# Patient Record
Sex: Female | Born: 1935 | Race: White | Hispanic: No | Smoking: Former smoker
Health system: Southern US, Community
[De-identification: ages and names within clinical notes are randomized; demographics above are authoritative.]

## PROBLEM LIST (undated history)

## (undated) DIAGNOSIS — I10 Essential (primary) hypertension: Secondary | ICD-10-CM

## (undated) DIAGNOSIS — H9319 Tinnitus, unspecified ear: Secondary | ICD-10-CM

## (undated) DIAGNOSIS — C50919 Malignant neoplasm of unspecified site of unspecified female breast: Secondary | ICD-10-CM

## (undated) DIAGNOSIS — M1990 Unspecified osteoarthritis, unspecified site: Secondary | ICD-10-CM

## (undated) DIAGNOSIS — M810 Age-related osteoporosis without current pathological fracture: Secondary | ICD-10-CM

## (undated) DIAGNOSIS — C801 Malignant (primary) neoplasm, unspecified: Secondary | ICD-10-CM

## (undated) DIAGNOSIS — I639 Cerebral infarction, unspecified: Secondary | ICD-10-CM

## (undated) DIAGNOSIS — IMO0002 Reserved for concepts with insufficient information to code with codable children: Secondary | ICD-10-CM

## (undated) DIAGNOSIS — I219 Acute myocardial infarction, unspecified: Secondary | ICD-10-CM

## (undated) DIAGNOSIS — F329 Major depressive disorder, single episode, unspecified: Secondary | ICD-10-CM

## (undated) DIAGNOSIS — I890 Lymphedema, not elsewhere classified: Secondary | ICD-10-CM

## (undated) DIAGNOSIS — N189 Chronic kidney disease, unspecified: Secondary | ICD-10-CM

## (undated) DIAGNOSIS — Z9221 Personal history of antineoplastic chemotherapy: Secondary | ICD-10-CM

## (undated) DIAGNOSIS — F419 Anxiety disorder, unspecified: Secondary | ICD-10-CM

## (undated) DIAGNOSIS — Z923 Personal history of irradiation: Secondary | ICD-10-CM

## (undated) DIAGNOSIS — K219 Gastro-esophageal reflux disease without esophagitis: Secondary | ICD-10-CM

## (undated) DIAGNOSIS — F32A Depression, unspecified: Secondary | ICD-10-CM

## (undated) DIAGNOSIS — R011 Cardiac murmur, unspecified: Secondary | ICD-10-CM

## (undated) HISTORY — PX: ABDOMINAL HYSTERECTOMY: SHX81

## (undated) HISTORY — DX: Malignant (primary) neoplasm, unspecified: C80.1

## (undated) HISTORY — PX: PORT-A-CATH REMOVAL: SHX5289

## (undated) HISTORY — PX: BREAST CYST EXCISION: SHX579

## (undated) HISTORY — DX: Unspecified osteoarthritis, unspecified site: M19.90

## (undated) HISTORY — PX: CARPAL TUNNEL RELEASE: SHX101

## (undated) HISTORY — PX: CHOLECYSTECTOMY: SHX55

## (undated) HISTORY — DX: Chronic kidney disease, unspecified: N18.9

## (undated) HISTORY — PX: BREAST BIOPSY: SHX20

## (undated) HISTORY — DX: Cardiac murmur, unspecified: R01.1

## (undated) HISTORY — DX: Reserved for concepts with insufficient information to code with codable children: IMO0002

## (undated) HISTORY — DX: Lymphedema, not elsewhere classified: I89.0

## (undated) HISTORY — DX: Malignant neoplasm of unspecified site of unspecified female breast: C50.919

## (undated) HISTORY — DX: Essential (primary) hypertension: I10

## (undated) HISTORY — PX: PARTIAL HYSTERECTOMY: SHX80

---

## 2005-06-05 ENCOUNTER — Ambulatory Visit: Payer: Self-pay | Admitting: Internal Medicine

## 2005-06-08 ENCOUNTER — Ambulatory Visit: Payer: Self-pay | Admitting: Internal Medicine

## 2005-06-15 ENCOUNTER — Ambulatory Visit: Payer: Self-pay | Admitting: Gastroenterology

## 2005-07-19 ENCOUNTER — Ambulatory Visit: Payer: Self-pay | Admitting: General Surgery

## 2005-12-21 ENCOUNTER — Ambulatory Visit: Payer: Self-pay | Admitting: Internal Medicine

## 2006-06-13 ENCOUNTER — Ambulatory Visit: Payer: Self-pay | Admitting: Internal Medicine

## 2006-09-10 ENCOUNTER — Ambulatory Visit: Payer: Self-pay

## 2006-12-04 ENCOUNTER — Ambulatory Visit: Payer: Self-pay | Admitting: Internal Medicine

## 2006-12-24 HISTORY — PX: JOINT REPLACEMENT: SHX530

## 2007-03-17 ENCOUNTER — Ambulatory Visit: Payer: Self-pay | Admitting: General Practice

## 2007-03-17 ENCOUNTER — Other Ambulatory Visit: Payer: Self-pay

## 2007-03-26 ENCOUNTER — Other Ambulatory Visit: Payer: Self-pay

## 2007-03-26 ENCOUNTER — Inpatient Hospital Stay: Payer: Self-pay | Admitting: General Practice

## 2007-11-13 ENCOUNTER — Ambulatory Visit: Payer: Self-pay | Admitting: General Practice

## 2008-01-29 ENCOUNTER — Ambulatory Visit: Payer: Self-pay | Admitting: Internal Medicine

## 2008-11-01 ENCOUNTER — Ambulatory Visit: Payer: Self-pay | Admitting: Gastroenterology

## 2008-12-24 DIAGNOSIS — C50919 Malignant neoplasm of unspecified site of unspecified female breast: Secondary | ICD-10-CM

## 2008-12-24 DIAGNOSIS — IMO0001 Reserved for inherently not codable concepts without codable children: Secondary | ICD-10-CM

## 2008-12-24 DIAGNOSIS — C801 Malignant (primary) neoplasm, unspecified: Secondary | ICD-10-CM

## 2008-12-24 HISTORY — DX: Reserved for inherently not codable concepts without codable children: IMO0001

## 2008-12-24 HISTORY — DX: Malignant (primary) neoplasm, unspecified: C80.1

## 2008-12-24 HISTORY — PX: BREAST LUMPECTOMY: SHX2

## 2008-12-24 HISTORY — PX: BREAST BIOPSY: SHX20

## 2008-12-24 HISTORY — DX: Malignant neoplasm of unspecified site of unspecified female breast: C50.919

## 2009-02-01 ENCOUNTER — Ambulatory Visit: Payer: Self-pay | Admitting: Internal Medicine

## 2009-02-09 ENCOUNTER — Ambulatory Visit: Payer: Self-pay | Admitting: Internal Medicine

## 2009-02-21 ENCOUNTER — Ambulatory Visit: Payer: Self-pay | Admitting: Internal Medicine

## 2009-02-28 ENCOUNTER — Ambulatory Visit: Payer: Self-pay | Admitting: General Surgery

## 2009-03-02 ENCOUNTER — Ambulatory Visit: Payer: Self-pay | Admitting: General Surgery

## 2009-03-09 ENCOUNTER — Ambulatory Visit: Payer: Self-pay | Admitting: General Surgery

## 2009-03-18 ENCOUNTER — Ambulatory Visit: Payer: Self-pay | Admitting: Internal Medicine

## 2009-03-24 ENCOUNTER — Ambulatory Visit: Payer: Self-pay | Admitting: Internal Medicine

## 2009-04-01 ENCOUNTER — Ambulatory Visit: Payer: Self-pay | Admitting: General Surgery

## 2009-04-23 ENCOUNTER — Ambulatory Visit: Payer: Self-pay | Admitting: Internal Medicine

## 2009-05-13 ENCOUNTER — Inpatient Hospital Stay: Payer: Self-pay | Admitting: Internal Medicine

## 2009-05-24 ENCOUNTER — Ambulatory Visit: Payer: Self-pay | Admitting: Internal Medicine

## 2009-06-23 ENCOUNTER — Ambulatory Visit: Payer: Self-pay | Admitting: Internal Medicine

## 2009-07-24 ENCOUNTER — Ambulatory Visit: Payer: Self-pay | Admitting: Internal Medicine

## 2009-08-16 ENCOUNTER — Inpatient Hospital Stay: Payer: Self-pay | Admitting: Internal Medicine

## 2009-08-24 ENCOUNTER — Ambulatory Visit: Payer: Self-pay | Admitting: Internal Medicine

## 2009-09-07 ENCOUNTER — Ambulatory Visit: Payer: Self-pay | Admitting: General Surgery

## 2009-09-23 ENCOUNTER — Ambulatory Visit: Payer: Self-pay | Admitting: Internal Medicine

## 2009-10-21 IMAGING — US US EXTREM LOW VENOUS BILAT
1 series · 17 of 24 positions shown · non-contrast
Comparison: none

REASON FOR EXAM: bilateral lower extremity swelling  CALL report [id number]
 [id number]
COMMENTS:

[Series 1: us extrem low venous bilat · 17 of 56 slices shown]
[im 1/56]
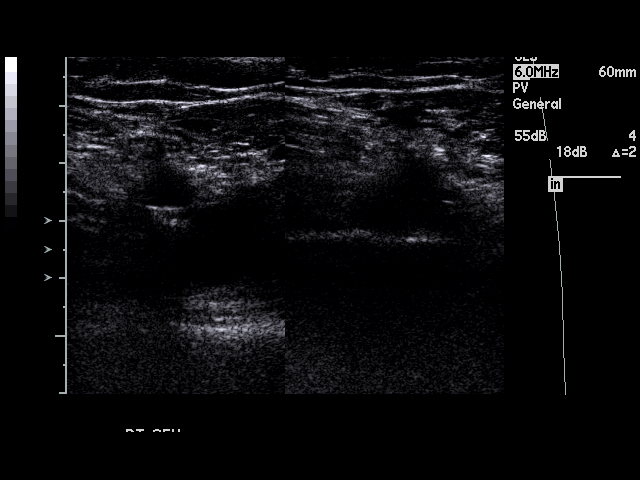
[im 5/56]
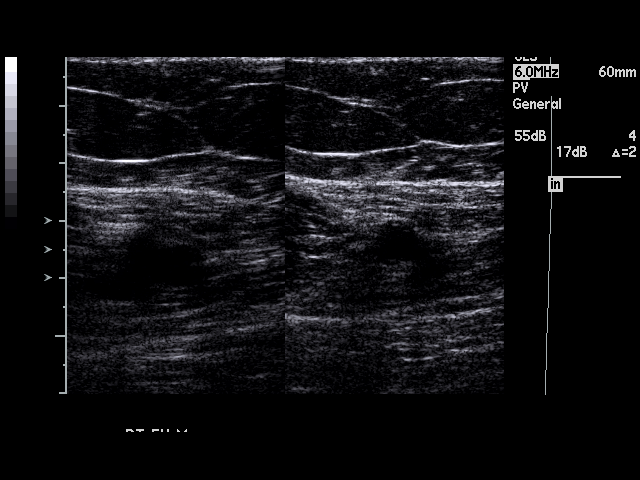
[im 8/56]
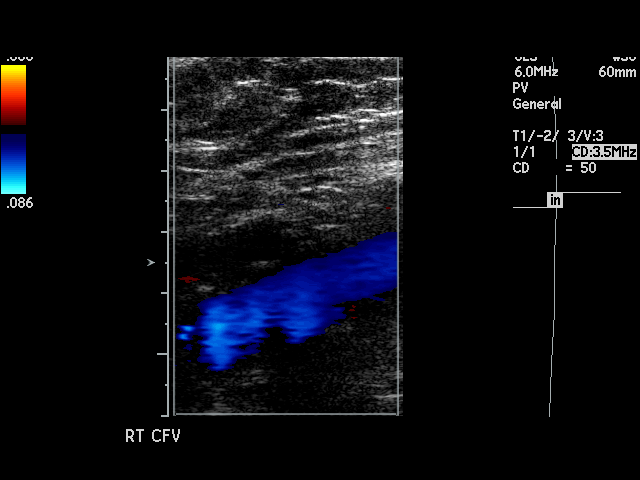
[im 10/56]
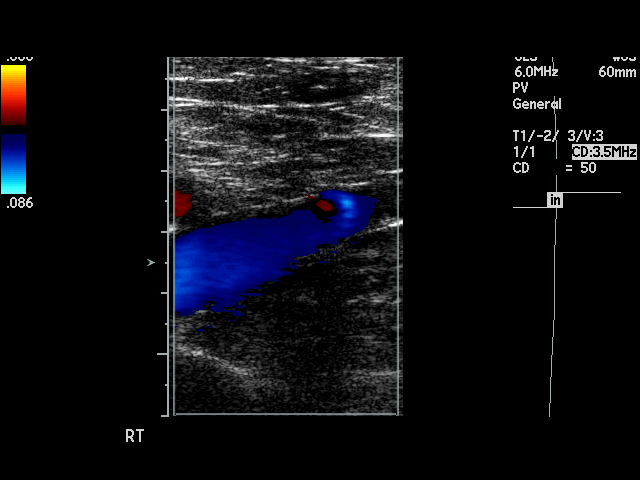
[im 15/56]
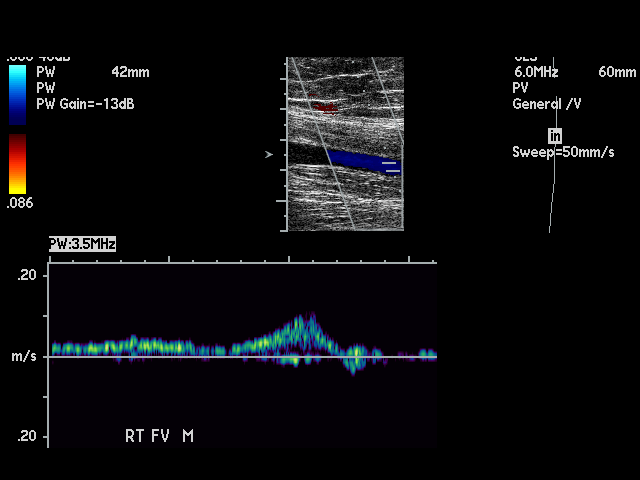
[im 17/56]
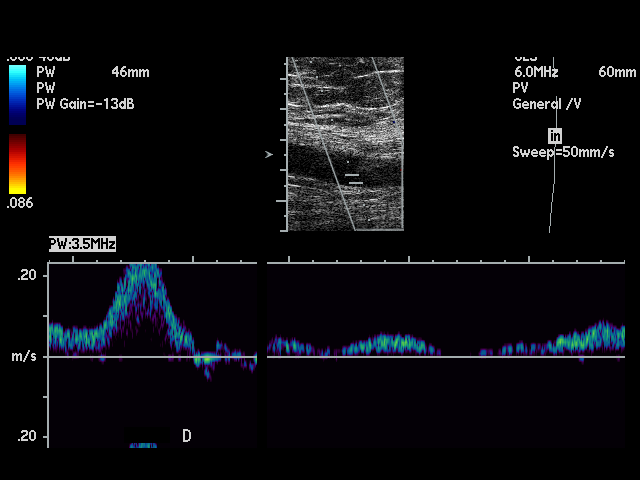
[im 22/56]
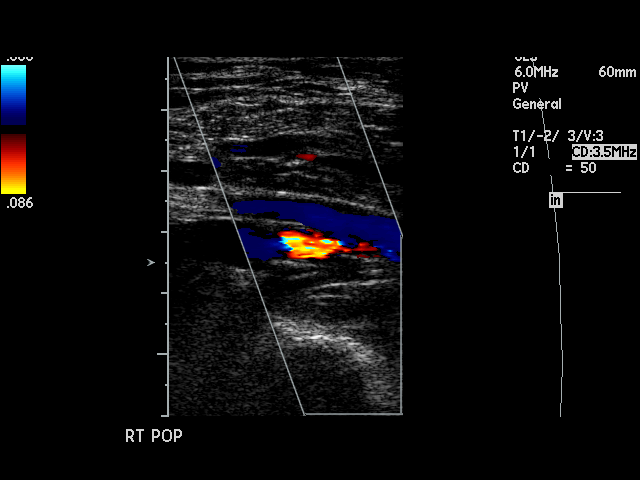
[im 24/56]
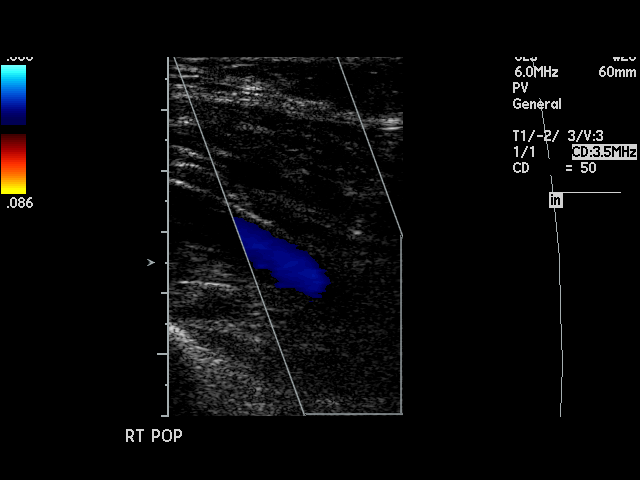
[im 29/56]
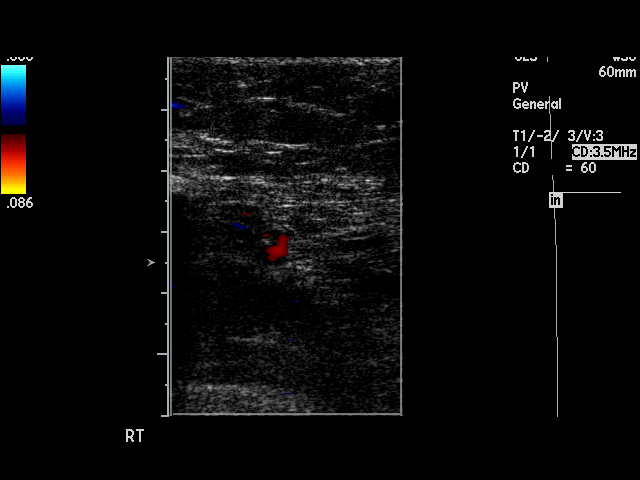
[im 32/56]
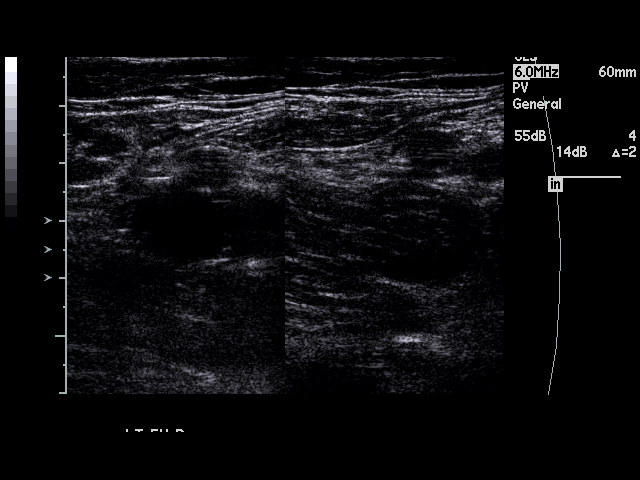
[im 34/56]
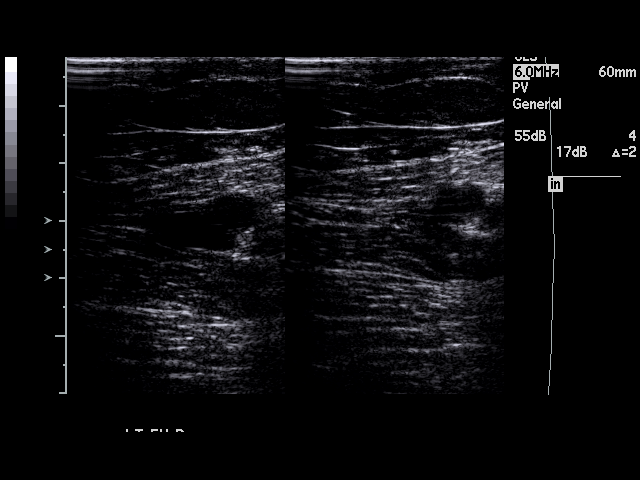
[im 39/56]
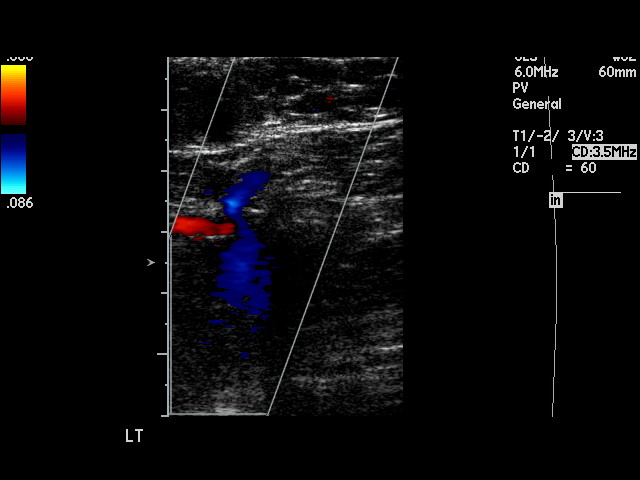
[im 41/56]
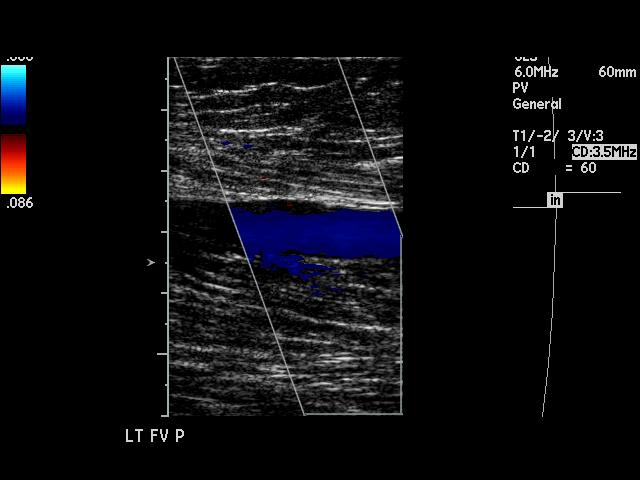
[im 46/56]
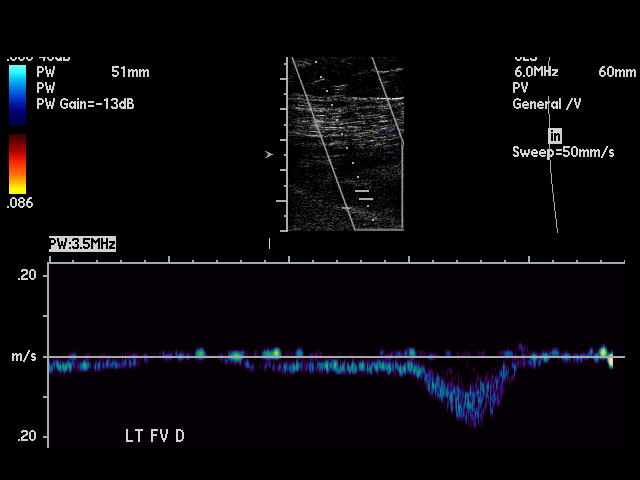
[im 48/56]
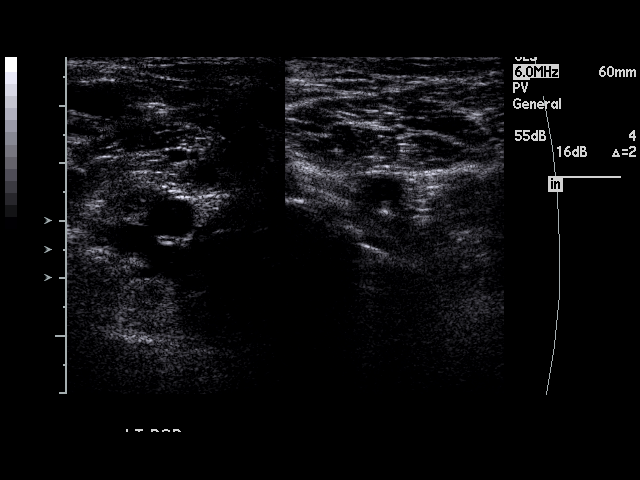
[im 51/56]
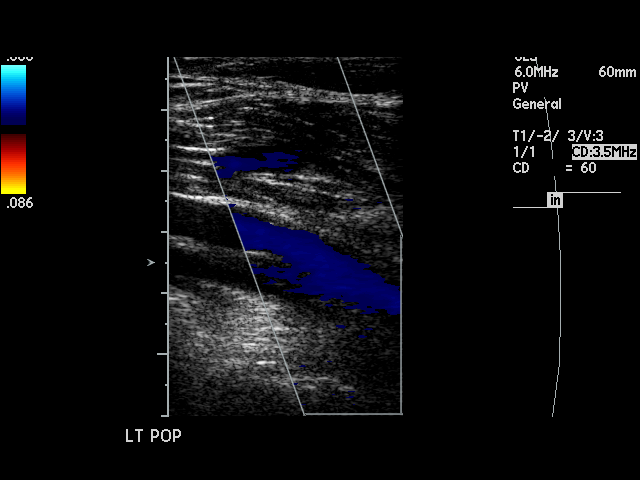
[im 56/56]
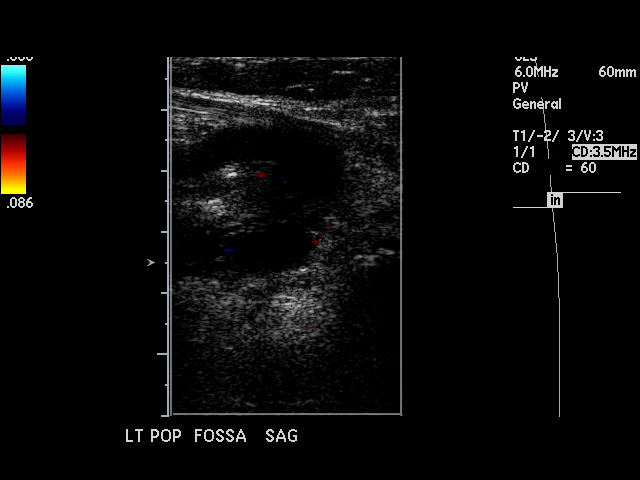

[17 of 24 positions shown; findings below may reference images not displayed]

PROCEDURE:     US  - US DOPPLER LOW EXTR BILATERAL  - April 22, 2009  [DATE]

RESULT:     The augmentation flow waveforms bilaterally are normal in
appearance. The femoral and popliteal veins bilaterally show complete
compressibility throughout their course. Doppler examination shows no
occlusion or evidence for deep vein thrombosis.

There is a 2.62 cm hypoechoic area at the popliteal fossa on the left
consistent with a Baker's cyst.
IMPRESSION: 1. No deep vein thrombosis is identified.
2. A cystic structure is noted at the left popliteal fossa consistent with a
Baker's cyst.

## 2009-10-24 ENCOUNTER — Ambulatory Visit: Payer: Self-pay | Admitting: Internal Medicine

## 2009-11-23 ENCOUNTER — Ambulatory Visit: Payer: Self-pay | Admitting: Internal Medicine

## 2009-12-24 ENCOUNTER — Ambulatory Visit: Payer: Self-pay | Admitting: Internal Medicine

## 2010-01-24 ENCOUNTER — Ambulatory Visit: Payer: Self-pay | Admitting: Internal Medicine

## 2010-02-21 ENCOUNTER — Ambulatory Visit: Payer: Self-pay | Admitting: Internal Medicine

## 2010-02-24 ENCOUNTER — Ambulatory Visit: Payer: Self-pay | Admitting: General Surgery

## 2010-02-25 ENCOUNTER — Ambulatory Visit: Payer: Self-pay | Admitting: Internal Medicine

## 2010-03-24 ENCOUNTER — Ambulatory Visit: Payer: Self-pay | Admitting: Internal Medicine

## 2010-04-09 ENCOUNTER — Ambulatory Visit: Payer: Self-pay | Admitting: Internal Medicine

## 2010-04-10 ENCOUNTER — Ambulatory Visit: Payer: Self-pay | Admitting: Internal Medicine

## 2010-04-14 ENCOUNTER — Emergency Department: Payer: Self-pay

## 2010-04-23 ENCOUNTER — Ambulatory Visit: Payer: Self-pay | Admitting: Internal Medicine

## 2010-05-24 ENCOUNTER — Ambulatory Visit: Payer: Self-pay | Admitting: Internal Medicine

## 2010-06-23 ENCOUNTER — Ambulatory Visit: Payer: Self-pay | Admitting: Internal Medicine

## 2010-07-24 ENCOUNTER — Ambulatory Visit: Payer: Self-pay | Admitting: Internal Medicine

## 2010-08-09 ENCOUNTER — Encounter: Admission: RE | Admit: 2010-08-09 | Discharge: 2010-10-23 | Payer: Self-pay

## 2010-08-14 ENCOUNTER — Ambulatory Visit: Payer: Self-pay | Admitting: Internal Medicine

## 2010-08-16 ENCOUNTER — Ambulatory Visit: Payer: Self-pay | Admitting: Internal Medicine

## 2010-08-22 ENCOUNTER — Ambulatory Visit: Payer: Self-pay | Admitting: General Surgery

## 2010-09-05 ENCOUNTER — Ambulatory Visit: Payer: Self-pay | Admitting: General Surgery

## 2010-09-18 ENCOUNTER — Ambulatory Visit: Payer: Self-pay | Admitting: Internal Medicine

## 2010-09-23 ENCOUNTER — Ambulatory Visit: Payer: Self-pay | Admitting: Internal Medicine

## 2010-11-10 ENCOUNTER — Ambulatory Visit: Payer: Self-pay | Admitting: Internal Medicine

## 2010-11-23 ENCOUNTER — Ambulatory Visit: Payer: Self-pay | Admitting: Internal Medicine

## 2010-12-24 ENCOUNTER — Ambulatory Visit: Payer: Self-pay | Admitting: Internal Medicine

## 2011-01-16 ENCOUNTER — Ambulatory Visit: Payer: Self-pay

## 2011-03-01 ENCOUNTER — Ambulatory Visit: Payer: Self-pay | Admitting: General Surgery

## 2011-03-19 ENCOUNTER — Encounter: Payer: Self-pay | Admitting: General Surgery

## 2011-03-25 ENCOUNTER — Encounter: Payer: Self-pay | Admitting: General Surgery

## 2011-04-25 ENCOUNTER — Encounter: Payer: Self-pay | Admitting: General Surgery

## 2011-05-11 ENCOUNTER — Ambulatory Visit: Payer: Self-pay | Admitting: Internal Medicine

## 2011-05-22 ENCOUNTER — Ambulatory Visit: Payer: Self-pay | Admitting: Internal Medicine

## 2011-05-25 ENCOUNTER — Ambulatory Visit: Payer: Self-pay | Admitting: Internal Medicine

## 2011-05-25 ENCOUNTER — Encounter: Payer: Self-pay | Admitting: General Surgery

## 2011-09-06 ENCOUNTER — Ambulatory Visit: Payer: Self-pay | Admitting: General Surgery

## 2011-09-17 ENCOUNTER — Ambulatory Visit: Payer: Self-pay | Admitting: General Surgery

## 2011-10-13 ENCOUNTER — Ambulatory Visit: Payer: Self-pay

## 2011-11-12 ENCOUNTER — Ambulatory Visit: Payer: Self-pay | Admitting: Internal Medicine

## 2011-11-24 ENCOUNTER — Ambulatory Visit: Payer: Self-pay | Admitting: Internal Medicine

## 2011-12-11 ENCOUNTER — Encounter: Payer: Self-pay | Admitting: Internal Medicine

## 2011-12-25 ENCOUNTER — Encounter: Payer: Self-pay | Admitting: Internal Medicine

## 2012-01-25 ENCOUNTER — Encounter: Payer: Self-pay | Admitting: Internal Medicine

## 2012-03-31 ENCOUNTER — Ambulatory Visit: Payer: Self-pay | Admitting: General Surgery

## 2012-05-23 ENCOUNTER — Ambulatory Visit: Payer: Self-pay | Admitting: Internal Medicine

## 2012-05-24 ENCOUNTER — Ambulatory Visit: Payer: Self-pay | Admitting: Internal Medicine

## 2012-05-26 LAB — HEPATIC FUNCTION PANEL A (ARMC)
Albumin: 4.1 g/dL (ref 3.4–5.0)
Alkaline Phosphatase: 81 U/L (ref 50–136)
Total Protein: 7 g/dL (ref 6.4–8.2)

## 2012-05-26 LAB — CBC CANCER CENTER
Basophil %: 0.7 %
HCT: 36.7 % (ref 35.0–47.0)
HGB: 12.7 g/dL (ref 12.0–16.0)
Lymphocyte #: 1.3 x10 3/mm (ref 1.0–3.6)
Lymphocyte %: 30.5 %
MCH: 34.2 pg — ABNORMAL HIGH (ref 26.0–34.0)
MCHC: 34.6 g/dL (ref 32.0–36.0)
MCV: 99 fL (ref 80–100)
Monocyte %: 6.9 %
RBC: 3.71 10*6/uL — ABNORMAL LOW (ref 3.80–5.20)
RDW: 12.5 % (ref 11.5–14.5)

## 2012-05-26 LAB — CREATININE, SERUM
Creatinine: 1.65 mg/dL — ABNORMAL HIGH (ref 0.60–1.30)
EGFR (African American): 35 — ABNORMAL LOW

## 2012-05-26 LAB — RETICULOCYTES: Reticulocyte: 1.5 % (ref 0.5–1.5)

## 2012-05-27 LAB — CANCER ANTIGEN 27.29: CA 27.29: 25.2 U/mL (ref 0.0–38.6)

## 2012-06-23 ENCOUNTER — Ambulatory Visit: Payer: Self-pay | Admitting: Internal Medicine

## 2012-11-25 ENCOUNTER — Ambulatory Visit: Payer: Self-pay | Admitting: Internal Medicine

## 2012-11-25 LAB — CREATININE, SERUM
EGFR (African American): 35 — ABNORMAL LOW
EGFR (Non-African Amer.): 30 — ABNORMAL LOW

## 2012-11-25 LAB — HEPATIC FUNCTION PANEL A (ARMC)
Bilirubin, Direct: 0.1 mg/dL (ref 0.00–0.20)
SGOT(AST): 26 U/L (ref 15–37)
SGPT (ALT): 26 U/L (ref 12–78)
Total Protein: 6.6 g/dL (ref 6.4–8.2)

## 2012-11-25 LAB — CBC CANCER CENTER
Basophil %: 0.7 %
Eosinophil #: 0.2 x10 3/mm (ref 0.0–0.7)
Eosinophil %: 5 %
HGB: 12.6 g/dL (ref 12.0–16.0)
Lymphocyte #: 1.5 x10 3/mm (ref 1.0–3.6)
MCH: 34.7 pg — ABNORMAL HIGH (ref 26.0–34.0)
MCHC: 35.7 g/dL (ref 32.0–36.0)
MCV: 97 fL (ref 80–100)
Monocyte #: 0.3 x10 3/mm (ref 0.2–0.9)
Neutrophil #: 2.3 x10 3/mm (ref 1.4–6.5)
Neutrophil %: 52.2 %
RBC: 3.64 10*6/uL — ABNORMAL LOW (ref 3.80–5.20)

## 2012-11-26 LAB — CANCER ANTIGEN 27.29: CA 27.29: 25.1 U/mL (ref 0.0–38.6)

## 2012-12-24 ENCOUNTER — Ambulatory Visit: Payer: Self-pay | Admitting: Internal Medicine

## 2012-12-24 DIAGNOSIS — N189 Chronic kidney disease, unspecified: Secondary | ICD-10-CM

## 2012-12-24 HISTORY — DX: Chronic kidney disease, unspecified: N18.9

## 2013-02-03 ENCOUNTER — Ambulatory Visit: Payer: Self-pay | Admitting: Internal Medicine

## 2013-04-01 ENCOUNTER — Ambulatory Visit: Payer: Self-pay | Admitting: Internal Medicine

## 2013-04-07 ENCOUNTER — Encounter: Payer: Self-pay | Admitting: General Surgery

## 2013-04-07 ENCOUNTER — Ambulatory Visit (INDEPENDENT_AMBULATORY_CARE_PROVIDER_SITE_OTHER): Payer: Medicare Other | Admitting: General Surgery

## 2013-04-07 ENCOUNTER — Other Ambulatory Visit: Payer: Self-pay

## 2013-04-07 VITALS — BP 110/70 | HR 72 | Resp 16 | Ht 64.0 in | Wt 186.0 lb

## 2013-04-07 DIAGNOSIS — Z853 Personal history of malignant neoplasm of breast: Secondary | ICD-10-CM

## 2013-04-07 DIAGNOSIS — N6019 Diffuse cystic mastopathy of unspecified breast: Secondary | ICD-10-CM

## 2013-04-07 DIAGNOSIS — C50919 Malignant neoplasm of unspecified site of unspecified female breast: Secondary | ICD-10-CM

## 2013-04-07 DIAGNOSIS — N641 Fat necrosis of breast: Secondary | ICD-10-CM

## 2013-04-07 DIAGNOSIS — C50912 Malignant neoplasm of unspecified site of left female breast: Secondary | ICD-10-CM

## 2013-04-07 NOTE — Patient Instructions (Addendum)
Continue to monitor and perform self breast checks
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
## 2013-04-08 ENCOUNTER — Encounter: Payer: Self-pay | Admitting: General Surgery

## 2013-04-08 DIAGNOSIS — C50919 Malignant neoplasm of unspecified site of unspecified female breast: Secondary | ICD-10-CM | POA: Insufficient documentation

## 2013-04-13 ENCOUNTER — Ambulatory Visit: Payer: Self-pay | Admitting: Internal Medicine

## 2013-04-15 ENCOUNTER — Ambulatory Visit: Payer: Self-pay | Admitting: General Surgery

## 2013-04-23 ENCOUNTER — Ambulatory Visit: Payer: Self-pay | Admitting: Internal Medicine

## 2013-05-19 ENCOUNTER — Telehealth: Payer: Self-pay | Admitting: *Deleted

## 2013-05-19 NOTE — Telephone Encounter (Signed)
Notified patient as instructed, patient pleased. Discussed follow-up appointments, patient agrees appt for June 18th at 4:15

## 2013-05-19 NOTE — Telephone Encounter (Signed)
Message copied by Currie Paris on Tue May 19, 2013 11:53 AM ------      Message from: [name]      Created: Tue May 19, 2013 10:53 AM       Reassure patient re: breast. Likely ongoing inflammation from injury (clippers) and biopsy. Local measures for comfort.  Arrange for a f/u appt in one month, earlier if redness worsens. (week 6/2 if needed).  ------

## 2013-05-19 NOTE — Telephone Encounter (Signed)
Phone call from pt, states she had a left breast biopsy done 04-07-13 (fat necrosis).  She states that the area since Thursday has been red (quater size) and warm and that the knot that was there may be a tiny bit larger.  She denies pain, fever and chills. Advised to use heat or ice and that I would let Dr Lemar Livings know and call her if any addition treatment is necessary.

## 2013-05-24 ENCOUNTER — Ambulatory Visit: Payer: Self-pay | Admitting: Internal Medicine

## 2013-06-03 LAB — CBC CANCER CENTER
HGB: 13.1 g/dL (ref 12.0–16.0)
Lymphocyte %: 27 %
MCHC: 35.9 g/dL (ref 32.0–36.0)
MCV: 97 fL (ref 80–100)
Monocyte #: 0.3 x10 3/mm (ref 0.2–0.9)
Monocyte %: 5.6 %
Neutrophil %: 65.7 %
Platelet: 151 x10 3/mm (ref 150–440)
RBC: 3.77 10*6/uL — ABNORMAL LOW (ref 3.80–5.20)
RDW: 11.7 % (ref 11.5–14.5)
WBC: 5.4 x10 3/mm (ref 3.6–11.0)

## 2013-06-03 LAB — HEPATIC FUNCTION PANEL A (ARMC)
Albumin: 3.9 g/dL (ref 3.4–5.0)
Bilirubin, Direct: 0.1 mg/dL (ref 0.00–0.20)
SGPT (ALT): 32 U/L (ref 12–78)
Total Protein: 6.7 g/dL (ref 6.4–8.2)

## 2013-06-03 LAB — CREATININE, SERUM: Creatinine: 2.09 mg/dL — ABNORMAL HIGH (ref 0.60–1.30)

## 2013-06-04 LAB — CANCER ANTIGEN 27.29: CA 27.29: 22 U/mL (ref 0.0–38.6)

## 2013-06-10 ENCOUNTER — Ambulatory Visit: Payer: Medicare Other | Admitting: General Surgery

## 2013-06-23 ENCOUNTER — Ambulatory Visit: Payer: Self-pay | Admitting: Internal Medicine

## 2013-06-24 ENCOUNTER — Ambulatory Visit: Payer: Medicare Other | Admitting: General Surgery

## 2013-07-06 ENCOUNTER — Encounter: Payer: Self-pay | Admitting: General Surgery

## 2013-07-06 ENCOUNTER — Ambulatory Visit (INDEPENDENT_AMBULATORY_CARE_PROVIDER_SITE_OTHER): Payer: Medicare Other | Admitting: General Surgery

## 2013-07-06 VITALS — BP 130/75 | HR 74 | Resp 14 | Ht 65.0 in | Wt 188.0 lb

## 2013-07-06 DIAGNOSIS — Z853 Personal history of malignant neoplasm of breast: Secondary | ICD-10-CM

## 2013-07-06 NOTE — Progress Notes (Signed)
Patient ID: Hailey Ellison, female   DOB: [birth year], 78 y.o.   MRN: [id number]  Chief Complaint  Patient presents with  . Follow-up    breast biopsy    HPI Hailey Ellison is a 78 y.o. female here today for followup of a left breast biopsy done on 04/07/13.Patient states no new breast problems. HPI  Past Medical History  Diagnosis Date  . Hypertension   . Murmur   . Radiation 2010    with chemo  . Arthritis   . Cancer 2010    left breast  . Lymphedema     Past Surgical History  Procedure Laterality Date  . Partial hysterectomy  1980's  . Carpal tunnel release Bilateral   . Port-a-cath removal    . Cholecystectomy    . Joint replacement Left 2008  . Breast biopsy Left 2010  . Breast cyst excision    . Breast biopsy Right   . Breast lumpectomy Left 2010    Family History  Problem Relation Age of Onset  . Cancer Sister     breast  . Cancer Sister     kidney    Social History History  Substance Use Topics  . Smoking status: Former Games developer  . Smokeless tobacco: Not on file  . Alcohol Use: No    Allergies  Allergen Reactions  . Benadryl (Diphenhydramine Hcl) Hives  . Tape   . Tegaderm Ag Mesh (Silver)   . Neosporin (Neomycin-Bacitracin Zn-Polymyx) Rash    Current Outpatient Prescriptions  Medication Sig Dispense Refill  . anastrozole (ARIMIDEX) 1 MG tablet Take 1 mg by mouth daily.      . calcium citrate-vitamin D (CITRACAL+D) 315-200 MG-UNIT per tablet Take 1 tablet by mouth 2 (two) times daily.      Marland Kitchen dipyridamole-aspirin (AGGRENOX) 200-25 MG per 12 hr capsule Take 1 capsule by mouth 2 (two) times daily.      Marland Kitchen glucosamine-chondroitin 500-400 MG tablet Take 2 tablets by mouth daily.      . hydrochlorothiazide (HYDRODIURIL) 25 MG tablet Take 25 mg by mouth daily.      . lansoprazole (PREVACID SOLUTAB) 30 MG disintegrating tablet Take 30 mg by mouth daily.      . Multiple Vitamin (MULTIVITAMIN WITH MINERALS) TABS Take 1 tablet by mouth daily.      . valsartan  (DIOVAN) 320 MG tablet Take 320 mg by mouth daily.      . verapamil (CALAN) 120 MG tablet Take 60 mg by mouth daily.       No current facility-administered medications for this visit.    Review of Systems Review of Systems  Constitutional: Negative.   Respiratory: Negative.   Cardiovascular: Negative.     Blood pressure 130/75, pulse 74, resp. rate 14, height 5\' 5"  (1.651 m), weight 188 lb (85.276 kg).  Physical Exam Physical Exam  Constitutional: She is oriented to person, place, and time. She appears well-developed and well-nourished.  Neck: Neck supple.  Cardiovascular: Normal rate and regular rhythm.   Murmur heard.  Systolic murmur is present with a grade of 4/6  Pulmonary/Chest: Breath sounds normal. Right breast exhibits no inverted nipple, no mass, no nipple discharge, no skin change and no tenderness. Left breast exhibits no inverted nipple, no mass, no nipple discharge, no skin change and no tenderness.  Left breast incision site well healed.  Lymphadenopathy:    She has no cervical adenopathy.  Neurological: She is alert and oriented to person, place, and time.  Skin: Skin is warm  and dry.  Telangectasias were appreciated in the axilla. Moderate firmness superior to wide excision site is unchanged from past exams. Redness previously identified has resolved.  Data Reviewed No new data.  Assessment    Benign breast exam.     Plan    Plans will be for a followup examination left breast mammogram in 3 months.        Hailey Ellison 07/07/2013, 9:28 PM

## 2013-07-06 NOTE — Patient Instructions (Addendum)
Patient to return in three months.  

## 2013-07-07 ENCOUNTER — Encounter: Payer: Self-pay | Admitting: General Surgery

## 2013-07-24 ENCOUNTER — Ambulatory Visit: Payer: Self-pay | Admitting: Internal Medicine

## 2013-08-24 ENCOUNTER — Ambulatory Visit: Payer: Self-pay | Admitting: Internal Medicine

## 2013-09-24 ENCOUNTER — Ambulatory Visit: Payer: Self-pay | Admitting: Internal Medicine

## 2013-10-06 ENCOUNTER — Ambulatory Visit: Payer: Self-pay | Admitting: General Surgery

## 2013-10-06 ENCOUNTER — Encounter: Payer: Self-pay | Admitting: General Surgery

## 2013-10-20 ENCOUNTER — Ambulatory Visit: Payer: Medicare Other | Admitting: General Surgery

## 2013-10-24 ENCOUNTER — Ambulatory Visit: Payer: Self-pay | Admitting: Internal Medicine

## 2013-11-05 ENCOUNTER — Encounter: Payer: Self-pay | Admitting: General Surgery

## 2013-11-05 ENCOUNTER — Ambulatory Visit (INDEPENDENT_AMBULATORY_CARE_PROVIDER_SITE_OTHER): Payer: Medicare Other | Admitting: General Surgery

## 2013-11-05 VITALS — BP 140/68 | HR 74 | Resp 16 | Ht 65.0 in | Wt 179.0 lb

## 2013-11-05 DIAGNOSIS — Z853 Personal history of malignant neoplasm of breast: Secondary | ICD-10-CM

## 2013-11-05 NOTE — Progress Notes (Signed)
Patient ID: Hailey Ellison, female   DOB: 02/20/1935, 78 y.o.   MRN: [id number]  Chief Complaint  Patient presents with  . Follow-up    mammogram    HPI Hailey Ellison is a 78 y.o. female  who presents for a breast evaluation. The most recent mammogram was done on 10/06/13 Cat 3. This is a follow up from a biopsy of the left breast. Patient does perform regular self breast checks and gets regular mammograms done. She reports no problems with her breasts. Her husband died within the last 2 months. He had significant cardiomyopathy, making use of a continuous dobutamine infusion and a left ventricular assist device.   HPI  Past Medical History  Diagnosis Date  . Hypertension   . Murmur   . Radiation 2010    with chemo  . Arthritis   . Cancer 2010    left breast  . Lymphedema     Past Surgical History  Procedure Laterality Date  . Partial hysterectomy  1980's  . Carpal tunnel release Bilateral   . Port-a-cath removal    . Cholecystectomy    . Joint replacement Left 2008  . Breast biopsy Left 2010  . Breast cyst excision    . Breast biopsy Right   . Breast lumpectomy Left 2010    Family History  Problem Relation Age of Onset  . Cancer Sister     breast  . Cancer Sister     kidney    Social History History  Substance Use Topics  . Smoking status: Former Games developer  . Smokeless tobacco: Not on file  . Alcohol Use: No    Allergies  Allergen Reactions  . Benadryl [Diphenhydramine Hcl] Hives  . Tape   . Tegaderm Ag Mesh [Silver]   . Neosporin [Neomycin-Bacitracin Zn-Polymyx] Rash    Current Outpatient Prescriptions  Medication Sig Dispense Refill  . anastrozole (ARIMIDEX) 1 MG tablet Take 1 mg by mouth daily.      . calcium citrate-vitamin D (CITRACAL+D) 315-200 MG-UNIT per tablet Take 1 tablet by mouth 2 (two) times daily.      Marland Kitchen dipyridamole-aspirin (AGGRENOX) 200-25 MG per 12 hr capsule Take 1 capsule by mouth 2 (two) times daily.      Marland Kitchen glucosamine-chondroitin  500-400 MG tablet Take 2 tablets by mouth daily.      . hydrochlorothiazide (HYDRODIURIL) 25 MG tablet Take 25 mg by mouth daily.      . lansoprazole (PREVACID SOLUTAB) 30 MG disintegrating tablet Take 30 mg by mouth daily.      . Multiple Vitamin (MULTIVITAMIN WITH MINERALS) TABS Take 1 tablet by mouth daily.      . valsartan (DIOVAN) 320 MG tablet Take 320 mg by mouth daily.      . verapamil (CALAN) 120 MG tablet Take 60 mg by mouth daily.       No current facility-administered medications for this visit.    Review of Systems Review of Systems  Constitutional: Negative.   Respiratory: Negative.   Cardiovascular: Negative.     Blood pressure 140/68, pulse 74, resp. rate 16, height 5\' 5"  (1.651 m), weight 179 lb (81.194 kg).  Physical Exam Physical Exam  Constitutional: She is oriented to person, place, and time. She appears well-developed and well-nourished.  Neck: Neck supple.  Cardiovascular:  Murmur (grade 3) heard. Pulmonary/Chest: Effort normal and breath sounds normal. Right breast exhibits no inverted nipple, no mass, no nipple discharge, no skin change and no tenderness. Left breast exhibits skin change (thickening  in the superior breast at the excision site). Left breast exhibits no inverted nipple, no mass and no tenderness.  10 cm area of TL in the left axila.  Lymphadenopathy:    She has no cervical adenopathy.  Neurological: She is alert and oriented to person, place, and time.    Data Reviewed Left breast mammogram dated October 06, 2013 showed stable scarring status post wide excision. No interval change. Biopsy clip evident. BI-RAD-3.  Assessment    Benign breast exam.     Plan    We'll plan for a 5 year post treatment exam with bilateral mammograms in 6 months.        Hailey Ellison 11/05/2013, 9:05 PM

## 2013-11-16 ENCOUNTER — Encounter: Payer: Self-pay | Admitting: General Surgery

## 2013-11-27 ENCOUNTER — Ambulatory Visit: Payer: Self-pay | Admitting: Internal Medicine

## 2013-12-04 LAB — BASIC METABOLIC PANEL
Anion Gap: 9 (ref 7–16)
BUN: 28 mg/dL — ABNORMAL HIGH (ref 7–18)
Calcium, Total: 9.1 mg/dL (ref 8.5–10.1)
Creatinine: 1.72 mg/dL — ABNORMAL HIGH (ref 0.60–1.30)
EGFR (African American): 33 — ABNORMAL LOW
Osmolality: 292 (ref 275–301)
Potassium: 4.3 mmol/L (ref 3.5–5.1)
Sodium: 143 mmol/L (ref 136–145)

## 2013-12-04 LAB — HEPATIC FUNCTION PANEL A (ARMC)
Alkaline Phosphatase: 69 U/L
Bilirubin, Direct: 0.1 mg/dL (ref 0.00–0.20)
Bilirubin,Total: 0.5 mg/dL (ref 0.2–1.0)
SGPT (ALT): 27 U/L (ref 12–78)

## 2013-12-04 LAB — CBC CANCER CENTER
Basophil #: 0 x10 3/mm (ref 0.0–0.1)
Eosinophil #: 0.1 x10 3/mm (ref 0.0–0.7)
Eosinophil %: 2.5 %
HGB: 12.6 g/dL (ref 12.0–16.0)
Lymphocyte #: 1.4 x10 3/mm (ref 1.0–3.6)
Lymphocyte %: 28.1 %
MCH: 33.5 pg (ref 26.0–34.0)
MCV: 99 fL (ref 80–100)
Monocyte #: 0.3 x10 3/mm (ref 0.2–0.9)
Monocyte %: 6.1 %
Neutrophil #: 3.1 x10 3/mm (ref 1.4–6.5)
Neutrophil %: 62.7 %
Platelet: 157 x10 3/mm (ref 150–440)
WBC: 4.9 x10 3/mm (ref 3.6–11.0)

## 2013-12-24 ENCOUNTER — Ambulatory Visit: Payer: Self-pay | Admitting: Internal Medicine

## 2014-01-25 ENCOUNTER — Ambulatory Visit: Payer: Medicare Other | Admitting: Podiatry

## 2014-02-04 ENCOUNTER — Encounter: Payer: Self-pay | Admitting: Podiatry

## 2014-02-04 ENCOUNTER — Ambulatory Visit (INDEPENDENT_AMBULATORY_CARE_PROVIDER_SITE_OTHER): Payer: Medicare Other | Admitting: Podiatry

## 2014-02-04 VITALS — BP 129/65 | HR 77 | Resp 16 | Ht 65.0 in | Wt 178.0 lb

## 2014-02-04 DIAGNOSIS — B351 Tinea unguium: Secondary | ICD-10-CM

## 2014-02-04 DIAGNOSIS — M79609 Pain in unspecified limb: Secondary | ICD-10-CM

## 2014-02-04 NOTE — Progress Notes (Signed)
   Subjective:    Patient ID: Hailey Ellison, female    DOB: [birth year], 79 y.o.   MRN: [id number]  HPI Comments: These toenails , they are discolored thick and painful.  The right great toenail hurts the most. Even the sheets at night hurts it .     Review of Systems  All other systems reviewed and are negative.       Objective:   Physical Exam: She presents today for painful elongated toenails. Vital signs are stable she is alert and oriented x3. Pulses are strongly palpable bilateral. Neurologic sensorium is intact per since once the monofilament. Deep tendon reflexes are intact bilateral muscle strength is 5 over 5 dorsiflexors plantar flexors inverters everters all intrinsic musculature is intact. Orthopedic evaluation demonstrates all joints distal to the ankle have a full range of motion without crepitation. Cutaneous evaluation demonstrates Her pulses are strong and palpable bilateral nails are thick yellow dystrophic clinically mycotic. Her hallux nail right is more tender on palpation and debridement and her other nails.        Assessment & Plan:  Assessment: Pain in limb secondary to onychomycosis 1 through 5 bilateral.  Plan: Debridement nails in thickness and length as a covered service secondary to pain reevaluate the right hallux next visit to determine if we need a topical antifungal.

## 2014-03-01 ENCOUNTER — Ambulatory Visit: Payer: Self-pay | Admitting: Internal Medicine

## 2014-03-18 ENCOUNTER — Ambulatory Visit: Payer: Self-pay | Admitting: Nephrology

## 2014-03-24 ENCOUNTER — Ambulatory Visit: Payer: Self-pay | Admitting: Internal Medicine

## 2014-05-05 ENCOUNTER — Ambulatory Visit: Payer: Self-pay | Admitting: Podiatry

## 2014-05-10 ENCOUNTER — Ambulatory Visit (INDEPENDENT_AMBULATORY_CARE_PROVIDER_SITE_OTHER): Payer: Medicare Other | Admitting: Podiatry

## 2014-05-10 VITALS — BP 113/52 | HR 86 | Resp 16

## 2014-05-10 DIAGNOSIS — M79609 Pain in unspecified limb: Secondary | ICD-10-CM

## 2014-05-10 DIAGNOSIS — B351 Tinea unguium: Secondary | ICD-10-CM

## 2014-05-10 NOTE — Progress Notes (Signed)
She presents today with a chief complaint of painful elongated toenails one through 5 bilateral. States they bother her with shoe gear.  Objective: Nails are thick yellow dystrophic clinically mycotic and pulses are palpable bilateral.  Assessment: Pain in limb secondary to onychomycosis 1 through 5 bilateral.  Plan: Debridement of nails thickness and length as a covered service secondary to pain.

## 2014-05-13 ENCOUNTER — Encounter: Payer: Self-pay | Admitting: General Surgery

## 2014-05-25 ENCOUNTER — Ambulatory Visit (INDEPENDENT_AMBULATORY_CARE_PROVIDER_SITE_OTHER): Payer: Medicare Other | Admitting: General Surgery

## 2014-05-25 ENCOUNTER — Encounter: Payer: Self-pay | Admitting: General Surgery

## 2014-05-25 VITALS — BP 162/68 | HR 74 | Resp 14 | Ht 64.5 in | Wt 179.0 lb

## 2014-05-25 DIAGNOSIS — Z853 Personal history of malignant neoplasm of breast: Secondary | ICD-10-CM

## 2014-05-25 NOTE — Patient Instructions (Signed)
Continue self breast exams. Call office for any new breast issues or concerns. 

## 2014-05-25 NOTE — Progress Notes (Signed)
Patient ID: Hailey Ellison, female   DOB: [birth year], 79 y.o.   MRN: [id number]  Chief Complaint  Patient presents with  . Follow-up    mammogram    HPI Hailey Ellison is a 79 y.o. female.  who presents for her follow up mammogram and breast evaluation. The most recent mammogram was done on 05-13-14.  Patient does perform regular self breast checks and gets regular mammograms done.  No new breast issues.  Followed by [facility] for kidney disease. Cardiologist is Dr. Nehemiah Massed.   HPI  Past Medical History  Diagnosis Date  . Hypertension   . Murmur   . Radiation 2010    with chemo  . Arthritis   . Lymphedema   . Chronic kidney disease 2014  . Cancer 2010    T1c,N2a, M0; ER 90%, PR 60%,  HER-2/neu 2+ IHC, equivocal on fish.    Past Surgical History  Procedure Laterality Date  . Partial hysterectomy  1980's  . Carpal tunnel release Bilateral   . Port-a-cath removal    . Cholecystectomy    . Joint replacement Left 2008  . Breast biopsy Left 2010  . Breast cyst excision    . Breast biopsy Right   . Breast lumpectomy Left 2010    Family History  Problem Relation Age of Onset  . Cancer Sister     breast  . Cancer Sister     kidney    Social History History  Substance Use Topics  . Smoking status: Former Research scientist (life sciences)  . Smokeless tobacco: Not on file  . Alcohol Use: No    Allergies  Allergen Reactions  . Benadryl [Diphenhydramine Hcl] Hives  . Tape   . Tegaderm Ag Mesh [Silver]   . Neosporin [Neomycin-Bacitracin Zn-Polymyx] Rash    Current Outpatient Prescriptions  Medication Sig Dispense Refill  . anastrozole (ARIMIDEX) 1 MG tablet Take 1 mg by mouth daily.      . calcium citrate-vitamin D (CITRACAL+D) 315-200 MG-UNIT per tablet Take 1 tablet by mouth 2 (two) times daily.      Marland Kitchen dipyridamole-aspirin (AGGRENOX) 200-25 MG per 12 hr capsule Take 1 capsule by mouth 2 (two) times daily.      . fluticasone (FLONASE) 50 MCG/ACT nasal spray       . glucosamine-chondroitin  500-400 MG tablet Take 2 tablets by mouth daily.      . hydrochlorothiazide (HYDRODIURIL) 25 MG tablet Take 25 mg by mouth daily.      Marland Kitchen HYDROcodone-homatropine (HYCODAN) 5-1.5 MG/5ML syrup       . lansoprazole (PREVACID SOLUTAB) 30 MG disintegrating tablet Take 30 mg by mouth daily.      . Multiple Vitamin (MULTIVITAMIN WITH MINERALS) TABS Take 1 tablet by mouth daily.      . predniSONE (STERAPRED UNI-PAK) 10 MG tablet Take by mouth daily.      . valsartan (DIOVAN) 320 MG tablet Take 320 mg by mouth daily.      . verapamil (CALAN) 120 MG tablet Take 60 mg by mouth daily.       No current facility-administered medications for this visit.    Review of Systems Review of Systems  Constitutional: Negative.   Respiratory: Negative.   Cardiovascular: Negative.     Blood pressure 162/68, pulse 74, resp. rate 14, height 5' 4.5" (1.638 m), weight 179 lb (81.194 kg).  Physical Exam Physical Exam  Constitutional: She is oriented to person, place, and time. She appears well-developed and well-nourished.  Neck: Neck supple.  Cardiovascular: Normal rate and regular rhythm.   Murmur heard.  Systolic murmur is present with a grade of 3/6  Pulmonary/Chest: Effort normal and breath sounds normal. Right breast exhibits no inverted nipple, no mass, no nipple discharge, no skin change and no tenderness. Left breast exhibits no inverted nipple, no mass, no nipple discharge, no skin change and no tenderness.    Area of thickening left breast at area of incision with telangectasia noted  Lymphadenopathy:    She has no cervical adenopathy.  Neurological: She is alert and oriented to person, place, and time.  Skin: Skin is warm and dry.    Data Reviewed Bilateral mammogram as it May 13, 2014 were reviewed  Previous studies. Multiple oil cyst. Stable postsurgical changes. BI-RAD-2.  Assessment    Benign breast exam.     Plan    We'll plan for a followup examination with bilateral mammograms in  one year.     PCP: [name] Dr. Erasmo Leventhal Taffany Heiser 05/25/2014, 9:21 PM

## 2014-07-07 ENCOUNTER — Encounter: Payer: Self-pay | Admitting: General Surgery

## 2014-07-07 ENCOUNTER — Other Ambulatory Visit: Payer: Medicare Other

## 2014-07-07 ENCOUNTER — Ambulatory Visit (INDEPENDENT_AMBULATORY_CARE_PROVIDER_SITE_OTHER): Payer: Medicare Other | Admitting: General Surgery

## 2014-07-07 VITALS — BP 130/74 | HR 76 | Resp 12 | Ht 64.5 in | Wt 181.0 lb

## 2014-07-07 DIAGNOSIS — Z853 Personal history of malignant neoplasm of breast: Secondary | ICD-10-CM

## 2014-07-07 DIAGNOSIS — N63 Unspecified lump in unspecified breast: Secondary | ICD-10-CM

## 2014-07-07 NOTE — Progress Notes (Addendum)
Patient ID: Hailey Ellison, female   DOB: Jul 08, 1935, 79 y.o.   MRN: [id number]  Chief Complaint  Patient presents with  . Breast Problem    left breast mass    HPI Hailey Ellison is a 79 y.o. female.  who presents for a left breast evaluation. Patient states she felt a lump in her left breast about two weeks ago and she thinks its gotten bigger. The week prior to this discovery she began dancing on a weekly basis. This was the first time in several years that she had been dancing, and it did involve a fair amount of upper body activity.  Her recent evaluation here in June 2015 with clinical exam and mammogram have been unremarkable.  Patient does perform regular self breast checks and gets regular mammograms done.    HPI  Past Medical History  Diagnosis Date  . Hypertension   . Murmur   . Radiation 2010    with chemo  . Arthritis   . Lymphedema   . Chronic kidney disease 2014  . Cancer 2010    T1c,N2a, M0; ER 90%, PR 60%,  HER-2/neu 2+ IHC, equivocal on fish.    Past Surgical History  Procedure Laterality Date  . Partial hysterectomy  1980's  . Carpal tunnel release Bilateral   . Port-a-cath removal    . Cholecystectomy    . Joint replacement Left 2008  . Breast biopsy Left 2010  . Breast cyst excision    . Breast biopsy Right   . Breast lumpectomy Left 2010    Family History  Problem Relation Age of Onset  . Cancer Sister     breast  . Cancer Sister     kidney    Social History History  Substance Use Topics  . Smoking status: Former Research scientist (life sciences)  . Smokeless tobacco: Not on file  . Alcohol Use: No    Allergies  Allergen Reactions  . Benadryl [Diphenhydramine Hcl] Hives  . Tape   . Tegaderm Ag Mesh [Silver]   . Neosporin [Neomycin-Bacitracin Zn-Polymyx] Rash    Current Outpatient Prescriptions  Medication Sig Dispense Refill  . anastrozole (ARIMIDEX) 1 MG tablet Take 1 mg by mouth daily.      . calcium citrate-vitamin D (CITRACAL+D) 315-200 MG-UNIT per  tablet Take 1 tablet by mouth 2 (two) times daily.      Marland Kitchen dipyridamole-aspirin (AGGRENOX) 200-25 MG per 12 hr capsule Take 1 capsule by mouth 2 (two) times daily.      . fluticasone (FLONASE) 50 MCG/ACT nasal spray       . glucosamine-chondroitin 500-400 MG tablet Take 2 tablets by mouth daily.      . hydrochlorothiazide (HYDRODIURIL) 25 MG tablet Take 25 mg by mouth daily.      Marland Kitchen HYDROcodone-homatropine (HYCODAN) 5-1.5 MG/5ML syrup       . lansoprazole (PREVACID SOLUTAB) 30 MG disintegrating tablet Take 30 mg by mouth daily.      . Multiple Vitamin (MULTIVITAMIN WITH MINERALS) TABS Take 1 tablet by mouth daily.      . predniSONE (STERAPRED UNI-PAK) 10 MG tablet Take by mouth daily.      . valsartan (DIOVAN) 320 MG tablet Take 320 mg by mouth daily.      . verapamil (CALAN) 120 MG tablet Take 60 mg by mouth daily.       No current facility-administered medications for this visit.    Review of Systems Review of Systems  Constitutional: Negative.   Respiratory: Negative.  Cardiovascular: Negative.     Blood pressure 130/74, pulse 76, resp. rate 12, height 5' 4.5" (1.638 m), weight 181 lb (82.101 kg).  Physical Exam Physical Exam  Constitutional: She is oriented to person, place, and time. She appears well-developed and well-nourished.  Eyes: Conjunctivae are normal. No scleral icterus.  Neck: Neck supple.  Cardiovascular: Normal rate and regular rhythm.   Murmur heard.  Systolic murmur is present with a grade of 3/6  Pulmonary/Chest: Effort normal and breath sounds normal. Right breast exhibits no inverted nipple, no mass, no nipple discharge, no skin change and no tenderness. Left breast exhibits mass ( 2 cm fullness above the incision). Left breast exhibits no inverted nipple, no nipple discharge, no skin change and no tenderness.    Neurological: She is alert and oriented to person, place, and time.  Skin: Skin is warm and dry.    Data Reviewed Review of her May 2015  mammogram was again unremarkable except for areas of necrosis and likely oil cyst.  Ultrasound examination of the left breast 12 cm from the nipple at the 12:00 position at the area of visible and palpable fullness showed an area of dense shadowing likely consistent with a oil cyst. This measured 0.5 x 0.8 x 1.2 cm. The patient was amenable to aspiration this was tolerated well with 1% plain Xylocaine. A small volume of thick yellow orange fluid consistent with liquefied at was noted. This was discarded.  Superior to this and the soft tissue, examining the seated position was a focal area of prominent adipose tissue measuring 0.9 x 1.9 x 2.2 cm. This thus correlates with the area of prominent visual and palpable tissue. This may be irritated fat. It is slightly hyper echoic compared to the adjacent adipose tissue. BI-RAD-3.  Due to an error is the ultrasound system, these images are scanned into the EMR, not located in the [facility] radiology program.   Assessment    Soft tissue prominence likely related to recent increase in upper extremity activity. Previously identified oil cyst.    Plan    At this time we'll plan from sedation with a repeat exam in 6 weeks, earlier if there is a dramatic change. If necessary core biopsy will be undertaken.     PCP: [name] 07/08/2014, 8:38 AM

## 2014-07-07 NOTE — Patient Instructions (Signed)
Patient to return in six weeks.  

## 2014-07-08 ENCOUNTER — Encounter: Payer: Self-pay | Admitting: General Surgery

## 2014-07-08 DIAGNOSIS — N63 Unspecified lump in unspecified breast: Secondary | ICD-10-CM | POA: Insufficient documentation

## 2014-07-13 ENCOUNTER — Ambulatory Visit: Payer: Self-pay | Admitting: Gastroenterology

## 2014-08-03 ENCOUNTER — Telehealth: Payer: Self-pay | Admitting: *Deleted

## 2014-08-03 NOTE — Telephone Encounter (Signed)
She had left a message with the answering service (she thought she had left the message for Dr. Ma Hillock). She is complaining of swelling from her elbow down to her hand. She states her hand was drastically worse overnight. She had been wearing her lymphedema sleeve. She was very upset and emotional when talking about her swelling. Appt made for 08-04-14.

## 2014-08-04 ENCOUNTER — Ambulatory Visit (INDEPENDENT_AMBULATORY_CARE_PROVIDER_SITE_OTHER): Payer: Medicare Other | Admitting: General Surgery

## 2014-08-04 ENCOUNTER — Encounter: Payer: Self-pay | Admitting: General Surgery

## 2014-08-04 VITALS — BP 130/66 | HR 82 | Resp 12 | Ht 64.0 in | Wt 182.0 lb

## 2014-08-04 DIAGNOSIS — I890 Lymphedema, not elsewhere classified: Secondary | ICD-10-CM

## 2014-08-04 DIAGNOSIS — Z853 Personal history of malignant neoplasm of breast: Secondary | ICD-10-CM

## 2014-08-04 NOTE — Patient Instructions (Signed)
Patient to return in 2 weeks  

## 2014-08-04 NOTE — Progress Notes (Signed)
Patient ID: Hailey Ellison, female   DOB: 07-31-1935, 79 y.o.   MRN: [id number]  Chief Complaint  Patient presents with  . Follow-up    edema     HPI Hailey Ellison is a 79 y.o. female here today complaining of swelling from her left  elbow down to her hand. She states her hand was drastically worse overnight. She had discontinue the use of her compressive sleeve in November as arm edema had not been an issue. She had not experienced any change or suggestion of recurrent edema the last 36 hours. She as been wearing her lymphedema sleeve since the onset of swelling. She noticed a suspected insect bite on the dorsum of her left hand yesterday.  HPI  Past Medical History  Diagnosis Date  . Hypertension   . Murmur   . Radiation 2010    with chemo  . Arthritis   . Lymphedema   . Chronic kidney disease 2014  . Cancer 2010    T1c,N2a, M0; ER 90%, PR 60%,  HER-2/neu 2+ IHC, equivocal on fish.    Past Surgical History  Procedure Laterality Date  . Partial hysterectomy  1980's  . Carpal tunnel release Bilateral   . Port-a-cath removal    . Cholecystectomy    . Joint replacement Left 2008  . Breast biopsy Left 2010  . Breast cyst excision    . Breast biopsy Right   . Breast lumpectomy Left 2010    Family History  Problem Relation Age of Onset  . Cancer Sister     breast  . Cancer Sister     kidney    Social History History  Substance Use Topics  . Smoking status: Former Research scientist (life sciences)  . Smokeless tobacco: Not on file  . Alcohol Use: No    Allergies  Allergen Reactions  . Benadryl [Diphenhydramine Hcl] Hives  . Tape   . Tegaderm Ag Mesh [Silver]   . Neosporin [Neomycin-Bacitracin Zn-Polymyx] Rash    Current Outpatient Prescriptions  Medication Sig Dispense Refill  . anastrozole (ARIMIDEX) 1 MG tablet Take 1 mg by mouth daily.      . calcium citrate-vitamin D (CITRACAL+D) 315-200 MG-UNIT per tablet Take 1 tablet by mouth 2 (two) times daily.      Marland Kitchen dipyridamole-aspirin  (AGGRENOX) 200-25 MG per 12 hr capsule Take 1 capsule by mouth 2 (two) times daily.      . fluticasone (FLONASE) 50 MCG/ACT nasal spray       . glucosamine-chondroitin 500-400 MG tablet Take 2 tablets by mouth daily.      . hydrochlorothiazide (HYDRODIURIL) 25 MG tablet Take 25 mg by mouth daily.      Marland Kitchen HYDROcodone-homatropine (HYCODAN) 5-1.5 MG/5ML syrup       . lansoprazole (PREVACID SOLUTAB) 30 MG disintegrating tablet Take 30 mg by mouth daily.      . Multiple Vitamin (MULTIVITAMIN WITH MINERALS) TABS Take 1 tablet by mouth daily.      . predniSONE (STERAPRED UNI-PAK) 10 MG tablet Take by mouth daily.      . valsartan (DIOVAN) 320 MG tablet Take 320 mg by mouth daily.      . verapamil (CALAN) 120 MG tablet Take 60 mg by mouth daily.      . cefadroxil (DURICEF) 500 MG capsule Take 1 capsule (500 mg total) by mouth 2 (two) times daily.  30 capsule  0   No current facility-administered medications for this visit.    Review of Systems Review of Systems  Constitutional: Negative.   Cardiovascular: Negative.     Blood pressure 130/66, pulse 82, resp. rate 12, height $RemoveBe'5\' 4"'[id number]$  (1.626 m), weight 182 lb (82.555 kg).  Physical Exam Physical Exam  Constitutional: She is oriented to person, place, and time. She appears well-developed and well-nourished.  Eyes: Conjunctivae are normal. No scleral icterus.  Neck: Neck supple.  Cardiovascular: Normal rate.   Murmur heard.  Systolic murmur is present with a grade of 3/6  Pulmonary/Chest: Effort normal and breath sounds normal. Right breast exhibits no inverted nipple, no mass, no nipple discharge, no skin change and no tenderness. Left breast exhibits no inverted nipple, no mass, no nipple discharge, no skin change and no tenderness.    Musculoskeletal:       Arms: Lymphadenopathy:    She has no cervical adenopathy.    She has no axillary adenopathy.  Neurological: She is alert and oriented to person, place, and time.  Skin: Skin is warm and  dry.  Tenderness in the left forearm and hand.     Data Reviewed Measurements of the upper extremity were completed at a 0.10 cm superior to olecranon process as well as 10 and 20 cm below olecranon process.  On the right side these measurements were 29.5, 26 and 19 cm rectally.  On the left side these measurements were 29, 28.5 and 21.5 cm respectively. There is a 1 inch difference in the upper extremities in the forearm area and to a lesser extent in the hand. Far more prominent than at time of her November 2015 exam.    Assessment    Sudden decompensation and left upper extremity lymphedema.     Plan    The possibility of occult infection is present, and for that reason she'll be placed on Duricef 500 mg p.o. B.i.d. For 2 week course. She will be reassessed at that time. The patient will continue to use her sleeve in the interval period    PCP: [name] 08/05/2014, 8:35 PM

## 2014-08-05 ENCOUNTER — Telehealth: Payer: Self-pay | Admitting: *Deleted

## 2014-08-05 DIAGNOSIS — I890 Lymphedema, not elsewhere classified: Secondary | ICD-10-CM | POA: Insufficient documentation

## 2014-08-05 LAB — CBC WITH DIFFERENTIAL
Basophils Absolute: 0 10*3/uL (ref 0.0–0.2)
Basos: 0 %
Eos: 3 %
Eosinophils Absolute: 0.2 10*3/uL (ref 0.0–0.4)
HEMATOCRIT: 38.7 % (ref 34.0–46.6)
Hemoglobin: 13 g/dL (ref 11.1–15.9)
Immature Grans (Abs): 0 10*3/uL (ref 0.0–0.1)
Immature Granulocytes: 0 %
Lymphocytes Absolute: 1.5 10*3/uL (ref 0.7–3.1)
Lymphs: 32 %
MCH: 33.3 pg — ABNORMAL HIGH (ref 26.6–33.0)
MCHC: 33.6 g/dL (ref 31.5–35.7)
MCV: 99 fL — AB (ref 79–97)
MONOCYTES: 8 %
Monocytes Absolute: 0.4 10*3/uL (ref 0.1–0.9)
NEUTROS ABS: 2.7 10*3/uL (ref 1.4–7.0)
Neutrophils Relative %: 57 %
Platelets: 155 10*3/uL (ref 150–379)
RBC: 3.9 x10E6/uL (ref 3.77–5.28)
RDW: 13.2 % (ref 12.3–15.4)
WBC: 4.8 10*3/uL (ref 3.4–10.8)

## 2014-08-05 MED ORDER — CEFADROXIL 500 MG PO CAPS: 500.0000 mg | ORAL_CAPSULE | Freq: Two times a day (BID) | ORAL | Status: DC

## 2014-08-05 NOTE — Telephone Encounter (Signed)
She called asking about her RX. I apologized and let her know it was sent this morning.

## 2014-08-06 ENCOUNTER — Telehealth: Payer: Self-pay | Admitting: General Surgery

## 2014-08-06 ENCOUNTER — Telehealth: Payer: Self-pay

## 2014-08-06 NOTE — Telephone Encounter (Signed)
Patient called asking about her blood work results from 08/04/14. She says her arm is still very swollen and that she has removed her lymphedema sleeve due to concerns of it cutting off her circulation due to the amount of swelling that she is having. She is very upset and emotional about what is happening. She is taking her antibiotics as prescribed and wonders if they will help with her swelling.  I spoke with Dr Bary Castilla and he advised that she may leave the sleeve off if it is causing her pain. She needs to keep the arm elevated. It will take a couple of days for the antibiotics to start working to relieve her symptoms. I advised the patient of this and she expressed understanding. She is aware to call back if her symptoms worsen.

## 2014-08-06 NOTE — Telephone Encounter (Signed)
Notified CBC OK. To continue antibiotics. F/U as scheduled.

## 2014-08-09 ENCOUNTER — Ambulatory Visit (INDEPENDENT_AMBULATORY_CARE_PROVIDER_SITE_OTHER): Payer: Medicare Other | Admitting: Podiatry

## 2014-08-09 DIAGNOSIS — M79609 Pain in unspecified limb: Secondary | ICD-10-CM

## 2014-08-09 DIAGNOSIS — M79676 Pain in unspecified toe(s): Secondary | ICD-10-CM

## 2014-08-09 DIAGNOSIS — B351 Tinea unguium: Secondary | ICD-10-CM

## 2014-08-09 NOTE — Progress Notes (Signed)
She presents today chief complaint of painful elongated toenails.  Objective: Pulses are palpable bilateral. Nails are thick yellow dystrophic with mycotic and painful palpation.  Assessment: Pain in limb secondary to onychomycosis 1 through 5 bilateral.  Plan: Treatment of nails 1-5 bilateral.

## 2014-08-12 ENCOUNTER — Telehealth: Payer: Self-pay | Admitting: *Deleted

## 2014-08-12 ENCOUNTER — Telehealth: Payer: Self-pay | Admitting: General Surgery

## 2014-08-12 NOTE — Telephone Encounter (Signed)
appt 1130 with Dr Bary Castilla on 08-13-14, pt agrees.

## 2014-08-12 NOTE — Telephone Encounter (Signed)
Patient called & wanted to leave a message for Rosann Auerbach since she was @ lunch. Her arm is swollen more than it was earlier & now at elbow has a knot on lt side. Please call pt @ [phone]. Thank you/mth

## 2014-08-12 NOTE — Telephone Encounter (Signed)
Patient called & wanted to leave a message for Hailey Ellison since she was @ lunch. Her arm is swollen more than it was earlier & now at elbow has a knot on lt side. Please call pt @ [phone]. Thank you/mth

## 2014-08-12 NOTE — Telephone Encounter (Signed)
Pt wants you to call her when you get a chance, its regarding her arm issues.

## 2014-08-13 ENCOUNTER — Ambulatory Visit (INDEPENDENT_AMBULATORY_CARE_PROVIDER_SITE_OTHER): Payer: Medicare Other | Admitting: General Surgery

## 2014-08-13 ENCOUNTER — Other Ambulatory Visit: Payer: Medicare Other

## 2014-08-13 ENCOUNTER — Encounter: Payer: Self-pay | Admitting: General Surgery

## 2014-08-13 VITALS — BP 118/68 | HR 84 | Resp 16 | Ht 65.0 in | Wt 183.0 lb

## 2014-08-13 DIAGNOSIS — I890 Lymphedema, not elsewhere classified: Secondary | ICD-10-CM

## 2014-08-13 DIAGNOSIS — Z853 Personal history of malignant neoplasm of breast: Secondary | ICD-10-CM

## 2014-08-13 NOTE — Progress Notes (Signed)
Patient ID: Hailey Ellison, female   DOB: 05-27-1935, 79 y.o.   MRN: [id number]  Chief Complaint  Patient presents with  . Follow-up    left arm edema    HPI Hailey Ellison is a 79 y.o. female who presents for an evaluation of left arm edema. The patient states she noticed her arm swelling more on Wednesday night of this week. She denies any fever or chills. She does have some tingling pain in her arm that is tolerable but feels this is not normal.  The patient did complete a trial of Duricef as requested without improvement. The arm swelling has precluded the use of her previously fitted compression sleeve.  HPI  Past Medical History  Diagnosis Date  . Hypertension   . Murmur   . Radiation 2010    with chemo  . Arthritis   . Lymphedema   . Chronic kidney disease 2014  . Cancer 2010    T1c,N2a, M0; ER 90%, PR 60%,  HER-2/neu 2+ IHC, equivocal on fish.    Past Surgical History  Procedure Laterality Date  . Partial hysterectomy  1980's  . Carpal tunnel release Bilateral   . Port-a-cath removal    . Cholecystectomy    . Joint replacement Left 2008  . Breast biopsy Left 2010  . Breast cyst excision    . Breast biopsy Right   . Breast lumpectomy Left 2010    Family History  Problem Relation Age of Onset  . Cancer Sister     breast  . Cancer Sister     kidney    Social History History  Substance Use Topics  . Smoking status: Former Research scientist (life sciences)  . Smokeless tobacco: Not on file  . Alcohol Use: No    Allergies  Allergen Reactions  . Benadryl [Diphenhydramine Hcl] Hives  . Tape   . Tegaderm Ag Mesh [Silver]   . Neosporin [Neomycin-Bacitracin Zn-Polymyx] Rash    Current Outpatient Prescriptions  Medication Sig Dispense Refill  . anastrozole (ARIMIDEX) 1 MG tablet Take 1 mg by mouth daily.      . calcium citrate-vitamin D (CITRACAL+D) 315-200 MG-UNIT per tablet Take 1 tablet by mouth 2 (two) times daily.      . cefadroxil (DURICEF) 500 MG capsule Take 1 capsule (500  mg total) by mouth 2 (two) times daily.  30 capsule  0  . dipyridamole-aspirin (AGGRENOX) 200-25 MG per 12 hr capsule Take 1 capsule by mouth 2 (two) times daily.      . fluticasone (FLONASE) 50 MCG/ACT nasal spray       . glucosamine-chondroitin 500-400 MG tablet Take 2 tablets by mouth daily.      . hydrochlorothiazide (HYDRODIURIL) 25 MG tablet Take 25 mg by mouth daily.      Marland Kitchen HYDROcodone-homatropine (HYCODAN) 5-1.5 MG/5ML syrup       . lansoprazole (PREVACID SOLUTAB) 30 MG disintegrating tablet Take 30 mg by mouth daily.      . Multiple Vitamin (MULTIVITAMIN WITH MINERALS) TABS Take 1 tablet by mouth daily.      . predniSONE (STERAPRED UNI-PAK) 10 MG tablet Take by mouth daily.      . valsartan (DIOVAN) 320 MG tablet Take 320 mg by mouth daily.      . verapamil (CALAN) 120 MG tablet Take 60 mg by mouth daily.       No current facility-administered medications for this visit.    Review of Systems Review of Systems  Constitutional: Negative.   Respiratory: Negative.  Cardiovascular: Negative.     Blood pressure 118/68, pulse 84, resp. rate 16, height _0  (1.651 m), weight 183 lb (83.008 kg).  Physical Exam Physical Exam  Cardiovascular:  No lower extremity edema noted.  No evidence of SVC syndrome noted.  Pulmonary/Chest: Right breast exhibits tenderness (located underneath the right breast).    Musculoskeletal:       Arms: Arm circumference measured at a location 15 cm superior as well as 10 and 20 cm inferior to the olecranon process.  Right: 29, 25.5, 19.5. (Previously 29.5, 26 and 19 respectively on August 04, 2014).  Left: 30, 30 and 24. (Previously 29, 28.5 and 21.5 respectively on August 04, 2014.)    Data Reviewed Ultrasound examination of the venous tree of the left upper extremity was completed. Scanning was completed from the proximal portion of the axillary vein to the elbow. Normal venous compressibility and respiratory variation was noted throughout. No  visible thrombus. Normal exam.  Assessment    Progressive lymphedema.     Plan    The reason for the acute decompensation is unclear. No evidence of venous obstruction. No evidence of infection. No evidence of axillary mass or clearcut chest wall recurrence.  The case was reviewed by phone with Leia Alf, M.D. The patient's medical oncologist.  Arrangements are in place for a noncontrast chest CT (because of the patient's poor renal function) to assess for secondary sources of edema as well as obtaining a CA 27-29 to assess for recurrent disease.     PCP: [name] 08/13/2014, 9:30 PM

## 2014-08-16 ENCOUNTER — Ambulatory Visit: Payer: Self-pay | Admitting: General Surgery

## 2014-08-17 ENCOUNTER — Telehealth: Payer: Self-pay | Admitting: *Deleted

## 2014-08-17 ENCOUNTER — Encounter: Payer: Self-pay | Admitting: General Surgery

## 2014-08-17 LAB — CANCER ANTIGEN 27.29: CA 27.29: 27.7 U/mL (ref 0.0–38.6)

## 2014-08-17 NOTE — Telephone Encounter (Signed)
Patient called checking on status of PT referral. She states she has called [facility] and they have not received the referral yet. She provided fax # for [facility] which is [phone] Attn: Gwenette Greet. Please contact patient with update on referral.

## 2014-08-18 ENCOUNTER — Encounter: Payer: Self-pay | Admitting: General Surgery

## 2014-08-18 ENCOUNTER — Ambulatory Visit: Payer: 59 | Admitting: General Surgery

## 2014-08-19 ENCOUNTER — Telehealth: Payer: Self-pay | Admitting: *Deleted

## 2014-08-19 NOTE — Telephone Encounter (Signed)
Message copied by Carson Myrtle on Thu Aug 19, 2014  8:33 AM ------      Message from: [place]argito, Carson W      Created: Wed Aug 18, 2014  9:58 PM       Please notify the patient is both a CT of the blood work were normal. Arrange for early assessment by Dr. Ma Hillock. I have spoken with him and he is amenable to reassessing the patient. See if she has been contacted by OT regarding lymphedema treatment. Thank you      ----- Message -----         From: Darrin Nipper, CMA         Sent: 08/17/2014   9:32 AM           To: Robert Bellow, MD                   ------

## 2014-08-19 NOTE — Telephone Encounter (Signed)
I talked with the patient, notified of results. Appt with Dr Ma Hillock for Wednesday 08-25-14 at 10:45, pt agrees. Notes faxed. Appt with Maureen O.T. Is Monday 08-23-14.

## 2014-08-23 ENCOUNTER — Encounter: Payer: Self-pay | Admitting: General Surgery

## 2014-08-25 ENCOUNTER — Ambulatory Visit: Payer: Self-pay | Admitting: Internal Medicine

## 2014-08-25 LAB — CBC CANCER CENTER
Basophil #: 0 x10 3/mm (ref 0.0–0.1)
Basophil %: 0.6 %
EOS ABS: 0.1 x10 3/mm (ref 0.0–0.7)
Eosinophil %: 2.5 %
HCT: 36.5 % (ref 35.0–47.0)
HGB: 12.4 g/dL (ref 12.0–16.0)
Lymphocyte #: 1.3 x10 3/mm (ref 1.0–3.6)
Lymphocyte %: 32.3 %
MCH: 33.7 pg (ref 26.0–34.0)
MCHC: 34 g/dL (ref 32.0–36.0)
MCV: 99 fL (ref 80–100)
Monocyte #: 0.2 x10 3/mm (ref 0.2–0.9)
Monocyte %: 5.5 %
NEUTROS ABS: 2.5 x10 3/mm (ref 1.4–6.5)
NEUTROS PCT: 59.1 %
PLATELETS: 142 x10 3/mm — AB (ref 150–440)
RBC: 3.69 10*6/uL — ABNORMAL LOW (ref 3.80–5.20)
RDW: 12.3 % (ref 11.5–14.5)
WBC: 4.2 x10 3/mm (ref 3.6–11.0)

## 2014-08-25 LAB — HEPATIC FUNCTION PANEL A (ARMC)
ALBUMIN: 3.7 g/dL (ref 3.4–5.0)
Alkaline Phosphatase: 68 U/L
BILIRUBIN DIRECT: 0.1 mg/dL (ref 0.00–0.20)
Bilirubin,Total: 0.6 mg/dL (ref 0.2–1.0)
SGOT(AST): 24 U/L (ref 15–37)
SGPT (ALT): 22 U/L
Total Protein: 6.3 g/dL — ABNORMAL LOW (ref 6.4–8.2)

## 2014-08-25 LAB — BASIC METABOLIC PANEL
Anion Gap: 9 (ref 7–16)
BUN: 30 mg/dL — AB (ref 7–18)
CALCIUM: 8.8 mg/dL (ref 8.5–10.1)
CHLORIDE: 107 mmol/L (ref 98–107)
CO2: 27 mmol/L (ref 21–32)
Creatinine: 1.78 mg/dL — ABNORMAL HIGH (ref 0.60–1.30)
EGFR (African American): 32 — ABNORMAL LOW
EGFR (Non-African Amer.): 27 — ABNORMAL LOW
Glucose: 116 mg/dL — ABNORMAL HIGH (ref 65–99)
Osmolality: 292 (ref 275–301)
Potassium: 4.3 mmol/L (ref 3.5–5.1)
Sodium: 143 mmol/L (ref 136–145)

## 2014-08-26 LAB — CANCER ANTIGEN 27.29: CA 27.29: 19.3 U/mL (ref 0.0–38.6)

## 2014-09-01 ENCOUNTER — Encounter: Payer: Self-pay | Admitting: General Surgery

## 2014-09-23 ENCOUNTER — Ambulatory Visit: Payer: Self-pay | Admitting: Internal Medicine

## 2014-09-23 ENCOUNTER — Encounter: Payer: Self-pay | Admitting: General Surgery

## 2014-10-24 ENCOUNTER — Encounter: Payer: Self-pay | Admitting: General Surgery

## 2014-10-25 ENCOUNTER — Encounter: Payer: Self-pay | Admitting: General Surgery

## 2014-11-08 ENCOUNTER — Ambulatory Visit: Payer: Medicare Other | Admitting: Podiatry

## 2014-11-23 ENCOUNTER — Encounter: Payer: Self-pay | Admitting: General Surgery

## 2014-12-06 ENCOUNTER — Ambulatory Visit: Payer: Medicare Other | Admitting: Podiatry

## 2014-12-06 ENCOUNTER — Ambulatory Visit (INDEPENDENT_AMBULATORY_CARE_PROVIDER_SITE_OTHER): Payer: Medicare Other | Admitting: Podiatry

## 2014-12-06 DIAGNOSIS — B351 Tinea unguium: Secondary | ICD-10-CM

## 2014-12-06 DIAGNOSIS — M79676 Pain in unspecified toe(s): Secondary | ICD-10-CM

## 2014-12-06 NOTE — Progress Notes (Signed)
She presents today chief complaint of painful elongated toenails.  Objective: Pulses are palpable bilateral. Nails are thick yellow dystrophic with mycotic and painful palpation.  Assessment: Pain in limb secondary to onychomycosis 1 through 5 bilateral.  Plan: Treatment of nails 1-5 bilateral.   . R lateral hallux border curetted slightly.

## 2015-03-08 ENCOUNTER — Ambulatory Visit (INDEPENDENT_AMBULATORY_CARE_PROVIDER_SITE_OTHER): Payer: Medicare Other | Admitting: Podiatry

## 2015-03-08 VITALS — BP 117/60 | HR 87 | Resp 16

## 2015-03-08 DIAGNOSIS — B351 Tinea unguium: Secondary | ICD-10-CM | POA: Diagnosis not present

## 2015-03-08 DIAGNOSIS — M7661 Achilles tendinitis, right leg: Secondary | ICD-10-CM

## 2015-03-08 DIAGNOSIS — M79676 Pain in unspecified toe(s): Secondary | ICD-10-CM

## 2015-03-09 ENCOUNTER — Ambulatory Visit: Payer: Medicare Other | Admitting: Podiatry

## 2015-03-09 NOTE — Patient Instructions (Signed)
Achilles Tendinitis   with Rehab  Achilles tendinitis is a disorder of the Achilles tendon. The Achilles tendon connects the large calf muscles (Gastrocnemius and Soleus) to the heel bone (calcaneus). This tendon is sometimes called the heel cord. It is important for pushing-off and standing on your toes and is important for walking, running, or jumping. Tendinitis is often caused by overuse and repetitive microtrauma.  SYMPTOMS  · Pain, tenderness, swelling, warmth, and redness may occur over the Achilles tendon even at rest.  · Pain with pushing off, or flexing or extending the ankle.  · Pain that is worsened after or during activity.  CAUSES   · Overuse sometimes seen with rapid increase in exercise programs or in sports requiring running and jumping.  · Poor physical conditioning (strength and flexibility or endurance).  · Running sports, especially training running down hills.  · Inadequate warm-up before practice or play or failure to stretch before participation.  · Injury to the tendon.  PREVENTION   · Warm up and stretch before practice or competition.  · Allow time for adequate rest and recovery between practices and competition.  · Keep up conditioning.  ¨ Keep up ankle and leg flexibility.  ¨ Improve or keep muscle strength and endurance.  ¨ Improve cardiovascular fitness.  · Use proper technique.  · Use proper equipment (shoes, skates).  · To help prevent recurrence, taping, protective strapping, or an adhesive bandage may be recommended for several weeks after healing is complete.  PROGNOSIS   · Recovery may take weeks to several months to heal.  · Longer recovery is expected if symptoms have been prolonged.  · Recovery is usually quicker if the inflammation is due to a direct blow as compared with overuse or sudden strain.  RELATED COMPLICATIONS   · Healing time will be prolonged if the condition is not correctly treated. The injury must be given plenty of time to heal.  · Symptoms can reoccur if  activity is resumed too soon.  · Untreated, tendinitis may increase the risk of tendon rupture requiring additional time for recovery and possibly surgery.  TREATMENT   · The first treatment consists of rest anti-inflammatory medication, and ice to relieve the pain.  · Stretching and strengthening exercises after resolution of pain will likely help reduce the risk of recurrence. Referral to a physical therapist or athletic trainer for further evaluation and treatment may be helpful.  · A walking boot or cast may be recommended to rest the Achilles tendon. This can help break the cycle of inflammation and microtrauma.  · Arch supports (orthotics) may be prescribed or recommended by your caregiver as an adjunct to therapy and rest.  · Surgery to remove the inflamed tendon lining or degenerated tendon tissue is rarely necessary and has shown less than predictable results.  MEDICATION   · Nonsteroidal anti-inflammatory medications, such as aspirin and ibuprofen, may be used for pain and inflammation relief. Do not take within 7 days before surgery. Take these as directed by your caregiver. Contact your caregiver immediately if any bleeding, stomach upset, or signs of allergic reaction occur. Other minor pain relievers, such as acetaminophen, may also be used.  · Pain relievers may be prescribed as necessary by your caregiver. Do not take prescription pain medication for longer than 4 to 7 days. Use only as directed and only as much as you need.  · Cortisone injections are rarely indicated. Cortisone injections may weaken tendons and predispose to rupture. It is better   to give the condition more time to heal than to use them.  HEAT AND COLD  · Cold is used to relieve pain and reduce inflammation for acute and chronic Achilles tendinitis. Cold should be applied for 10 to 15 minutes every 2 to 3 hours for inflammation and pain and immediately after any activity that aggravates your symptoms. Use ice packs or an ice  massage.  · Heat may be used before performing stretching and strengthening activities prescribed by your caregiver. Use a heat pack or a warm soak.  SEEK MEDICAL CARE IF:  · Symptoms get worse or do not improve in 2 weeks despite treatment.  · New, unexplained symptoms develop. Drugs used in treatment may produce side effects.  EXERCISES  RANGE OF MOTION (ROM) AND STRETCHING EXERCISES - Achilles Tendinitis   These exercises may help you when beginning to rehabilitate your injury. Your symptoms may resolve with or without further involvement from your physician, physical therapist or athletic trainer. While completing these exercises, remember:   · Restoring tissue flexibility helps normal motion to return to the joints. This allows healthier, less painful movement and activity.  · An effective stretch should be held for at least 30 seconds.  · A stretch should never be painful. You should only feel a gentle lengthening or release in the stretched tissue.  STRETCH - Gastroc, Standing   · Place hands on wall.  · Extend right / left leg, keeping the front knee somewhat bent.  · Slightly point your toes inward on your back foot.  · Keeping your right / left heel on the floor and your knee straight, shift your weight toward the wall, not allowing your back to arch.  · You should feel a gentle stretch in the right / left calf. Hold this position for __________ seconds.  Repeat __________ times. Complete this stretch __________ times per day.  STRETCH - Soleus, Standing   · Place hands on wall.  · Extend right / left leg, keeping the other knee somewhat bent.  · Slightly point your toes inward on your back foot.  · Keep your right / left heel on the floor, bend your back knee, and slightly shift your weight over the back leg so that you feel a gentle stretch deep in your back calf.  · Hold this position for __________ seconds.  Repeat __________ times. Complete this stretch __________ times per day.  STRETCH -  Gastrocsoleus, Standing   Note: This exercise can place a lot of stress on your foot and ankle. Please complete this exercise only if specifically instructed by your caregiver.   · Place the ball of your right / left foot on a step, keeping your other foot firmly on the same step.  · Hold on to the wall or a rail for balance.  · Slowly lift your other foot, allowing your body weight to press your heel down over the edge of the step.  · You should feel a stretch in your right / left calf.  · Hold this position for __________ seconds.  · Repeat this exercise with a slight bend in your knee.  Repeat __________ times. Complete this stretch __________ times per day.   STRENGTHENING EXERCISES - Achilles Tendinitis  These exercises may help you when beginning to rehabilitate your injury. They may resolve your symptoms with or without further involvement from your physician, physical therapist or athletic trainer. While completing these exercises, remember:   · Muscles can gain both the endurance   and the strength needed for everyday activities through controlled exercises.  · Complete these exercises as instructed by your physician, physical therapist or athletic trainer. Progress the resistance and repetitions only as guided.  · You may experience muscle soreness or fatigue, but the pain or discomfort you are trying to eliminate should never worsen during these exercises. If this pain does worsen, stop and make certain you are following the directions exactly. If the pain is still present after adjustments, discontinue the exercise until you can discuss the trouble with your clinician.  STRENGTH - Plantar-flexors   · Sit with your right / left leg extended. Holding onto both ends of a rubber exercise band/tubing, loop it around the ball of your foot. Keep a slight tension in the band.  · Slowly push your toes away from you, pointing them downward.  · Hold this position for __________ seconds. Return slowly, controlling the  tension in the band/tubing.  Repeat __________ times. Complete this exercise __________ times per day.   STRENGTH - Plantar-flexors   · Stand with your feet shoulder width apart. Steady yourself with a wall or table using as little support as needed.  · Keeping your weight evenly spread over the width of your feet, rise up on your toes.*  · Hold this position for __________ seconds.  Repeat __________ times. Complete this exercise __________ times per day.   *If this is too easy, shift your weight toward your right / left leg until you feel challenged. Ultimately, you may be asked to do this exercise with your right / left foot only.  STRENGTH - Plantar-flexors, Eccentric   Note: This exercise can place a lot of stress on your foot and ankle. Please complete this exercise only if specifically instructed by your caregiver.   · Place the balls of your feet on a step. With your hands, use only enough support from a wall or rail to keep your balance.  · Keep your knees straight and rise up on your toes.  · Slowly shift your weight entirely to your right / left toes and pick up your opposite foot. Gently and with controlled movement, lower your weight through your right / left foot so that your heel drops below the level of the step. You will feel a slight stretch in the back of your calf at the end position.  · Use the healthy leg to help rise up onto the balls of both feet, then lower weight only on the right / left leg again. Build up to 15 repetitions. Then progress to 3 consecutive sets of 15 repetitions.*  · After completing the above exercise, complete the same exercise with a slight knee bend (about 30 degrees). Again, build up to 15 repetitions. Then progress to 3 consecutive sets of 15 repetitions.*  Perform this exercise __________ times per day.   *When you easily complete 3 sets of 15, your physician, physical therapist or athletic trainer may advise you to add resistance by wearing a backpack filled with  additional weight.  STRENGTH - Plantar Flexors, Seated   · Sit on a chair that allows your feet to rest flat on the ground. If necessary, sit at the edge of the chair.  · Keeping your toes firmly on the ground, lift your right / left heel as far as you can without increasing any discomfort in your ankle.  Repeat __________ times. Complete this exercise __________ times a day.  *If instructed by your physician, physical therapist or athletic   trainer, you may add ____________________ of resistance by placing a weighted object on your right / left knee.  Document Released: 07/11/2005 Document Revised: 03/03/2012 Document Reviewed: 03/24/2009  [employer]® Patient Information ©2015 [employer]. This information is not intended to replace advice given to you by your health care provider. Make sure you discuss any questions you have with your health care provider.

## 2015-03-09 NOTE — Progress Notes (Signed)
Patient ID: Hailey Ellison, female   DOB: Jun 08, 1935, 80 y.o.   MRN: [id number]  Subjective: 80 y.o.-year-old female returns the office today for painful, elongated, thickened toenails which she is unable to trim herself. Denies any redness or drainage around the nails. She also states that she has been having pain in the back of her right heel for several months. She brought and copies of an x-ray that she had performed  On 11/2014 of her right foot. She states that she gets pain to the back of her heel with certain shoes and prolonged ambulation. She denies any swelling or redness overlying the area. She denies any history of injury or trauma. Denies any numbness or tingling to the area. Denies any acute changes since last appointment and no new complaints today.   Objective: AAO 3, NAD DP/PT pulses palpable, CRT less than 3 seconds Protective sensation intact with Simms Weinstein monofilament, Achilles tendon reflex intact.  Nails hypertrophic, dystrophic, elongated, brittle, discolored 10. There is tenderness overlying the nails 1-5 bilaterally. There is no surrounding erythema or drainage along the nail sites. No open lesions or pre-ulcerative lesions are identified. There is mild tenderness to palpation along the posterior aspect of the calcaneus on the insertion of the Achilles tendon. There is no pain on the course or mid substance of the Achilles tendon there is no defect noted. Thompson test was performed and was negative. There is no pain with lateral compression of the calcaneus or pain with vibratory sensation. No pain on the plantar aspect of the calcaneus. No other areas of tenderness to bilateral lower extremities. No overlying edema, erythema, increase in warmth. MMT 5/5, ROM WNL No pain with calf compression, swelling, warmth, erythema.  Assessment: Patient presents with symptomatic onychomycosis; right insertional Achilles tendinitis  Plan: -Treatment options including  alternatives, risks, complications were discussed -Nails sharply debrided 10 without complication/bleeding. -Discussed daily foot inspection. If there are any changes, to call the office immediately.  -X-ray results from December 2015 was reviewed which revealed large plantar calcaneal spur, enthesopathic changes at the insertion of the achilles tendon, degenerative changes of the ankle joint, no evidence of calcaneal fracture, no gross abnormalities to the subtalar joint. I discussed likely etiology of her symptoms. I also discussed various stretching exercises for the Achilles tendon as well as ice to the area. Dispensed heel lifts to wear needed. She has a night splint at home which I recommended her to continue. -Follow-up in 4 weeks to recheck right heel or sooner if any problems are to arise. In the meantime, encouraged to call the office with any questions, concerns, changes symptoms.

## 2015-04-05 ENCOUNTER — Ambulatory Visit: Payer: Medicare Other | Admitting: Podiatry

## 2015-04-07 ENCOUNTER — Other Ambulatory Visit: Payer: Self-pay

## 2015-04-07 DIAGNOSIS — Z853 Personal history of malignant neoplasm of breast: Secondary | ICD-10-CM

## 2015-04-19 ENCOUNTER — Ambulatory Visit (INDEPENDENT_AMBULATORY_CARE_PROVIDER_SITE_OTHER): Payer: Medicare Other | Admitting: Podiatry

## 2015-04-19 ENCOUNTER — Encounter: Payer: Self-pay | Admitting: Podiatry

## 2015-04-19 VITALS — Ht 65.0 in | Wt 179.0 lb

## 2015-04-19 DIAGNOSIS — M7661 Achilles tendinitis, right leg: Secondary | ICD-10-CM | POA: Diagnosis not present

## 2015-04-21 NOTE — Progress Notes (Signed)
Patient ID: Hailey Ellison, female   DOB: [birth year], 80 y.o.   MRN: [id number]  Subjective: 80 year old female presents the office for follow-up evaluation of right Achilles tendinitis. She states that since last appointment she is continuing stretching activities as well as icing which helps alleviate her symptoms. She states that she intermittently has some discomfort however overall she is much improved. She is able to perform daily activities without any pain. Denies any systemic complaints such as fevers, chills, nausea, vomiting. No acute changes since last appointment, and no other complaints at this time.   Objective: AAO x3, NAD DP/PT pulses palpable bilaterally, CRT less than 3 seconds Protective sensation intact with Simms Weinstein monofilament, vibratory sensation intact, Achilles tendon reflex intact There is no tenderness to palpation along the course with insertion of the Achilles tendon. There is no defect noted Thompson test was performed and is negative. There is mild equinus and the left more than the right. There is no edema, erythema, increase in warmth. No areas of pinpoint bony tenderness or pain with vibratory sensation. MMT 5/5, ROM WNL. No edema, erythema, increase in warmth to bilateral lower extremities.  No open lesions or pre-ulcerative lesions.  No pain with calf compression, swelling, warmth, erythema  Assessment: 80 year old female with resolving right insertional Achilles tendinitis  Plan: -All treatment options discussed with the patient including all alternatives, risks, complications.  -Continue with icing, stretching activities. Discussed shoe gear modifications and possible heel lift if needed however at this time since her pain is resolving we'll hold off. -Follow-up in 2 months for routine care or sooner if any problems are to arise. -Patient encouraged to call the office with any questions, concerns, change in symptoms.

## 2015-05-12 ENCOUNTER — Ambulatory Visit (INDEPENDENT_AMBULATORY_CARE_PROVIDER_SITE_OTHER): Payer: Medicare Other | Admitting: General Surgery

## 2015-05-12 ENCOUNTER — Encounter: Payer: Self-pay | Admitting: General Surgery

## 2015-05-12 VITALS — BP 128/70 | HR 82 | Resp 14 | Ht 65.0 in | Wt 184.0 lb

## 2015-05-12 DIAGNOSIS — Z853 Personal history of malignant neoplasm of breast: Secondary | ICD-10-CM | POA: Diagnosis not present

## 2015-05-12 DIAGNOSIS — R229 Localized swelling, mass and lump, unspecified: Secondary | ICD-10-CM | POA: Diagnosis not present

## 2015-05-12 NOTE — Progress Notes (Signed)
Patient ID: Hailey Ellison, female   DOB: 04/14/1935, 80 y.o.   MRN: [id number]  No chief complaint on file.   HPI Hailey Ellison is a 80 y.o. female with a history of left breast cancer who presents for a breast evaluation. The most recent mammogram was done on 05/03/15. Patient does perform regular self breast checks and gets regular mammograms done. She denies any new breast problems.    HPI  Past Medical History  Diagnosis Date  . Hypertension   . Murmur   . Radiation 2010    with chemo  . Arthritis   . Lymphedema   . Chronic kidney disease 2014  . Cancer 2010    T1c,N2a, M0; ER 90%, PR 60%,  HER-2/neu 2+ IHC, equivocal on fish. 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  Margins clear.  . Breast cancer     Adjuvant chemotherapy with Taxotere/Carboplatin/Herceptin. Arimidex initiated November 2011    Past Surgical History  Procedure Laterality Date  . Partial hysterectomy  1980's  . Carpal tunnel release Bilateral   . Port-a-cath removal    . Cholecystectomy    . Joint replacement Left 2008  . Breast biopsy Left 2010  . Breast cyst excision    . Breast biopsy Right   . Breast lumpectomy Left 2010    Family History  Problem Relation Age of Onset  . Cancer Sister     breast  . Cancer Sister     kidney    Social History History  Substance Use Topics  . Smoking status: Former Research scientist (life sciences)  . Smokeless tobacco: Never Used  . Alcohol Use: No    Allergies  Allergen Reactions  . Benadryl [Diphenhydramine Hcl] Hives  . Tape   . Tegaderm Ag Mesh [Silver]   . Neosporin [Neomycin-Bacitracin Zn-Polymyx] Rash    Current Outpatient Prescriptions  Medication Sig Dispense Refill  . anastrozole (ARIMIDEX) 1 MG tablet Take 1 mg by mouth daily.    . calcium citrate-vitamin D (CITRACAL+D) 315-200 MG-UNIT per tablet Take 1 tablet by mouth 2 (two) times daily.     Marland Kitchen dipyridamole-aspirin (AGGRENOX) 200-25 MG per 12 hr capsule Take 1 capsule by mouth 2 (two) times daily.    Marland Kitchen glucosamine-chondroitin 500-400 MG tablet Take 2 tablets by mouth daily.    . hydrochlorothiazide (HYDRODIURIL) 25 MG tablet Take 25 mg by mouth daily.    Marland Kitchen HYDROcodone-homatropine (HYCODAN) 5-1.5 MG/5ML syrup     . lansoprazole (PREVACID SOLUTAB) 30 MG disintegrating tablet Take 30 mg by mouth daily.    . Multiple Vitamin (MULTIVITAMIN WITH MINERALS) TABS Take 1 tablet by mouth daily.    . valsartan (DIOVAN) 320 MG tablet Take 320 mg by mouth daily.    . verapamil (CALAN) 120 MG tablet Take 60 mg by mouth daily.     No current facility-administered medications for this visit.    Review of Systems Review of Systems  Constitutional: Negative.   Respiratory: Negative.   Cardiovascular: Negative.     Blood pressure 128/70, pulse 82, resp. rate 14, height _0  (1.651 m), weight 184 lb (83.462 kg).  Physical Exam Physical Exam  Constitutional: She is oriented to person, place, and time. She appears well-developed and well-nourished.  Neck: Neck supple.  Tenderness in the posterior neck. Tenderness in the supraspinatus.   Cardiovascular: Normal rate and regular rhythm.   Murmur heard.  Systolic murmur  is present with a grade of 3/6  Pulmonary/Chest: Effort normal and breath sounds normal. Right breast exhibits no inverted nipple, no mass, no nipple discharge, no skin change and no tenderness. Left breast exhibits no inverted nipple, no mass, no nipple discharge and no tenderness.    Left breast thickening along surgical scar-stable. Their is a 6x9 mm raised area below the scar.    Lymphedema sleeve in place. Marked improvement since reinstitution of compressive garment.   Lymphadenopathy:    She has no cervical adenopathy.  Neurological: She is alert and oriented to person, place, and time.  Skin: Skin is warm and dry.    Data Reviewed Bilateral mammograms dated  05/03/2015 completed at [facility] were reviewed. Postsurgical changes noted on the left. Skin thickening consistent with previous radiation is again noted. By red-2.  Assessment    New skin lesion left chest, likely secondary to underlying benign breast process.      Plan    The patient was amenable to biopsy of the area. 3 mL of 0.5% Xylocaine with 0.25% Marcaine with 1-200,000 units of epinephrine was utilized well tolerated. A 2.5 mm punch biopsy was obtained. The skin defect was closed with a single 4-0 nylon suture. Telfa and Tegaderm dressing applied.  The patient will be contacted when biopsy results are available.  The patient will be completing 5 years of anastrozole therapy in November of this year.  She'll return in one week for suture removal. Physician exam with bilateral mammograms in one year.    PCP: [name] 05/14/2015, 7:33 AM

## 2015-05-14 ENCOUNTER — Encounter: Payer: Self-pay | Admitting: General Surgery

## 2015-05-14 DIAGNOSIS — R229 Localized swelling, mass and lump, unspecified: Secondary | ICD-10-CM | POA: Insufficient documentation

## 2015-05-17 ENCOUNTER — Telehealth: Payer: Self-pay | Admitting: *Deleted

## 2015-05-17 NOTE — Telephone Encounter (Signed)
-----   Message from Robert Bellow, MD sent at 05/16/2015  9:28 PM EDT ----- Please notify the patient that the area biopsied was an early skin cancer.  We need to take the rest of the lesion off.  (Office- 15 minutes).  Not related to her past breast cancer. Thanks. ----- Message -----    From: [facility]    Sent: 05/16/2015  11:15 AM      To: Robert Bellow, MD

## 2015-05-17 NOTE — Telephone Encounter (Signed)
Notified patient as instructed, patient pleased. Discussed follow-up appointments, patient agrees  

## 2015-05-19 ENCOUNTER — Ambulatory Visit: Payer: Medicare Other

## 2015-05-20 ENCOUNTER — Ambulatory Visit (INDEPENDENT_AMBULATORY_CARE_PROVIDER_SITE_OTHER): Payer: Medicare Other | Admitting: *Deleted

## 2015-05-20 DIAGNOSIS — R229 Localized swelling, mass and lump, unspecified: Secondary | ICD-10-CM

## 2015-05-20 NOTE — Progress Notes (Signed)
The sutures were removed and steri strips applied. 

## 2015-05-24 ENCOUNTER — Encounter: Payer: Self-pay | Admitting: General Surgery

## 2015-05-24 ENCOUNTER — Ambulatory Visit (INDEPENDENT_AMBULATORY_CARE_PROVIDER_SITE_OTHER): Payer: Medicare Other | Admitting: General Surgery

## 2015-05-24 VITALS — BP 128/60 | HR 80 | Resp 12 | Ht 65.0 in | Wt 184.0 lb

## 2015-05-24 DIAGNOSIS — D045 Carcinoma in situ of skin of trunk: Secondary | ICD-10-CM

## 2015-05-24 DIAGNOSIS — D099 Carcinoma in situ, unspecified: Secondary | ICD-10-CM

## 2015-05-24 NOTE — Patient Instructions (Addendum)
Keep area clean Suture removal in 9 days

## 2015-05-24 NOTE — Progress Notes (Signed)
Patient ID: Hailey Ellison, female   DOB: 04-08-1935, 80 y.o.   MRN: [id number]  Chief Complaint  Patient presents with  . Procedure    excision left breast lesion    HPI Hailey Ellison is a 80 y.o. female.  Here today for excision left breast lesion, recent biopsy showed squamous cell carcinoma in situ.   HPI  Past Medical History  Diagnosis Date  . Hypertension   . Murmur   . Radiation 2010    with chemo  . Arthritis   . Lymphedema   . Chronic kidney disease 2014  . Cancer 2010    T1c,N2a, M0; ER 90%, PR 60%,  HER-2/neu 2+ IHC, equivocal on fish. 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  Margins clear.  . Breast cancer     Adjuvant chemotherapy with Taxotere/Carboplatin/Herceptin. Arimidex initiated November 2011    Past Surgical History  Procedure Laterality Date  . Partial hysterectomy  1980's  . Carpal tunnel release Bilateral   . Port-a-cath removal    . Cholecystectomy    . Joint replacement Left 2008  . Breast biopsy Left 2010  . Breast cyst excision    . Breast biopsy Right   . Breast lumpectomy Left 2010  . Breast biopsy Left 05-12-15    SQUAMOUS CELL CARCINOMA IN SITU.    Family History  Problem Relation Age of Onset  . Cancer Sister     breast  . Cancer Sister     kidney    Social History History  Substance Use Topics  . Smoking status: Former Research scientist (life sciences)  . Smokeless tobacco: Never Used  . Alcohol Use: No    Allergies  Allergen Reactions  . Benadryl [Diphenhydramine Hcl] Hives  . Tape   . Tegaderm Ag Mesh [Silver]   . Neosporin [Neomycin-Bacitracin Zn-Polymyx] Rash    Current Outpatient Prescriptions  Medication Sig Dispense Refill  . anastrozole (ARIMIDEX) 1 MG tablet Take 1 mg by mouth daily.    . calcium citrate-vitamin D (CITRACAL+D) 315-200 MG-UNIT per tablet Take 1 tablet by mouth 2 (two) times daily.    Marland Kitchen  dipyridamole-aspirin (AGGRENOX) 200-25 MG per 12 hr capsule Take 1 capsule by mouth 2 (two) times daily.    Marland Kitchen glucosamine-chondroitin 500-400 MG tablet Take 2 tablets by mouth daily.    . hydrochlorothiazide (HYDRODIURIL) 25 MG tablet Take 25 mg by mouth daily.    Marland Kitchen HYDROcodone-homatropine (HYCODAN) 5-1.5 MG/5ML syrup     . lansoprazole (PREVACID SOLUTAB) 30 MG disintegrating tablet Take 30 mg by mouth daily.    . Multiple Vitamin (MULTIVITAMIN WITH MINERALS) TABS Take 1 tablet by mouth daily.    . valsartan (DIOVAN) 320 MG tablet Take 320 mg by mouth daily.    . verapamil (CALAN) 120 MG tablet Take 60 mg by mouth daily.     No current facility-administered medications for this visit.    Review of Systems Review of Systems  Constitutional: Negative.   Respiratory: Negative.   Cardiovascular: Negative.     Blood pressure 128/60, pulse 80, resp. rate 12, height $RemoveBe'5\' 5"'JKIPAWovo$  (1.651 m), weight 184 lb (83.462 kg).  Physical Exam Physical Exam  Constitutional: She is oriented to person, place, and time. She appears well-developed and well-nourished.  Pulmonary/Chest:    Neurological: She is alert and oriented to person, place, and time.  Skin: Skin is warm and dry.  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    Plan    The area was prepped with alcohol and 10 mL of 0.5% Xylocaine with 0.25% Marcaine with 1-200,000 of epinephrine was instilled and well tolerated. Chlor prep was applied to the skin. The area was excised through elliptical incision. A suture was placed on the medial border of the transversely oriented incision for pathology. The skin defect was closed with interrupted 4-0 nylon sutures. Telfa and Tegaderm applied. The procedure was well tolerated.     Suture removal in 9 days   PCP:  [name] 05/25/2015, 1:42 PM

## 2015-05-25 DIAGNOSIS — IMO0002 Reserved for concepts with insufficient information to code with codable children: Secondary | ICD-10-CM

## 2015-05-25 DIAGNOSIS — D099 Carcinoma in situ, unspecified: Secondary | ICD-10-CM | POA: Insufficient documentation

## 2015-05-25 HISTORY — DX: Reserved for concepts with insufficient information to code with codable children: IMO0002

## 2015-05-27 ENCOUNTER — Telehealth: Payer: Self-pay | Admitting: General Surgery

## 2015-05-27 ENCOUNTER — Telehealth: Payer: Self-pay | Admitting: *Deleted

## 2015-05-27 NOTE — Telephone Encounter (Signed)
Message left of positive margin requiring additional excision. Patient has been asked to return the call to schedule reexcision.

## 2015-05-27 NOTE — Telephone Encounter (Signed)
Patient called the office back and she is now scheduled for re-excision on Monday 05/30/15. We had a 2 hour gap so I put her on at 2:30 after your stereo.

## 2015-05-30 ENCOUNTER — Encounter: Payer: Self-pay | Admitting: General Surgery

## 2015-05-30 ENCOUNTER — Ambulatory Visit (INDEPENDENT_AMBULATORY_CARE_PROVIDER_SITE_OTHER): Payer: Medicare Other | Admitting: General Surgery

## 2015-05-30 VITALS — BP 128/70 | HR 71 | Resp 13 | Ht 65.0 in | Wt 185.0 lb

## 2015-05-30 DIAGNOSIS — D045 Carcinoma in situ of skin of trunk: Secondary | ICD-10-CM

## 2015-05-30 DIAGNOSIS — D099 Carcinoma in situ, unspecified: Secondary | ICD-10-CM

## 2015-05-30 NOTE — Patient Instructions (Addendum)
Leave dressing in place for one day. Return in one week for suture removal on 06/09/15 at 3 pm.

## 2015-05-30 NOTE — Progress Notes (Signed)
Patient ID: Hailey Ellison, female   DOB: Oct 08, 1935, 80 y.o.   MRN: [id number]  Chief Complaint  Patient presents with  . Procedure    reexcision of left breast tissue    HPI Hailey Ellison is a 80 y.o. female here for scheduled re excision of left breast skin in situ squamous carcinoma. Pathology had reported a positive margin at the 3-6:00 position (12:00 was the medial suture).Marland Kitchen  HPI  Past Medical History  Diagnosis Date  . Hypertension   . Murmur   . Radiation 2010    with chemo  . Arthritis   . Lymphedema   . Chronic kidney disease 2014  . Cancer 2010    T1c,N2a, M0; ER 90%, PR 60%,  HER-2/neu 2+ IHC, equivocal on fish. 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  Margins clear.  . Breast cancer     Adjuvant chemotherapy with Taxotere/Carboplatin/Herceptin. Arimidex initiated November 2011    Past Surgical History  Procedure Laterality Date  . Partial hysterectomy  1980's  . Carpal tunnel release Bilateral   . Port-a-cath removal    . Cholecystectomy    . Joint replacement Left 2008  . Breast biopsy Left 2010  . Breast cyst excision    . Breast biopsy Right   . Breast lumpectomy Left 2010  . Breast biopsy Left 05-12-15    SQUAMOUS CELL CARCINOMA IN SITU.    Family History  Problem Relation Age of Onset  . Cancer Sister     breast  . Cancer Sister     kidney    Social History History  Substance Use Topics  . Smoking status: Former Research scientist (life sciences)  . Smokeless tobacco: Never Used  . Alcohol Use: No    Allergies  Allergen Reactions  . Benadryl [Diphenhydramine Hcl] Hives  . Tape   . Tegaderm Ag Mesh [Silver]   . Neosporin [Neomycin-Bacitracin Zn-Polymyx] Rash    Current Outpatient Prescriptions  Medication Sig Dispense Refill  . anastrozole (ARIMIDEX) 1 MG tablet Take 1 mg by mouth daily.    . calcium citrate-vitamin D (CITRACAL+D) 315-200  MG-UNIT per tablet Take 1 tablet by mouth 2 (two) times daily.    Marland Kitchen dipyridamole-aspirin (AGGRENOX) 200-25 MG per 12 hr capsule Take 1 capsule by mouth 2 (two) times daily.    Marland Kitchen glucosamine-chondroitin 500-400 MG tablet Take 2 tablets by mouth daily.    . hydrochlorothiazide (HYDRODIURIL) 25 MG tablet Take 25 mg by mouth daily.    Marland Kitchen HYDROcodone-homatropine (HYCODAN) 5-1.5 MG/5ML syrup     . lansoprazole (PREVACID SOLUTAB) 30 MG disintegrating tablet Take 30 mg by mouth daily.    . Multiple Vitamin (MULTIVITAMIN WITH MINERALS) TABS Take 1 tablet by mouth daily.    . valsartan (DIOVAN) 320 MG tablet Take 320 mg by mouth daily.    . verapamil (CALAN) 120 MG tablet Take 60 mg by mouth daily.     No current facility-administered medications for this visit.    Review of Systems Review of Systems  Constitutional: Negative.   Respiratory: Negative.   Cardiovascular: Negative.     Blood pressure 128/70, pulse 71, resp. rate 13, height $RemoveBe'5\' 5"'VgPhJanuc$  (1.651 m), weight 185 lb (83.915 kg).  Physical Exam Physical Exam  Pulmonary/Chest:      Data Reviewed Pathology.  Assessment    Residual squamous cell carcinoma left chest wall.    Plan    The  procedure was reviewed with the patient. 10 mL of 0.5% Xylocaine with 0.25% Marcaine with 1-200,000 epinephrine was supplemented with 10 mL of 1% plain Xylocaine. The area was reexcised taking a 1 cm area from the superior edge of the original incision and a 2 mm rim from the inferior edge to encompass the area of concern on her recent wide excision. This was approximately 4 cm in length. The deep tissue was approximated with interrupted 3-0 Vicryls sutures. There is evidence of some fat necrosis status post radiation to the area. The skin was approximated with both simple and interrupted horizontal mattress sutures of 4-0 nylon. Telfa and tape dressing applied. The procedure was well tolerated. The patient declined any narcotic analgesia. She'll return in  7-10 days for suture removal with the staff.       Robert Bellow 05/31/2015, 8:20 AM

## 2015-06-02 ENCOUNTER — Telehealth: Payer: Self-pay | Admitting: *Deleted

## 2015-06-02 ENCOUNTER — Ambulatory Visit: Payer: Medicare Other

## 2015-06-02 NOTE — Telephone Encounter (Signed)
Notified patient as instructed, patient pleased. Discussed follow-up appointments, patient agrees  

## 2015-06-02 NOTE — Telephone Encounter (Signed)
-----   Message from Robert Bellow, MD sent at 06/02/2015  8:15 AM EDT ----- Please notify the patient that the reexcision was complete. No remaining skin cancer was identified. ----- Message -----    From: [facility] Interface    Sent: 06/01/2015   6:23 PM      To: Robert Bellow, MD

## 2015-06-07 ENCOUNTER — Ambulatory Visit (INDEPENDENT_AMBULATORY_CARE_PROVIDER_SITE_OTHER): Payer: Medicare Other | Admitting: Podiatry

## 2015-06-07 DIAGNOSIS — B351 Tinea unguium: Secondary | ICD-10-CM

## 2015-06-07 DIAGNOSIS — M79676 Pain in unspecified toe(s): Secondary | ICD-10-CM | POA: Diagnosis not present

## 2015-06-07 NOTE — Progress Notes (Signed)
Subjective: 80 y.o.-year-old female returns the office today for painful, elongated, thickened toenails which she is unable to do herself. Denies any redness or drainage around the nails. She states the pain to the right heel is doing much better. She gets some intermittenet discomfort to the area but overall much improved. She continues his stretching and icing when area flares up. Denies any acute changes since last appointment and no new complaints today. Denies any systemic complaints such as fevers, chills, nausea, vomiting.   Objective: AAO 3, NAD DP/PT pulses palpable, CRT less than 3 seconds Protective sensation intact with Simms Weinstein monofilament, Achilles tendon reflex intact.  Nails hypertrophic, dystrophic, elongated, brittle, discolored 10. There is tenderness overlying the nails 1-5 bilaterally. There is no surrounding erythema or drainage along the nail sites. No open lesions or pre-ulcerative lesions are identified. Currently, there is no tears palpation along the course of the Achilles tendon bilaterally or along the insertion into the calcaneus.  No other areas of tenderness bilateral lower extremities. No overlying edema, erythema, increased warmth. No pain with calf compression, swelling, warmth, erythema.  Assessment: Patient presents with symptomatic onychomycosis; resolved right achilles tendonitis.   Plan: -Treatment options including alternatives, risks, complications were discussed -Nails sharply debrided 10 without complication/bleeding. -Discussed daily foot inspection. If there are any changes, to call the office immediately.  -Follow-up in 3 months or sooner if any problems are to arise. In the meantime, encouraged to call the office with any questions, concerns, changes symptoms.

## 2015-06-09 ENCOUNTER — Ambulatory Visit (INDEPENDENT_AMBULATORY_CARE_PROVIDER_SITE_OTHER): Payer: Medicare Other | Admitting: *Deleted

## 2015-06-09 DIAGNOSIS — D099 Carcinoma in situ, unspecified: Secondary | ICD-10-CM

## 2015-06-09 NOTE — Progress Notes (Signed)
Patient came in today for a wound check left chest wall .  The wound is clean, with no signs of infection noted. The sutures were removed and steri strips applied.

## 2015-06-15 ENCOUNTER — Telehealth: Payer: Self-pay | Admitting: *Deleted

## 2015-06-15 NOTE — Telephone Encounter (Signed)
Patient called wanted to talk to you about her BX on LT Breast, she stated that you took the sutures out so she wanted to ask you a few questions.

## 2015-06-15 NOTE — Telephone Encounter (Signed)
appt for am

## 2015-06-16 ENCOUNTER — Ambulatory Visit (INDEPENDENT_AMBULATORY_CARE_PROVIDER_SITE_OTHER): Payer: Medicare Other | Admitting: *Deleted

## 2015-06-16 DIAGNOSIS — R229 Localized swelling, mass and lump, unspecified: Secondary | ICD-10-CM

## 2015-06-16 NOTE — Patient Instructions (Signed)
The patient is aware to call back for any questions or concerns.  

## 2015-06-16 NOTE — Progress Notes (Signed)
Patient came in today for a wound check.  She had called to say that there was a small open area present.The wound is clean, with no signs of infection noted. Small granulation tissue visible, silver  Nitrate used. Advised to use neosporin and gauze daily at home.  She will call back for new concerns. Follow up as scheduled.

## 2015-07-25 ENCOUNTER — Ambulatory Visit (INDEPENDENT_AMBULATORY_CARE_PROVIDER_SITE_OTHER): Payer: Medicare Other | Admitting: General Surgery

## 2015-07-25 ENCOUNTER — Encounter: Payer: Self-pay | Admitting: General Surgery

## 2015-07-25 VITALS — BP 128/68 | HR 72 | Resp 14 | Ht 65.0 in | Wt 183.0 lb

## 2015-07-25 DIAGNOSIS — N641 Fat necrosis of breast: Secondary | ICD-10-CM

## 2015-07-25 DIAGNOSIS — N611 Abscess of the breast and nipple: Secondary | ICD-10-CM

## 2015-07-25 DIAGNOSIS — N61 Inflammatory disorders of breast: Secondary | ICD-10-CM

## 2015-07-25 NOTE — Progress Notes (Signed)
Patient ID: Hailey Ellison, female   DOB: 01-Jul-1935, 80 y.o.   MRN: [id number]  Chief Complaint  Patient presents with  . Follow-up    left excision site    HPI Hailey Ellison is a 80 y.o. female here today to have her left breast excision site is still open and now has started draining. She reports that she started to notice a bad smell and clear drainage with some pus.  HPI  Past Medical History  Diagnosis Date  . Hypertension   . Murmur   . Radiation 2010    with chemo  . Arthritis   . Lymphedema   . Chronic kidney disease 2014  . Cancer 2010    T1c,N2a, M0; ER 90%, PR 60%,  HER-2/neu 2+ IHC, equivocal on fish. 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  Margins clear.  . Breast cancer     Adjuvant chemotherapy with Taxotere/Carboplatin/Herceptin. Arimidex initiated November 2011    Past Surgical History  Procedure Laterality Date  . Partial hysterectomy  1980's  . Carpal tunnel release Bilateral   . Port-a-cath removal    . Cholecystectomy    . Joint replacement Left 2008  . Breast biopsy Left 2010  . Breast cyst excision    . Breast biopsy Right   . Breast lumpectomy Left 2010  . Breast biopsy Left 05-12-15    SQUAMOUS CELL CARCINOMA IN SITU.    Family History  Problem Relation Age of Onset  . Cancer Sister     breast  . Cancer Sister     kidney    Social History History  Substance Use Topics  . Smoking status: Former Research scientist (life sciences)  . Smokeless tobacco: Never Used  . Alcohol Use: No    Allergies  Allergen Reactions  . Benadryl [Diphenhydramine Hcl] Hives  . Tape   . Tegaderm Ag Mesh [Silver]   . Neosporin [Neomycin-Bacitracin Zn-Polymyx] Rash    Current Outpatient Prescriptions  Medication Sig Dispense Refill  . anastrozole (ARIMIDEX) 1 MG tablet Take 1 mg by mouth daily.    . calcium citrate-vitamin D (CITRACAL+D) 315-200 MG-UNIT  per tablet Take 1 tablet by mouth 2 (two) times daily.    Marland Kitchen dipyridamole-aspirin (AGGRENOX) 200-25 MG per 12 hr capsule Take 1 capsule by mouth 2 (two) times daily.    Marland Kitchen glucosamine-chondroitin 500-400 MG tablet Take 2 tablets by mouth daily.    . hydrochlorothiazide (HYDRODIURIL) 25 MG tablet Take 25 mg by mouth daily.    . lansoprazole (PREVACID SOLUTAB) 30 MG disintegrating tablet Take 30 mg by mouth daily.    . Multiple Vitamin (MULTIVITAMIN WITH MINERALS) TABS Take 1 tablet by mouth daily.    . valsartan (DIOVAN) 320 MG tablet Take 320 mg by mouth daily.    . verapamil (CALAN) 120 MG tablet Take 60 mg by mouth daily.     No current facility-administered medications for this visit.    Review of Systems Review of Systems  Constitutional: Negative.   Respiratory: Negative.   Cardiovascular: Negative.     Blood pressure 128/68, pulse 72, resp. rate 14, height 5' 5" (1.651 m), weight 183 lb (83.008 kg).  Physical Exam Physical Exam  Constitutional: She is oriented to person, place, and time. She appears well-developed and well-nourished.  Pulmonary/Chest:    Neurological: She is alert and oriented to person, place, and time.  Skin: Skin is warm and  dry.  Psychiatric: She has a normal mood and affect.      Assessment    Delayed healing likely secondary to fat necrosis of the underlying soft tissue.    Plan    Will hold anabiotic therapy pending culture results. Local care with daily showers and bacitracin application will be undertaken. Follow-up examination in 2 weeks.      PCP:  [name] 07/26/2015, 6:28 AM

## 2015-07-26 DIAGNOSIS — N641 Fat necrosis of breast: Secondary | ICD-10-CM | POA: Insufficient documentation

## 2015-07-31 LAB — ANAEROBIC AND AEROBIC CULTURE

## 2015-08-01 ENCOUNTER — Telehealth: Payer: Self-pay

## 2015-08-01 DIAGNOSIS — N611 Abscess of the breast and nipple: Secondary | ICD-10-CM

## 2015-08-01 MED ORDER — AMOXICILLIN-POT CLAVULANATE 875-125 MG PO TABS: 1.0000 | ORAL_TABLET | Freq: Two times a day (BID) | ORAL | Status: DC

## 2015-08-01 NOTE — Telephone Encounter (Signed)
-----   Message from Robert Bellow, MD sent at 08/01/2015  9:29 AM EDT ----- Please notify the patient that I would like her to start Augmentin, 875 mg 2 x/ day for 10 days based on her recent culture.  ----- Message -----    From: [facility] Lab Results In Interface    Sent: 07/31/2015   6:40 AM      To: Robert Bellow, MD

## 2015-08-01 NOTE — Telephone Encounter (Signed)
Notified patient as instructed, patient pleased. Discussed follow-up appointments, patient agrees. Augmentin 875 sent into patients pharmacy.

## 2015-08-09 ENCOUNTER — Encounter: Payer: Self-pay | Admitting: General Surgery

## 2015-08-09 ENCOUNTER — Ambulatory Visit (INDEPENDENT_AMBULATORY_CARE_PROVIDER_SITE_OTHER): Payer: Medicare Other | Admitting: General Surgery

## 2015-08-09 VITALS — BP 112/64 | HR 78 | Resp 14 | Ht 65.0 in | Wt 182.0 lb

## 2015-08-09 DIAGNOSIS — N61 Inflammatory disorders of breast: Secondary | ICD-10-CM | POA: Diagnosis not present

## 2015-08-09 DIAGNOSIS — N611 Abscess of the breast and nipple: Secondary | ICD-10-CM

## 2015-08-09 NOTE — Patient Instructions (Addendum)
The patient is aware to call back for any questions or concerns. Follow up nurse one week, MD 2 weeks. Call on Thursday if rectal bleeding continues or any other symptoms, abdominal pain etc.

## 2015-08-09 NOTE — Progress Notes (Addendum)
Patient ID: Hailey Ellison, female   DOB: Oct 29, 1935, 80 y.o.   MRN: [id number]  Chief Complaint  Patient presents with  . Follow-up    HPI Hailey Ellison is a 80 y.o. female.  Here today for follow up left breast abscess. She states the left breast abscess area is still draining a little.  She did have a fall Sunday. She has noticed her bowels have been a little loose since she has been on the antibiotics and she noticed rectal bleeding with her BM this morning. She did not take her antibiotic today thinking that it maybe triggering the bleeding.   HPI  Past Medical History  Diagnosis Date  . Hypertension   . Murmur   . Radiation 2010    with chemo  . Arthritis   . Lymphedema   . Chronic kidney disease 2014  . Cancer 2010    T1c,N2a, M0; ER 90%, PR 60%,  HER-2/neu 2+ IHC, equivocal on fish. 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  Margins clear.  . Breast cancer     Adjuvant chemotherapy with Taxotere/Carboplatin/Herceptin. Arimidex initiated November 2011    Past Surgical History  Procedure Laterality Date  . Partial hysterectomy  1980's  . Carpal tunnel release Bilateral   . Port-a-cath removal    . Cholecystectomy    . Joint replacement Left 2008  . Breast biopsy Left 2010  . Breast cyst excision    . Breast biopsy Right   . Breast lumpectomy Left 2010  . Breast biopsy Left 05-12-15    SQUAMOUS CELL CARCINOMA IN SITU.    Family History  Problem Relation Age of Onset  . Cancer Sister     breast  . Cancer Sister     kidney    Social History Social History  Substance Use Topics  . Smoking status: Former Games developer  . Smokeless tobacco: Never Used  . Alcohol Use: No    Allergies  Allergen Reactions  . Benadryl [Diphenhydramine Hcl] Hives  . Tape   . Tegaderm Ag Mesh [Silver]     tegaderm  . Neosporin [Neomycin-Bacitracin Zn-Polymyx]  Rash    Current Outpatient Prescriptions  Medication Sig Dispense Refill  . amoxicillin-clavulanate (AUGMENTIN) 875-125 MG per tablet Take 1 tablet by mouth 2 (two) times daily. (Patient not taking: Reported on 08/22/2015) 20 tablet 0  . anastrozole (ARIMIDEX) 1 MG tablet Take 1 mg by mouth daily.    . calcium citrate-vitamin D (CITRACAL+D) 315-200 MG-UNIT per tablet Take 1 tablet by mouth 2 (two) times daily.    Marland Kitchen dipyridamole-aspirin (AGGRENOX) 200-25 MG per 12 hr capsule Take 1 capsule by mouth 2 (two) times daily.    Marland Kitchen glucosamine-chondroitin 500-400 MG tablet Take 2 tablets by mouth daily.    . hydrochlorothiazide (HYDRODIURIL) 25 MG tablet Take 25 mg by mouth daily.    . lansoprazole (PREVACID SOLUTAB) 30 MG disintegrating tablet Take 30 mg by mouth daily.    . Multiple Vitamin (MULTIVITAMIN WITH MINERALS) TABS Take 1 tablet by mouth daily.    . valsartan (DIOVAN) 320 MG tablet Take 320 mg by mouth daily.    . verapamil (CALAN) 120 MG tablet Take 60 mg by mouth daily.     No current facility-administered medications for this visit.    Review of Systems Review of Systems  Gastrointestinal: Positive for anal bleeding. Negative for nausea, diarrhea and constipation.  Blood pressure 112/64, pulse 78, resp. rate 14, height $RemoveBe'5\' 5"'nuELhmHVL$  (1.651 m), weight 182 lb (82.555 kg).  Physical Exam Physical Exam  Pulmonary/Chest:        Assessment    Slow healing secondary to underlying fat necrosis status post excision of squamous cell carcinoma from the left breast skin.    Plan    I expect the bleeding patient report is from an anorectal source, with increased stool frequency after anti-biotic therapy. As she is asymptomatic, we will wait 24-48 hours before an investigation. She will call if she has persistent bleeding over the next 48 hours or if she develops any lower abdominal pain to suggest diverticulitis.    Silver nitrate to the left breast area, continue by nurse next  week. Follow up nurse one week, MD 2 weeks. Call on Thursday if rectal bleeding continues or any other symptoms, abdominal pain etc.  PCP:  [name] 09/14/2015, 4:42 PM

## 2015-08-17 ENCOUNTER — Ambulatory Visit (INDEPENDENT_AMBULATORY_CARE_PROVIDER_SITE_OTHER): Payer: Medicare Other | Admitting: *Deleted

## 2015-08-17 DIAGNOSIS — N61 Inflammatory disorders of breast: Secondary | ICD-10-CM

## 2015-08-17 NOTE — Progress Notes (Signed)
Patient came in today for a wound check/silver nitrate applied to left breast area. No change from before, she still sees a small amount of drainage with her dressing changes.  The wound is clean. She is getting frustrated that the wound has not healed. Follow up as scheduled Monday.

## 2015-08-17 NOTE — Patient Instructions (Signed)
The patient is aware to call back for any questions or concerns.  

## 2015-08-22 ENCOUNTER — Encounter: Payer: Self-pay | Admitting: General Surgery

## 2015-08-22 ENCOUNTER — Ambulatory Visit (INDEPENDENT_AMBULATORY_CARE_PROVIDER_SITE_OTHER): Payer: Medicare Other | Admitting: General Surgery

## 2015-08-22 VITALS — BP 126/72 | HR 66 | Resp 16 | Ht 65.0 in | Wt 184.4 lb

## 2015-08-22 DIAGNOSIS — N641 Fat necrosis of breast: Secondary | ICD-10-CM

## 2015-08-22 NOTE — Progress Notes (Signed)
Patient ID: AVARAE ZWART, female   DOB: 12-Apr-1935, 80 y.o.   MRN: [id number]  Chief Complaint  Patient presents with  . Follow-up    breast abcess    HPI JAMIYAH DINGLEY is a 80 y.o. female. Here today for follow up left breast abscess. Patient states the affected area is getting some better but it is not healed. There is some drainage with a pink tinge on the band aid. There is a 66m opening HPI  Past Medical History  Diagnosis Date  . Hypertension   . Murmur   . Radiation 2010    with chemo  . Arthritis   . Lymphedema   . Chronic kidney disease 2014  . Cancer 2010    T1c,N2a, M0; ER 90%, PR 60%,  HER-2/neu 2+ IHC, equivocal on fish. 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  Margins clear.  . Breast cancer     Adjuvant chemotherapy with Taxotere/Carboplatin/Herceptin. Arimidex initiated November 2011    Past Surgical History  Procedure Laterality Date  . Partial hysterectomy  1980's  . Carpal tunnel release Bilateral   . Port-a-cath removal    . Cholecystectomy    . Joint replacement Left 2008  . Breast biopsy Left 2010  . Breast cyst excision    . Breast biopsy Right   . Breast lumpectomy Left 2010  . Breast biopsy Left 05-12-15    SQUAMOUS CELL CARCINOMA IN SITU.    Family History  Problem Relation Age of Onset  . Cancer Sister     breast  . Cancer Sister     kidney    Social History Social History  Substance Use Topics  . Smoking status: Former SResearch scientist (life sciences) . Smokeless tobacco: Never Used  . Alcohol Use: No    Allergies  Allergen Reactions  . Benadryl [Diphenhydramine Hcl] Hives  . Tape   . Tegaderm Ag Mesh [Silver]   . Neosporin [Neomycin-Bacitracin Zn-Polymyx] Rash    Current Outpatient Prescriptions  Medication Sig Dispense Refill  . anastrozole (ARIMIDEX) 1 MG tablet Take 1 mg by mouth daily.    . calcium citrate-vitamin D  (CITRACAL+D) 315-200 MG-UNIT per tablet Take 1 tablet by mouth 2 (two) times daily.    .Marland Kitchendipyridamole-aspirin (AGGRENOX) 200-25 MG per 12 hr capsule Take 1 capsule by mouth 2 (two) times daily.    .Marland Kitchenglucosamine-chondroitin 500-400 MG tablet Take 2 tablets by mouth daily.    . hydrochlorothiazide (HYDRODIURIL) 25 MG tablet Take 25 mg by mouth daily.    . lansoprazole (PREVACID SOLUTAB) 30 MG disintegrating tablet Take 30 mg by mouth daily.    . Multiple Vitamin (MULTIVITAMIN WITH MINERALS) TABS Take 1 tablet by mouth daily.    . valsartan (DIOVAN) 320 MG tablet Take 320 mg by mouth daily.    . verapamil (CALAN) 120 MG tablet Take 60 mg by mouth daily.    .Marland Kitchenamoxicillin-clavulanate (AUGMENTIN) 875-125 MG per tablet Take 1 tablet by mouth 2 (two) times daily. (Patient not taking: Reported on 08/22/2015) 20 tablet 0   No current facility-administered medications for this visit.    Review of Systems Review of Systems  Constitutional: Negative.   Respiratory: Negative.   Cardiovascular: Negative.     Blood pressure 126/72, pulse 66, resp. rate 16, height _0  (1.651 m), weight 184 lb 6.4 oz (83.643 kg).  Physical Exam Physical Exam  Pulmonary/Chest: Effort normal.  Psychiatric: She has a normal mood and affect.       Assessment    Slow progress in healing of left squamous cell carcinoma excision site.    Plan    The patient is significantly improved over her last visit. We'll continue conservative measures. She'll make use of Polysporin after her daily shower and keep the area covered to minimize clothing lint getting into the wound.     [id number], Marrion Coy 08/22/2015, 4:47 PM

## 2015-08-30 ENCOUNTER — Ambulatory Visit (INDEPENDENT_AMBULATORY_CARE_PROVIDER_SITE_OTHER): Payer: Medicare Other | Admitting: *Deleted

## 2015-08-30 DIAGNOSIS — N641 Fat necrosis of breast: Secondary | ICD-10-CM

## 2015-08-30 NOTE — Patient Instructions (Signed)
The patient is aware to call back for any questions or concerns.  

## 2015-08-30 NOTE — Progress Notes (Signed)
Patient came in today for a wound check.  Silver nitrate applied to a small open area left breast. The wound is clean, with no signs of infection noted. Follow up as scheduled.

## 2015-09-02 ENCOUNTER — Other Ambulatory Visit: Payer: Self-pay

## 2015-09-02 ENCOUNTER — Inpatient Hospital Stay (HOSPITAL_BASED_OUTPATIENT_CLINIC_OR_DEPARTMENT_OTHER): Payer: Medicare Other | Admitting: Internal Medicine

## 2015-09-02 ENCOUNTER — Inpatient Hospital Stay: Payer: Medicare Other | Attending: Internal Medicine

## 2015-09-02 VITALS — BP 133/79 | HR 69 | Temp 97.9°F | Resp 18 | Ht 65.0 in | Wt 185.6 lb

## 2015-09-02 DIAGNOSIS — Z170 Estrogen receptor positive status [ER+]: Secondary | ICD-10-CM | POA: Diagnosis not present

## 2015-09-02 DIAGNOSIS — Z87891 Personal history of nicotine dependence: Secondary | ICD-10-CM | POA: Diagnosis not present

## 2015-09-02 DIAGNOSIS — I890 Lymphedema, not elsewhere classified: Secondary | ICD-10-CM | POA: Insufficient documentation

## 2015-09-02 DIAGNOSIS — M8580 Other specified disorders of bone density and structure, unspecified site: Secondary | ICD-10-CM

## 2015-09-02 DIAGNOSIS — N189 Chronic kidney disease, unspecified: Secondary | ICD-10-CM | POA: Diagnosis not present

## 2015-09-02 DIAGNOSIS — Z923 Personal history of irradiation: Secondary | ICD-10-CM | POA: Insufficient documentation

## 2015-09-02 DIAGNOSIS — Z9221 Personal history of antineoplastic chemotherapy: Secondary | ICD-10-CM

## 2015-09-02 DIAGNOSIS — Z79899 Other long term (current) drug therapy: Secondary | ICD-10-CM | POA: Insufficient documentation

## 2015-09-02 DIAGNOSIS — Z79811 Long term (current) use of aromatase inhibitors: Secondary | ICD-10-CM

## 2015-09-02 DIAGNOSIS — K219 Gastro-esophageal reflux disease without esophagitis: Secondary | ICD-10-CM

## 2015-09-02 DIAGNOSIS — C50912 Malignant neoplasm of unspecified site of left female breast: Secondary | ICD-10-CM | POA: Insufficient documentation

## 2015-09-02 DIAGNOSIS — I129 Hypertensive chronic kidney disease with stage 1 through stage 4 chronic kidney disease, or unspecified chronic kidney disease: Secondary | ICD-10-CM | POA: Diagnosis not present

## 2015-09-02 LAB — BASIC METABOLIC PANEL
Anion gap: 3 — ABNORMAL LOW (ref 5–15)
BUN: 30 mg/dL — AB (ref 6–20)
CO2: 26 mmol/L (ref 22–32)
Calcium: 8.7 mg/dL — ABNORMAL LOW (ref 8.9–10.3)
Chloride: 110 mmol/L (ref 101–111)
Creatinine, Ser: 1.66 mg/dL — ABNORMAL HIGH (ref 0.44–1.00)
GFR, EST AFRICAN AMERICAN: 33 mL/min — AB (ref >60)
GFR, EST NON AFRICAN AMERICAN: 29 mL/min — AB (ref >60)
Glucose, Bld: 135 mg/dL — ABNORMAL HIGH (ref 65–99)
POTASSIUM: 4.6 mmol/L (ref 3.5–5.1)
SODIUM: 139 mmol/L (ref 135–145)

## 2015-09-02 LAB — CBC WITH DIFFERENTIAL/PLATELET
BASOS ABS: 0 10*3/uL (ref 0–0.1)
BASOS PCT: 0 %
EOS ABS: 0.1 10*3/uL (ref 0–0.7)
EOS PCT: 2 %
HCT: 36.7 % (ref 35.0–47.0)
HEMOGLOBIN: 12.6 g/dL (ref 12.0–16.0)
LYMPHS ABS: 1.3 10*3/uL (ref 1.0–3.6)
Lymphocytes Relative: 29 %
MCH: 33.2 pg (ref 26.0–34.0)
MCHC: 34.3 g/dL (ref 32.0–36.0)
MCV: 96.9 fL (ref 80.0–100.0)
Monocytes Absolute: 0.3 10*3/uL (ref 0.2–0.9)
Monocytes Relative: 6 %
NEUTROS PCT: 63 %
Neutro Abs: 2.8 10*3/uL (ref 1.4–6.5)
PLATELETS: 151 10*3/uL (ref 150–440)
RBC: 3.79 MIL/uL — AB (ref 3.80–5.20)
RDW: 12.2 % (ref 11.5–14.5)
WBC: 4.6 10*3/uL (ref 3.6–11.0)

## 2015-09-02 LAB — HEPATIC FUNCTION PANEL
ALBUMIN: 4 g/dL (ref 3.5–5.0)
ALT: 24 U/L (ref 14–54)
AST: 32 U/L (ref 15–41)
Alkaline Phosphatase: 76 U/L (ref 38–126)
BILIRUBIN INDIRECT: 0.7 mg/dL (ref 0.3–0.9)
Bilirubin, Direct: 0.1 mg/dL (ref 0.1–0.5)
TOTAL PROTEIN: 6.3 g/dL — AB (ref 6.5–8.1)
Total Bilirubin: 0.8 mg/dL (ref 0.3–1.2)

## 2015-09-03 LAB — CANCER ANTIGEN 27.29: CA 27.29: 24.4 U/mL (ref 0.0–38.6)

## 2015-09-05 ENCOUNTER — Ambulatory Visit (INDEPENDENT_AMBULATORY_CARE_PROVIDER_SITE_OTHER): Payer: Medicare Other | Admitting: Podiatry

## 2015-09-05 DIAGNOSIS — B351 Tinea unguium: Secondary | ICD-10-CM

## 2015-09-05 DIAGNOSIS — M79676 Pain in unspecified toe(s): Secondary | ICD-10-CM

## 2015-09-05 NOTE — Progress Notes (Signed)
She presents today chief complaint of painful elongated toenails.  Objective: Pulses are palpable bilateral. Nails are thick yellow dystrophic with mycotic and painful palpation.  Assessment: Pain in limb secondary to onychomycosis 1 through 5 bilateral.  Plan: Treatment of nails 1-5 bilateral. 3 mo.

## 2015-09-06 ENCOUNTER — Ambulatory Visit (INDEPENDENT_AMBULATORY_CARE_PROVIDER_SITE_OTHER): Payer: Medicare Other | Admitting: *Deleted

## 2015-09-06 DIAGNOSIS — N61 Inflammatory disorders of breast: Secondary | ICD-10-CM

## 2015-09-06 DIAGNOSIS — N611 Abscess of the breast and nipple: Secondary | ICD-10-CM

## 2015-09-06 NOTE — Patient Instructions (Signed)
The patient is aware to call back for any questions or concerns.  

## 2015-09-06 NOTE — Progress Notes (Signed)
Patient came in today for a wound check. Silver nitrate applied to a small open area left breast ( less than 0.62mm). Visually it looks smaller than before. The wound is clean, with no signs of infection noted. Follow up as scheduled.

## 2015-09-08 ENCOUNTER — Ambulatory Visit: Payer: Medicare Other

## 2015-09-13 ENCOUNTER — Ambulatory Visit: Payer: Medicare Other

## 2015-09-13 ENCOUNTER — Ambulatory Visit (INDEPENDENT_AMBULATORY_CARE_PROVIDER_SITE_OTHER): Payer: Medicare Other | Admitting: *Deleted

## 2015-09-13 DIAGNOSIS — N611 Abscess of the breast and nipple: Secondary | ICD-10-CM

## 2015-09-13 DIAGNOSIS — N61 Inflammatory disorders of breast: Secondary | ICD-10-CM

## 2015-09-13 NOTE — Progress Notes (Addendum)
Patient came in today for a wound check.  The wound is clean, with no signs of infection noted. Silver nitrate used, open area same as last week (smaller that tip of a Q Tip) Follow up as scheduled in one month with MD. Durene Cal for medihoney gel at [place], apply BID.

## 2015-09-13 NOTE — Patient Instructions (Signed)
The patient is aware to call back for any questions or concerns.  

## 2015-09-14 ENCOUNTER — Ambulatory Visit
Admission: RE | Admit: 2015-09-14 | Discharge: 2015-09-14 | Disposition: A | Discharge status: Home / Self Care | Payer: Medicare Other | Source: Ambulatory Visit | Attending: Internal Medicine | Admitting: Internal Medicine

## 2015-09-14 DIAGNOSIS — C50912 Malignant neoplasm of unspecified site of left female breast: Secondary | ICD-10-CM | POA: Diagnosis not present

## 2015-09-14 DIAGNOSIS — M85862 Other specified disorders of bone density and structure, left lower leg: Secondary | ICD-10-CM | POA: Diagnosis not present

## 2015-09-14 DIAGNOSIS — M8580 Other specified disorders of bone density and structure, unspecified site: Secondary | ICD-10-CM | POA: Diagnosis present

## 2015-09-18 NOTE — Progress Notes (Signed)
[place]  [phone] [phone]     ID: Hailey Ellison OB: 04-Aug-1935  MR#: [id number]  DVV#:[id number]  Patient Care Team: [name], MD as PCP - General (Family Medicine) Robert Bellow, MD (General Surgery) Melynda Ripple, MD (Internal Medicine)  CHIEF COMPLAINT/DIAGNOSIS:  T1N2M0 (clinical stage IIIA) grade 3 invasive ductal carcinoma of the left breast status post lumpectomy, sentinel node and axillary node dissection March 09, 2009.  Margins clear. 6 axillary lymph nodes positive for metastasis Tumor size 1.2 cm ER-positive (greater than 90%), PR-positive (60%) HER-2/neu 2+ on IHC, Equivocal on FISH, HER-2/CEP 17 ratio high at 1.97 Got adjuvant chemotherapy with Taxotere/Carboplatin/Herceptin. Started Anastrazole in Aug 2011.   HISTORY OF PRESENT ILLNESS:  Patient returns for continued oncology followup, was last seen one year ago. States that she is doing steady, lymphedema doing better with using sleeve. States that she is also seeing urologist Dr. Bernardo Heater for right kidney mass. Otherwise no new complaints. Eating steady, denies weight loss. Denies feeling any new breast masses on self-exam. States that she is tolerating anastrozole without new side effects. No new bone pains. No new mood disturbances.   REVIEW OF SYSTEMS:   ROS As in HPI above. In addition, no fever, chills or sweats. No new headaches or focal weakness.  No new sore throat, cough, shortness of breath, sputum, hemoptysis or chest pain. No dizziness or palpitation. No abdominal pain, constipation, diarrhea, dysuria or hematuria. No new skin rash or bleeding symptoms. No new paresthesias in extremities.   PAST MEDICAL HISTORY: Reviewed. Past Medical History  Diagnosis Date  . Hypertension   . Murmur   . Radiation 2010    with chemo  . Arthritis   . Lymphedema   . Chronic kidney disease 2014  . Cancer 2010    T1c,N2a, M0; ER 90%, PR 60%,  HER-2/neu 2+ IHC,  equivocal on fish. 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  Margins clear.  . Breast cancer     Adjuvant chemotherapy with Taxotere/Carboplatin/Herceptin. Arimidex initiated November 2011          Hypertension  GERD  T1N2 grade 3 invasive ductal carcinoma of the left breast status post lumpectomy, sentinel node and axillary node dissection March 2010  Partial Hysterectomy    Cholecystectomy        PAST SURGICAL HISTORY: Reviewed. Past Surgical History  Procedure Laterality Date  . Partial hysterectomy  1980's  . Carpal tunnel release Bilateral   . Port-a-cath removal    . Cholecystectomy    . Joint replacement Left 2008  . Breast biopsy Left 2010  . Breast cyst excision    . Breast biopsy Right   . Breast lumpectomy Left 2010  . Breast biopsy Left 05-12-15    SQUAMOUS CELL CARCINOMA IN SITU.    FAMILY HISTORY: Reviewed. Family History  Problem Relation Age of Onset  . Cancer Sister     breast  . Cancer Sister     kidney    SOCIAL HISTORY: Reviewed. Social History  Substance Use Topics  . Smoking status: Former Research scientist (life sciences)  . Smokeless tobacco: Never Used  . Alcohol Use: No    Allergies  Allergen Reactions  . Benadryl [Diphenhydramine Hcl] Hives  . Tape   . Tegaderm Ag Mesh [Silver]     tegaderm  . Neosporin [Neomycin-Bacitracin Zn-Polymyx] Rash    Current Outpatient Prescriptions  Medication Sig Dispense Refill  . anastrozole (ARIMIDEX) 1 MG tablet Take 1 mg by mouth daily.    . calcium citrate-vitamin D (CITRACAL+D) 315-200 MG-UNIT per tablet Take 1 tablet by mouth 2 (two) times daily.    . dipyridamole-aspirin (AGGRENOX) 200-25 MG per 12 hr capsule Take 1 capsule by mouth 2 (two) times daily.    . glucosamine-chondroitin 500-400 MG tablet Take 2 tablets by mouth daily.    . lansoprazole (PREVACID SOLUTAB) 30 MG disintegrating tablet Take 30  mg by mouth daily.    . Multiple Vitamin (MULTIVITAMIN WITH MINERALS) TABS Take 1 tablet by mouth daily.    . valsartan (DIOVAN) 320 MG tablet Take 320 mg by mouth daily.    . verapamil (CALAN) 120 MG tablet Take 60 mg by mouth daily.    . amoxicillin-clavulanate (AUGMENTIN) 875-125 MG per tablet Take 1 tablet by mouth 2 (two) times daily. (Patient not taking: Reported on 08/22/2015) 20 tablet 0  . hydrochlorothiazide (HYDRODIURIL) 25 MG tablet Take 25 mg by mouth daily.     No current facility-administered medications for this visit.    PHYSICAL EXAM: Filed Vitals:   09/02/15 1518  BP: 133/79  Pulse: 69  Temp: 97.9 F (36.6 C)  Resp: 18     Body mass index is 30.89 kg/(m^2).       GENERAL: Alert and oriented and in no acute distress. No icterus. HEENT: EOMs intact. No cervical lymphadenopathy. CVS: S1S2, regular LUNGS: Bilaterally clear to auscultation, no rhonchi. ABDOMEN: Soft, nontender. No hepatomegaly clinically.  EXTREMITIES: No pedal edema. BREASTS:  left breast thickening/scar tissue palpable, no dominant masses noted otherwise. Right breast negative for masses. No axillary adenopathy on either side.  Exam performed in presence of a nurse.   LAB RESULTS:    Component Value Date/Time   NA 139 09/02/2015 1445   NA 143 08/25/2014 1056   K 4.6 09/02/2015 1445   K 4.3 08/25/2014 1056   CL 110 09/02/2015 1445   CL 107 08/25/2014 1056   CO2 26 09/02/2015 1445   CO2 27 08/25/2014 1056   GLUCOSE 135* 09/02/2015 1445   GLUCOSE 116* 08/25/2014 1056   BUN 30* 09/02/2015 1445   BUN 30* 08/25/2014 1056   CREATININE 1.66* 09/02/2015 1445   CREATININE 1.78* 08/25/2014 1056   CALCIUM 8.7* 09/02/2015 1445   CALCIUM 8.8 08/25/2014 1056   PROT 6.3* 09/02/2015 1445   PROT 6.3* 08/25/2014 1056   ALBUMIN 4.0 09/02/2015 1445   ALBUMIN 3.7 08/25/2014 1056   AST 32 09/02/2015 1445   AST 24 08/25/2014 1056   ALT 24 09/02/2015 1445   ALT 22 08/25/2014 1056   ALKPHOS 76 09/02/2015  1445   ALKPHOS 68 08/25/2014 1056   BILITOT 0.8 09/02/2015 1445   BILITOT 0.6 08/25/2014 1056   GFRNONAA 29* 09/02/2015 1445   GFRNONAA 27* 08/25/2014 1056   GFRAA 33* 09/02/2015 1445   GFRAA 32* 08/25/2014 1056    Lab Results  Component Value Date   WBC 4.6 09/02/2015   NEUTROABS 2.8 09/02/2015   HGB 12.6 09/02/2015   HCT 36.7 09/02/2015   MCV 96.9 09/02/2015   PLT 151 09/02/2015   09/02/15 - serum CA 27.29 is 24.4.  STUDIES: 08/16/14 - CT scan of the chest without contrast. IMPRESSION: No acute cardiopulmonary disease. Left main and 3 vessel coronary artery disease. Mild aneurysmal dilatation of the ascending thoracic aorta measuring 4.3 cm in AP diameter. Two subcentimeter liver cysts unchanged.  03/18/14 - CT scan of   the abdomen/pelvis without contrast. IMPRESSION:  16 mm right kidney exophytic lower pole indeterminate hypodense lesion by noncontrast CT. Differential considerations include complex cyst versus a solid renal mass. This warrants further evaluation with either contrast CT or MRI of the abdomen.  21 mm left upper pole hypodense indeterminate lesion by noncontrast CT as well. This has lower Hounsfield measurements and favored to represent a left renal cyst. Stable incidental left adrenal adenoma. Prior cholecystectomy. Stable 10 mm right hepatic dome cyst. Colonic diverticulosis without acute inflammation  04/07/15 - Mammogram. BIRADS-2, benign.   ASSESSMENT / PLAN:   1.  T1N2M0 (clinical stage IIIA) grade 3 invasive ductal carcinoma of the left breast status post lumpectomy, ER and PR positive, HER-2/neu equivocal on FISH but HER-2/CEP 17 ratio was high at 1.97 and this will need to be considered as HER-2 positive tumor   -    Reviewed labs and d/w patient. Overall doing steady, no clinical evidence to suggest recurrent breast cancer. Mammogram in April 2016 was reported BIRADS-2, benign. Serum CA 27-29 level is also within normal range. Plan is to continue on hormonal  therapy with aromatase inhibitor Anastrozole 1 mg by mouth daily. States she visits with Dr.Byrnett after mammogram in 2017, will therefore see her back in one year with labs for continued surveillance.  2.  DEXA scan June 2014 reported osteopenia (the BMD measured at Femur Neck Right is 0.806 g/cm2 with a T-score of -1.7). Will get repeat DEXA scan. She was also advised to continue taking oscal twice daily for osteoporosis prevention.    3.  In between visits, the patient has been advised to call in case of new side effects from anastrazole, new breast masses felt on self exam or other symptoms and will be evaluated sooner. She is agreeable to this plan.    Sandeep Pandit, MD   09/18/2015 11:17 PM      

## 2015-09-20 ENCOUNTER — Telehealth: Payer: Self-pay | Admitting: *Deleted

## 2015-09-21 NOTE — Telephone Encounter (Signed)
She called back and said that she does feel like the medihoney gel is helping. She feels like the opening may even be a little smaller.

## 2015-09-23 ENCOUNTER — Telehealth: Payer: Self-pay | Admitting: *Deleted
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
## 2015-10-17 ENCOUNTER — Ambulatory Visit (INDEPENDENT_AMBULATORY_CARE_PROVIDER_SITE_OTHER): Payer: Medicare Other | Admitting: General Surgery

## 2015-10-17 ENCOUNTER — Encounter: Payer: Self-pay | Admitting: General Surgery

## 2015-10-17 VITALS — BP 130/70 | HR 84 | Resp 14 | Ht 65.0 in | Wt 186.0 lb

## 2015-10-17 DIAGNOSIS — N611 Abscess of the breast and nipple: Secondary | ICD-10-CM

## 2015-10-17 DIAGNOSIS — N641 Fat necrosis of breast: Secondary | ICD-10-CM

## 2015-10-17 NOTE — Patient Instructions (Signed)
Patient to return in two months.  

## 2015-10-17 NOTE — Progress Notes (Signed)
Patient ID: Hailey Ellison, female   DOB: May 29, 1935, 80 y.o.   MRN: [id number]  Chief Complaint  Patient presents with  . Follow-up    left breast abscess    HPI Hailey Ellison is a 80 y.o. female here today for follow up for left breast abscess. She is still applying medi honey once a day. She reports the area has improved still an opening present.  HPI  Past Medical History  Diagnosis Date  . Hypertension   . Murmur   . Radiation 2010    with chemo  . Arthritis   . Lymphedema   . Chronic kidney disease 2014  . Cancer ([place]) 2010    T1c,N2a, M0; ER 90%, PR 60%,  HER-2/neu 2+ IHC, equivocal on fish. 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  Margins clear.  . Breast cancer ([place])     Adjuvant chemotherapy with Taxotere/Carboplatin/Herceptin. Arimidex initiated November 2011  . Squamous cell carcinoma [facility]) June 2016    Left upper inner chest wall, area of previous radiation.    Past Surgical History  Procedure Laterality Date  . Partial hysterectomy  1980's  . Carpal tunnel release Bilateral   . Port-a-cath removal    . Cholecystectomy    . Joint replacement Left 2008  . Breast biopsy Left 2010  . Breast cyst excision    . Breast biopsy Right   . Breast lumpectomy Left 2010    Family History  Problem Relation Age of Onset  . Cancer Sister     breast  . Cancer Sister     kidney    Social History Social History  Substance Use Topics  . Smoking status: Former Research scientist (life sciences)  . Smokeless tobacco: Never Used  . Alcohol Use: No    Allergies  Allergen Reactions  . Benadryl [Diphenhydramine Hcl] Hives  . Tape   . Tegaderm Ag Mesh [Silver]     tegaderm  . Neosporin [Neomycin-Bacitracin Zn-Polymyx] Rash    Current Outpatient Prescriptions  Medication Sig Dispense Refill  . amoxicillin-clavulanate (AUGMENTIN) 875-125 MG per tablet Take 1 tablet by  mouth 2 (two) times daily. 20 tablet 0  . anastrozole (ARIMIDEX) 1 MG tablet Take 1 mg by mouth daily.    . calcium citrate-vitamin D (CITRACAL+D) 315-200 MG-UNIT per tablet Take 1 tablet by mouth 2 (two) times daily.    Marland Kitchen dipyridamole-aspirin (AGGRENOX) 200-25 MG per 12 hr capsule Take 1 capsule by mouth 2 (two) times daily.    Marland Kitchen glucosamine-chondroitin 500-400 MG tablet Take 2 tablets by mouth daily.    . hydrochlorothiazide (HYDRODIURIL) 25 MG tablet Take 25 mg by mouth daily.    . lansoprazole (PREVACID SOLUTAB) 30 MG disintegrating tablet Take 30 mg by mouth daily.    . Multiple Vitamin (MULTIVITAMIN WITH MINERALS) TABS Take 1 tablet by mouth daily.    . valsartan (DIOVAN) 320 MG tablet Take 320 mg by mouth daily.    . verapamil (CALAN) 120 MG tablet Take 60 mg by mouth daily.     No current facility-administered medications for this visit.    Review of Systems Review of Systems  Constitutional: Negative.   Respiratory: Negative.   Cardiovascular: Negative.     Blood pressure 130/70, pulse 84, resp. rate 14, height _0  (1.651 m), weight 186 lb (84.369 kg).  Physical Exam Physical Exam  Constitutional: She is oriented to person, place, and  time. She appears well-developed and well-nourished.  Pulmonary/Chest:    3 mm wide opening about 5 mm deep lefyt breast  Neurological: She is alert and oriented to person, place, and time.  Skin: Skin is warm and dry.       Assessment    Slow improvement in left chest wall wound status post based bar squamous cell cancer excision    Plan    The patient will continue the use of Medihoney daily.    Patient to return in two months.  PCP:  Tera Partridge 10/18/2015, 12:22 PM

## 2015-10-18 ENCOUNTER — Encounter: Payer: Self-pay | Admitting: General Surgery

## 2015-10-19 ENCOUNTER — Other Ambulatory Visit: Payer: Self-pay | Admitting: Internal Medicine

## 2015-10-19 DIAGNOSIS — M25551 Pain in right hip: Secondary | ICD-10-CM

## 2015-10-19 DIAGNOSIS — M25552 Pain in left hip: Principal | ICD-10-CM

## 2015-10-19 DIAGNOSIS — Z9181 History of falling: Secondary | ICD-10-CM

## 2015-10-24 ENCOUNTER — Other Ambulatory Visit: Payer: Self-pay | Admitting: Internal Medicine

## 2015-10-24 DIAGNOSIS — Z9181 History of falling: Secondary | ICD-10-CM

## 2015-10-24 DIAGNOSIS — M25552 Pain in left hip: Principal | ICD-10-CM

## 2015-10-24 DIAGNOSIS — M25551 Pain in right hip: Secondary | ICD-10-CM

## 2015-11-08 ENCOUNTER — Ambulatory Visit
Admission: RE | Admit: 2015-11-08 | Discharge: 2015-11-08 | Disposition: A | Discharge status: Home / Self Care | Payer: Medicare Other | Source: Ambulatory Visit | Attending: Internal Medicine | Admitting: Internal Medicine

## 2015-11-08 DIAGNOSIS — M25552 Pain in left hip: Principal | ICD-10-CM

## 2015-11-08 DIAGNOSIS — M25551 Pain in right hip: Secondary | ICD-10-CM | POA: Diagnosis present

## 2015-11-08 DIAGNOSIS — M1611 Unilateral primary osteoarthritis, right hip: Secondary | ICD-10-CM | POA: Insufficient documentation

## 2015-11-08 DIAGNOSIS — M47896 Other spondylosis, lumbar region: Secondary | ICD-10-CM | POA: Diagnosis not present

## 2015-11-08 DIAGNOSIS — M5136 Other intervertebral disc degeneration, lumbar region: Secondary | ICD-10-CM | POA: Diagnosis not present

## 2015-11-08 DIAGNOSIS — Z9181 History of falling: Secondary | ICD-10-CM

## 2015-11-08 DIAGNOSIS — M25852 Other specified joint disorders, left hip: Secondary | ICD-10-CM | POA: Diagnosis not present

## 2015-11-08 DIAGNOSIS — M21951 Unspecified acquired deformity of right thigh: Secondary | ICD-10-CM | POA: Insufficient documentation

## 2015-12-05 ENCOUNTER — Ambulatory Visit: Payer: Medicare Other | Attending: Internal Medicine

## 2015-12-05 ENCOUNTER — Ambulatory Visit (INDEPENDENT_AMBULATORY_CARE_PROVIDER_SITE_OTHER): Payer: Medicare Other | Admitting: Podiatry

## 2015-12-05 ENCOUNTER — Encounter: Payer: Self-pay | Admitting: Podiatry

## 2015-12-05 DIAGNOSIS — B351 Tinea unguium: Secondary | ICD-10-CM | POA: Diagnosis not present

## 2015-12-05 DIAGNOSIS — R2681 Unsteadiness on feet: Secondary | ICD-10-CM | POA: Insufficient documentation

## 2015-12-05 DIAGNOSIS — M79676 Pain in unspecified toe(s): Secondary | ICD-10-CM | POA: Diagnosis not present

## 2015-12-05 DIAGNOSIS — R29898 Other symptoms and signs involving the musculoskeletal system: Secondary | ICD-10-CM

## 2015-12-05 NOTE — Progress Notes (Signed)
She presents today with chief complaint of painful elongated toenails 1 through 5 bilateral.  Objective: Vital signs are stable she is alert and oriented 3. Pulses are palpable bilateral. Neurologic sensorium is intact.  Assessment: Pain limb secondary to onychomycosis 1 through 5 bilateral.  Plan: Debridement of nails in thickness and length covered service secondary to pain.

## 2015-12-06 NOTE — Therapy (Signed)
[place] [facility]IN [facility] SERVICES [address] [place], [place], [address] Phone: [phone]   Fax:  [phone]  Physical Therapy Evaluation  Patient Details  Name: Hailey Ellison MRN: [id number] Date of Birth: 1935/05/15 No Data Recorded  Encounter Date: 12/05/2015      PT End of Session - 12/06/15 0825    Visit Number 1   Number of Visits 17   Date for PT Re-Evaluation 01/03/16   Authorization Type 1/10   PT Start Time 1635   PT Stop Time 1730   PT Time Calculation (min) 55 min   Equipment Utilized During Treatment Gait belt   Activity Tolerance Patient tolerated treatment well   Behavior During Therapy Hailey Ellison      Past Medical History  Diagnosis Date  . Hypertension   . Murmur   . Radiation 2010    with chemo  . Arthritis   . Lymphedema   . Chronic kidney disease 2014  . Cancer ([place]) 2010    T1c,N2a, M0; ER 90%, PR 60%,  HER-2/neu 2+ IHC, equivocal on fish. 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  Margins clear.  . Breast cancer ([place])     Adjuvant chemotherapy with Taxotere/Carboplatin/Herceptin. Arimidex initiated November 2011  . Squamous cell carcinoma [facility]) June 2016    Left upper inner chest wall, area of previous radiation.    Past Surgical History  Procedure Laterality Date  . Partial hysterectomy  1980's  . Carpal tunnel release Bilateral   . Port-a-cath removal    . Cholecystectomy    . Joint replacement Left 2008  . Breast biopsy Left 2010  . Breast cyst excision    . Breast biopsy Right   . Breast lumpectomy Left 2010    There were no vitals filed for this visit.  Visit Diagnosis:  Unsteadiness on feet - Plan: PT plan of care cert/re-cert  Leg weakness, bilateral - Plan: PT plan of care cert/re-cert      Subjective Assessment - 12/05/15 1649    Subjective pt reports the past 6-9 months she has noticed her balance declining. pt reports she tends to stumble and not be able to catch herself. she also reports having lower back pain which sometimes makes it hard to move. pt also has osteoperosis and would like to reduce her risk of fracture. pt has numbness and/or tingling in her feet. pt denies any change in bowel bladder.    Patient Stated Goals pt reports she would like to reduce fall risk.    Currently in Pain? 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   Five times sit to  stand comments  20.2s   10 Meter Walk 0.78ms   Berg Balance Test   Sit to Stand Able to stand without using hands and stabilize independently   Standing Unsupported Able to stand 2 minutes with supervision   Sitting with Back Unsupported but Feet Supported on Floor or Stool Able to sit safely and securely 2 minutes   Stand to Sit Sits safely with minimal use of hands   Transfers Able to transfer safely, minor use of hands   Standing Unsupported with Eyes Closed Able to stand 10 seconds with supervision   Standing Ubsupported with Feet Together Able to place feet together independently and stand for 1 minute with supervision   From Standing, Reach Forward with Outstretched Arm Can reach forward >5 cm safely (2")   From Standing Position,  Pick up Object from Floor Able to pick up shoe, needs supervision   From Standing Position, Turn to Look Behind Over each Shoulder Turn sideways only but maintains balance   Turn 360 Degrees Needs close supervision or verbal cueing   Standing Unsupported, Alternately Place Feet on Step/Stool Able to complete 4 steps without aid or supervision   Standing Unsupported, One Foot in Front Able to take small step independently and hold 30 seconds   Standing on One Leg Tries to lift leg/unable to hold 3 seconds but remains standing independently   Total Score 38           PROM/AROM: Pt has AROM  WNL    STRENGTH:  Graded on a 0-5 scale Muscle Group Left Right  Shoulder flex    Shoulder Abd    Shoulder Ext    Shoulder IR/ER    Elbow    Wrist/hand     Hip Flex 4/5 4/5  Hip Abd 4-/5 3/5  Hip Add 4-/5 3/5  Hip Ext 4-/5 4-/5  Hip IR/ER    Knee Flex 4+/5 4+/5  Knee Ext 4/5 4/5  Ankle DF 4/5 4/5  Ankle PF 4/5 4/5   SENSATION:  reduced to the feet light touch    FUNCTIONAL MOBILITY: Pt cannot do stairs in step over step pattern, must use railing    BALANCE: Impaired   GAIT: Wide BOS, staggering for short distances at times crosses RLE over the L  OUTCOME MEASURES: TEST Outcome Interpretation  5 times sit<>stand 20.2sec >60 yo, >15 sec indicates increased risk for falls  10 meter walk test             0.58    m/s <1.0 m/s indicates increased risk for falls; limited community ambulator  Timed up and Go                 sec <14 sec indicates increased risk for falls  6 minute walk test                Feet 1000 feet is community aWater quality scientist38/56 <36/56 (100% risk for falls), 37-45 (80% risk for falls); 46-51 (>50% risk for falls); 52-55 (lower risk <25% of falls)    Therex: Bridges 2x10 Sit to stand with no UE x 10 Pt education and cane fit. Pt instructed to use cane at all times to reduce fall risk                     PT Education -  12/06/15 0824    Education provided Yes   Education Details plan of care, bridges, sit to stand x  10 at each meal   Person(s) Educated Patient   Comprehension Verbalized understanding             PT Long Term Goals - 12-09-2015 1610    PT LONG TERM GOAL #1   Title pt will perform 5x sit to stand in <15s to demonstrate improved LE strength   Time 8   Period Weeks   Status New   PT LONG TERM GOAL #2   Title pt will improve gait speed to 0.68ms to normalized home mobility   Time 4   Period Weeks   Status New   PT LONG TERM GOAL #3   Title pt will score >46/56 on berg balance test demonstrating reduced fall risk    Baseline 38/56   Time 8   Period Weeks   Status New               Plan - 1Dec 16, [id number]    Clinical Impression Statement pt presents with impaired strength RLE>LLE, impaired balance, gait disturbance and history of falls and osteoporosis. pt scored as a high fall risk on berg balance test. pt would benefit from skilled PT services to address these impairments to maximize functional mobility and reduce falls risk.    Pt will benefit from skilled therapeutic intervention in order to improve on the following deficits Decreased endurance;Decreased balance;Decreased strength;Difficulty walking;Pain;Decreased mobility   Rehab Potential Good   PT Frequency 2x / week   PT Duration 8 weeks   PT Treatment/Interventions Aquatic Therapy;Neuromuscular re-education;Balance training;Therapeutic exercise;Therapeutic activities;Functional mobility training;Gait training;Stair training;Manual techniques          G-Codes - [id number]    Functional Assessment Tool Used 5xsit to stand, 135mlk, berg   Functional Limitation Mobility: Walking and moving around   Mobility: Walking and Moving Around Current Status ([id number] least 40 percent but less than 60 percent impaired, limited or restricted   Mobility: Walking and Moving Around Goal Status ([phone] least 1 percent but  less than 20 percent impaired, limited or restricted       Problem List Patient Active Problem List   Diagnosis Date Noted  . Fat necrosis (segmental) of breast 07/26/2015  . Squamous cell carcinoma in situ 05/25/2015  . Skin nodule 05/14/2015  . Lymphedema 08/05/2014  . Lump or mass in breast 07/08/2014  . Personal history of malignant neoplasm of breast 05/25/2014  . Breast [place], [place] 2 ([place]/16/[place]   AsGorden HarmsTortorici, PT, DPT #[id number] Hailey Ellison 12[birth year] 16, 20168:32 AM  [facility] [address]hone: [phone] Fax:  [phone]: Hailey DYMEKRN: [id number] of Birth: [birth year]

## 2015-12-07 ENCOUNTER — Ambulatory Visit (INDEPENDENT_AMBULATORY_CARE_PROVIDER_SITE_OTHER): Payer: Medicare Other | Admitting: General Surgery

## 2015-12-07 ENCOUNTER — Encounter: Payer: Self-pay | Admitting: General Surgery

## 2015-12-07 VITALS — BP 110/58 | HR 70 | Resp 14 | Ht 65.0 in | Wt 183.0 lb

## 2015-12-07 DIAGNOSIS — N611 Abscess of the breast and nipple: Secondary | ICD-10-CM | POA: Diagnosis not present

## 2015-12-07 DIAGNOSIS — N641 Fat necrosis of breast: Secondary | ICD-10-CM | POA: Diagnosis not present

## 2015-12-07 NOTE — Progress Notes (Signed)
Patient ID: Hailey Ellison, female   DOB: 01/18/1935, 80 y.o.   MRN: [id number]  Chief Complaint  Patient presents with  . Follow-up    HPI Hailey Ellison is a 80 y.o. female.  Here for follow up for follow up for left breast abscess. She was applying medi honey once a day until last week she stopped because she wasn't sure if she needed to continue. She reports the area has improved still an opening present. Occasional drainage. No pain. No odor. She states she has had some recent falls, "off balance", she is going to PT. I personally reviewed the patient's history. HPI  Past Medical History  Diagnosis Date  . Hypertension   . Murmur   . Radiation 2010    with chemo  . Arthritis   . Lymphedema   . Chronic kidney disease 2014  . Cancer ([place]) 2010    T1c,N2a, M0; ER 90%, PR 60%,  HER-2/neu 2+ IHC, equivocal on fish. 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  Margins clear.  . Breast cancer ([place])     Adjuvant chemotherapy with Taxotere/Carboplatin/Herceptin. Arimidex initiated November 2011  . Squamous cell carcinoma [facility]) June 2016    Left upper inner chest wall, area of previous radiation.    Past Surgical History  Procedure Laterality Date  . Partial hysterectomy  1980's  . Carpal tunnel release Bilateral   . Port-a-cath removal    . Cholecystectomy    . Joint replacement Left 2008  . Breast biopsy Left 2010  . Breast cyst excision    . Breast biopsy Right   . Breast lumpectomy Left 2010    Family History  Problem Relation Age of Onset  . Cancer Sister     breast  . Cancer Sister     kidney    Social History Social History  Substance Use Topics  . Smoking status: Former Research scientist (life sciences)  . Smokeless tobacco: Never Used  . Alcohol Use: No    Allergies  Allergen Reactions  . Benadryl [Diphenhydramine Hcl] Hives  . Tape   . Tegaderm Ag Mesh  [Silver]     tegaderm  . Neosporin [Neomycin-Bacitracin Zn-Polymyx] Rash    Current Outpatient Prescriptions  Medication Sig Dispense Refill  . anastrozole (ARIMIDEX) 1 MG tablet Take 1 mg by mouth daily.    . calcium citrate-vitamin D (CITRACAL+D) 315-200 MG-UNIT per tablet Take 1 tablet by mouth 2 (two) times daily.    Marland Kitchen dipyridamole-aspirin (AGGRENOX) 200-25 MG per 12 hr capsule Take 1 capsule by mouth 2 (two) times daily.    Marland Kitchen glucosamine-chondroitin 500-400 MG tablet Take 2 tablets by mouth daily.    . hydrochlorothiazide (HYDRODIURIL) 25 MG tablet Take 25 mg by mouth daily.    . lansoprazole (PREVACID SOLUTAB) 30 MG disintegrating tablet Take 30 mg by mouth daily.    . Multiple Vitamin (MULTIVITAMIN WITH MINERALS) TABS Take 1 tablet by mouth daily.    . valsartan (DIOVAN) 320 MG tablet Take 320 mg by mouth daily.    . verapamil (CALAN) 120 MG tablet Take 60 mg by mouth daily.     No current facility-administered medications for this visit.    Review of Systems Review of Systems  Constitutional: Negative.   Respiratory: Negative.   Cardiovascular: Negative.     Blood pressure 110/58, pulse 70, resp. rate 14, height _0  (1.651 m), weight 183 lb (  83.008 kg).  Physical Exam Physical Exam  Constitutional: She is oriented to person, place, and time. She appears well-developed and well-nourished.  Pulmonary/Chest:    2 mm opening in left breast scar.  Neurological: She is alert and oriented to person, place, and time.  Skin: Skin is warm and dry.  Psychiatric: Her behavior is normal.    Data Reviewed Pathology. Negative margins on reexcision.  Assessment    Residual epithelialization of abscess cavity and area of fat necrosis.    Plan    Observation alone is warranted at this time.    May discontinue medi honey, keep area clean.  Follow up in May 2017 as scheduled.  PCP:  [name] 12/09/2015, 7:13 AM

## 2015-12-07 NOTE — Patient Instructions (Signed)
May discontinue medi honey, keep area clean. Follow up in May 2017 as scheduled

## 2015-12-21 ENCOUNTER — Ambulatory Visit: Payer: Medicare Other

## 2015-12-28 ENCOUNTER — Ambulatory Visit: Payer: Medicare Other | Attending: Internal Medicine

## 2015-12-28 DIAGNOSIS — R29898 Other symptoms and signs involving the musculoskeletal system: Secondary | ICD-10-CM | POA: Diagnosis present

## 2015-12-28 DIAGNOSIS — R2681 Unsteadiness on feet: Secondary | ICD-10-CM | POA: Diagnosis present

## 2015-12-29 NOTE — Patient Instructions (Signed)
[email or web address] Standing mini squat 2x10; standing ankle DF/PF 2x10; standing hip abd 2x10; standing march 2x10; standing hip extension 2x10;  

## 2015-12-29 NOTE — Therapy (Signed)
[place] MAIN [facility] SERVICES [address] [place], [place], [address] Phone: [phone]   Fax:  [phone]  Physical Therapy Treatment  Patient Details  Name: Hailey Ellison MRN: [id number] Date of Birth: August 23, 1935 No Data Recorded  Encounter Date: 12/28/2015      PT End of Session - 12/28/15 1700    Visit Number 2   Number of Visits 17   Date for PT Re-Evaluation 01/03/16   Authorization Type 2/10   Equipment Utilized During Treatment Gait belt   Activity Tolerance Patient tolerated treatment well   Behavior During Therapy [facility] for tasks assessed/performed      Past Medical History  Diagnosis Date  . Hypertension   . Murmur   . Radiation 2010    with chemo  . Arthritis   . Lymphedema   . Chronic kidney disease 2014  . Cancer ([place]) 2010    T1c,N2a, M0; ER 90%, PR 60%,  HER-2/neu 2+ IHC, equivocal on fish. 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  Margins clear.  . Breast cancer ([place])     Adjuvant chemotherapy with Taxotere/Carboplatin/Herceptin. Arimidex initiated November 2011  . Squamous cell carcinoma [facility]) June 2016    Left upper inner chest wall, area of previous radiation.    Past Surgical History  Procedure Laterality Date  . Partial hysterectomy  1980's  . Carpal tunnel release Bilateral   . Port-a-cath removal    . Cholecystectomy    . Joint replacement Left 2008  . Breast biopsy Left 2010  . Breast cyst excision    . Breast biopsy Right   . Breast lumpectomy Left 2010    There were no vitals filed for this visit.  Visit Diagnosis:  Unsteadiness on feet  Leg weakness, bilateral      Subjective Assessment - 12/28/15 1659    Subjective pt reports she had multiple deaths in the family so she hasnt attended therapy. she hasnt had any more falls.    Currently in Pain? No/denies       Therex: Nustep LE only L 4 x 2 min no charge Standing mini squat 2x10; standing ankle DF/PF 2x10; standing hip abd 2x10; standing march 2x10; standing hip extension 2x10;  Pt requires min verbal and tactile cues for proper exercise performance   NMR: standing on AIREX wide BOS 30s x 4 Standing on AIREX with weight shifting L/R and AP 2x1 min each Tandem balance on ground with CGA to min A needed 30s x 3 each foot Standing with wide BOS EO/EC 10s x 5  pt requires CGA for safety on balance exercises                            PT Education - 12/28/15 1700    Education provided Yes   Education Details new HEP   Person(s) Educated Patient   Methods Explanation   Comprehension Verbalized understanding             PT Long Term Goals - 12/06/15 0828    PT LONG TERM GOAL #1   Title pt will perform 5x sit to stand in <15s to demonstrate improved LE strength   Time 8   Period Weeks   Status New   PT LONG TERM GOAL #2   Title pt will improve gait speed to 0.33ms to  normalized home mobility   Time 4   Period Weeks   Status New   PT LONG TERM GOAL #3   Title pt will score >46/56 on berg balance test demonstrating reduced fall risk    Baseline 38/56   Time 8   Period Weeks   Status New               Plan - 12/29/15 5539    Clinical Impression Statement PT progressed HEP today for general strengthening at the counter top. Pt has good self motivation in therapy session. she does need short rest breaks due to fatigue. Pt also is a little impulsive and high strung and easily excitable during session needing cues at times to slow down her thought process and focus on the task at hand. pt demonstrates difficulty with both static and dynamic balance.    Pt will benefit from skilled therapeutic intervention in order to improve on the following deficits Decreased endurance;Decreased balance;Decreased strength;Difficulty walking;Pain;Decreased mobility   Rehab  Potential Good   PT Frequency 2x / week   PT Duration 8 weeks   PT Treatment/Interventions Aquatic Therapy;Neuromuscular re-education;Balance training;Therapeutic exercise;Therapeutic activities;Functional mobility training;Gait training;Stair training;Manual techniques   PT Next Visit Plan progress strengthening and balance as tolerated        Problem List Patient Active Problem List   Diagnosis Date Noted  . Fat necrosis (segmental) of breast 07/26/2015  . Squamous cell carcinoma in situ 05/25/2015  . Skin nodule 05/14/2015  . Lymphedema 08/05/2014  . Lump or mass in breast 07/08/2014  . Personal history of malignant neoplasm of breast 05/25/2014  . Breast cancer, stage 2 ([place]) 04/08/2013   Gorden Harms. Miliana Gangwer, PT, DPT [phone]  Chaos Carlile 12/29/2015, 7:45 AM  [place] MAIN [facility] SERVICES [address] [place], [place], [address] Phone: [phone]   Fax:  [phone]  Name: SASHA ROGEL MRN: [id number] Date of Birth: April 17, 1935

## 2016-01-02 ENCOUNTER — Ambulatory Visit: Payer: Medicare Other

## 2016-01-09 ENCOUNTER — Ambulatory Visit: Payer: Medicare Other

## 2016-01-09 DIAGNOSIS — R2681 Unsteadiness on feet: Secondary | ICD-10-CM

## 2016-01-09 DIAGNOSIS — R29898 Other symptoms and signs involving the musculoskeletal system: Secondary | ICD-10-CM

## 2016-01-10 NOTE — Therapy (Signed)
[place] MAIN [facility] SERVICES [address] [place], [place], [address] Phone: [phone]   Fax:  [phone]  Physical Therapy Treatment  Patient Details  Name: Hailey Ellison MRN: [id number] Date of Birth: Jun 21, 1935 No Data Recorded  Encounter Date: 01/09/2016      PT End of Session - 01/09/16 1848    Visit Number 3   Number of Visits 17   Date for PT Re-Evaluation 02/06/16   Authorization Type 1/10   PT Start Time 7048   PT Stop Time 1600   PT Time Calculation (min) 44 min   Equipment Utilized During Treatment Gait belt   Activity Tolerance Patient tolerated treatment well   Behavior During Therapy [facility] for tasks assessed/performed      Past Medical History  Diagnosis Date  . Hypertension   . Murmur   . Radiation 2010    with chemo  . Arthritis   . Lymphedema   . Chronic kidney disease 2014  . Cancer ([place]) 2010    T1c,N2a, M0; ER 90%, PR 60%,  HER-2/neu 2+ IHC, equivocal on fish. 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  Margins clear.  . Breast cancer ([place])     Adjuvant chemotherapy with Taxotere/Carboplatin/Herceptin. Arimidex initiated November 2011  . Squamous cell carcinoma [facility]) June 2016    Left upper inner chest wall, area of previous radiation.    Past Surgical History  Procedure Laterality Date  . Partial hysterectomy  1980's  . Carpal tunnel release Bilateral   . Port-a-cath removal    . Cholecystectomy    . Joint replacement Left 2008  . Breast biopsy Left 2010  . Breast cyst excision    . Breast biopsy Right   . Breast lumpectomy Left 2010    There were no vitals filed for this visit.  Visit Diagnosis:  Unsteadiness on feet  Leg weakness, bilateral      Subjective Assessment - 01/09/16 1519    Subjective Patient reports no new falls, no falls since starting PT. She reports  she forgot her cane today for this session. She has not had much time to complete her HEP.    Patient Stated Goals pt reports she would like to reduce fall risk.    Currently in Pain? No/denies      5x sit to stand 23.6 seconds (trial run) - 14.91 seconds (no use of arms)   Gait training with SPC, 4WW. Per visual assessment patient is much more appropriate for 4WW for community distances at this time as she is quite unstable in ambulation and nearly falls several times in this session.   Tandem stance in // bars able to complete 5-10" holds with use of UEs on // bars to achieve position multiple bouts completed bilaterally.   Blue foam pad in // bars in Rhomberg stance able to complete 5-10" holds with eyes open for 3 bouts of 1 minute attempts.                            PT Education - 01/09/16 1847    Education provided Yes   Education Details Educated patient extensively on need to use AD's given her high falls risk.    Person(s) Educated Patient   Methods Explanation;Demonstration   Comprehension Verbalized understanding;Returned demonstration  PT Long Term Goals - 01/09/16 1526    PT LONG TERM GOAL #1   Title pt will perform 5x sit to stand in <15s to demonstrate improved LE strength   Baseline 14.9 seconds, off balance during transfer consistently.    Time 8   Period Weeks   Status Partially Met   PT LONG TERM GOAL #2   Title pt will improve gait speed to 0.26ms to normalized home mobility   Baseline 121mn 12.57 second    Time 4   Period Weeks   Status Partially Met   PT LONG TERM GOAL #3   Title pt will score >46/56 on berg balance test demonstrating reduced fall risk    Baseline 38/56 at baseline, 39/56 on 01/09/2016   Time 8   Period Weeks   Status On-going               Plan - 01/09/16 1849    Clinical Impression Statement Patient presents with ataxia which appears to be a reuslt of suspected neuropathy in bilateral  LEs. She is very impulsive and unaware of her high falls risk. In this session alone she loses her balance and requires PT to use gait belt at least 5 times, and was educated extensively on use of assistive device "all the time". Patient escorted to waiting room and offered volunteer and wheelchair services, which she declined. Patient at this time is not safe to ambulate without use of assistive device.    Pt will benefit from skilled therapeutic intervention in order to improve on the following deficits Decreased endurance;Decreased balance;Decreased strength;Difficulty walking;Pain;Decreased mobility   Rehab Potential Good   PT Frequency 2x / week   PT Duration 8 weeks   PT Treatment/Interventions Aquatic Therapy;Neuromuscular re-education;Balance training;Therapeutic exercise;Therapeutic activities;Functional mobility training;Gait training;Stair training;Manual techniques   PT Next Visit Plan progress strengthening and balance as tolerated   Consulted and Agree with Plan of Care Patient          G-Codes - 01February 02, 2017907    Functional Assessment Tool Used 5xsit to stand, 1084mk, berg   Functional Limitation Mobility: Walking and moving around   Mobility: Walking and Moving Around Current Status ([phone] least 40 percent but less than 60 percent impaired, limited or restricted   Mobility: Walking and Moving Around Goal Status ([id number] least 1 percent but less than 20 percent impaired, limited or restricted      Problem List Patient Active Problem List   Diagnosis Date Noted  . Fat necrosis (segmental) of breast 07/26/2015  . Squamous cell carcinoma in situ 05/25/2015  . Skin nodule 05/14/2015  . Lymphedema 08/05/2014  . Lump or mass in breast 07/08/2014  . Personal history of malignant neoplasm of breast 05/25/2014  . Breast cancer, stage 2 ([place]   PatKerman PasseyT, DPT    1/102-01-2016:14 AM  ConArnoldIN REHMontefi[facility] Medical CenterERVICES [address] [place]one: [phone]:  [phone]: SylKEISI ECKFORDN: [id number] of Birth: 10/01-10-1935

## 2016-01-11 ENCOUNTER — Ambulatory Visit: Payer: Medicare Other

## 2016-01-11 DIAGNOSIS — R2681 Unsteadiness on feet: Secondary | ICD-10-CM | POA: Diagnosis not present

## 2016-01-11 DIAGNOSIS — R29898 Other symptoms and signs involving the musculoskeletal system: Secondary | ICD-10-CM

## 2016-01-11 NOTE — Therapy (Signed)
[place] MAIN [facility] SERVICES [address] [place], [place], [address] Phone: [phone]   Fax:  [phone]  Physical Therapy Treatment  Patient Details  Name: QUINTASIA THEROUX MRN: [id number] Date of Birth: [birth year] No Data Recorded  Encounter Date: 01/11/2016      PT End of Session - 01/11/16 1718    Visit Number 4   Number of Visits 17   Date for PT Re-Evaluation 02/06/16   Authorization Type 4/10   PT Start Time 1515   PT Stop Time 1605   PT Time Calculation (min) 50 min   Equipment Utilized During Treatment Gait belt   Activity Tolerance Patient tolerated treatment well   Behavior During Therapy WFL for tasks assessed/performed      Past Medical History  Diagnosis Date  . Hypertension   . Murmur   . Radiation 2010    with chemo  . Arthritis   . Lymphedema   . Chronic kidney disease 2014  . Cancer ([place]) 2010    T1c,N2a, M0; ER 90%, PR 60%,  HER-2/neu 2+ IHC, equivocal on fish. 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  Margins clear.  . Breast cancer ([place])     Adjuvant chemotherapy with Taxotere/Carboplatin/Herceptin. Arimidex initiated November 2011  . Squamous cell carcinoma [facility]) June 2016    Left upper inner chest wall, area of previous radiation.    Past Surgical History  Procedure Laterality Date  . Partial hysterectomy  1980's  . Carpal tunnel release Bilateral   . Port-a-cath removal    . Cholecystectomy    . Joint replacement Left 2008  . Breast biopsy Left 2010  . Breast cyst excision    . Breast biopsy Right   . Breast lumpectomy Left 2010    There were no vitals filed for this visit.  Visit Diagnosis:  Unsteadiness on feet  Leg weakness, bilateral      Subjective Assessment - 01/11/16 1656    Subjective Patient reports that she lost her cane and has not used one since her  last visit. pt says she will go purchase another in the next few days. pt reports doing more of her HEP program.   Patient Stated Goals pt reports she would like to reduce fall risk.    Currently in Pain? No/denies   Pain Score 0-No pain          Gait training:  Ambulation with correct gait use 100 ft x 5 laps pt required mod verbal and tactile cues in cane sequencing and foot placement and upward gaze with SBA  Neuromuscular re ed:  Staggered stance on airex with closed eyes no UE 30 sec x 3 sets Staggered stand on airex with UE D2 pattern with ball watch no UE 10 reps x 2 R to L and L to R Ambulation with head turn to L and R 20 ft x 6 laps  Pt requires min to mod cues with upward gaze and UE assistance with CGA  Therapeutic exercise:  Standing marching with 3 sec hold in // bars one finger UE assistance 10 reps x 3 sets pt required min to mod cues in UE assistance with CGA Leg press 100 lbs 10 reps 3 sets           PT Education - 01/11/16 1717    Education provided Yes   Education Details pt  was educated in correct use of cane in ambulation   Person(s) Educated Patient   Methods Explanation;Demonstration;Tactile cues;Verbal cues   Comprehension Verbalized understanding;Returned demonstration             PT Long Term Goals - 01/09/16 1526    PT LONG TERM GOAL #1   Title pt will perform 5x sit to stand in <15s to demonstrate improved LE strength   Baseline 14.9 seconds, off balance during transfer consistently.    Time 8   Period Weeks   Status Partially Met   PT LONG TERM GOAL #2   Title pt will improve gait speed to 0.49ms to normalized home mobility   Baseline 166mn 12.57 second    Time 4   Period Weeks   Status Partially Met   PT LONG TERM GOAL #3   Title pt will score >46/56 on berg balance test demonstrating reduced fall risk    Baseline 38/56 at baseline, 39/56 on 01/09/2016   Time 8   Period Weeks   Status On-going                Plan - 01/11/16 1719    Clinical Impression Statement pt demonstrates improved ability to use correctly, but still will need further instruction likely for sequencing and safety. pt has demonstrated improvement in static balance activities, based on her ability to stand longer with narrow base of support and being able to tolerate progression of balance activities.    Pt will benefit from skilled therapeutic intervention in order to improve on the following deficits Decreased endurance;Decreased balance;Decreased strength;Difficulty walking;Pain;Decreased mobility   Rehab Potential Good   PT Frequency 2x / week   PT Duration 8 weeks   PT Treatment/Interventions Aquatic Therapy;Neuromuscular re-education;Balance training;Therapeutic exercise;Therapeutic activities;Functional mobility training;Gait training;Stair training;Manual techniques   PT Next Visit Plan progress strengthening and balance as tolerated   Consulted and Agree with Plan of Care Patient        Problem List Patient Active Problem List   Diagnosis Date Noted  . Fat necrosis (segmental) of breast 07/26/2015  . Squamous cell carcinoma in situ 05/25/2015  . Skin nodule 05/14/2015  . Lymphedema 08/05/2014  . Lump or mass in breast 07/08/2014  . Personal history of malignant neoplasm of breast 05/25/2014  . Breast cancer, stage 2 ([place]/16/2014   CoDomingo PulseSPT This entire session was performed under direct supervision and direction of a licensed therapist/therapist assistant . I have personally read, edited and approve of the note as written.   AsGorden HarmsTortorici, PT, DPT #[id number],Ashley 01/11/2016, 5:34 PM  CoRaritanAIN RETilden Community HospitalERVICES [address] Deerfield RoaddHewlett NeckNCAl[place]hone: [phone] Fax:  [phone]: SyCALE DECAROLISRN: [id number] of Birth: 10July 17, 1936

## 2016-01-16 ENCOUNTER — Ambulatory Visit: Payer: Medicare Other

## 2016-01-16 DIAGNOSIS — R29898 Other symptoms and signs involving the musculoskeletal system: Secondary | ICD-10-CM

## 2016-01-16 DIAGNOSIS — R2681 Unsteadiness on feet: Secondary | ICD-10-CM

## 2016-01-16 NOTE — Therapy (Signed)
[place] MAIN [facility] SERVICES [address] [place], [place], [address] Phone: [phone]   Fax:  [phone]  Physical Therapy Treatment  Patient Details  Name: NIMO VERASTEGUI MRN: [id number] Date of Birth: Apr 29, 1935 No Data Recorded  Encounter Date: 01/16/2016      PT End of Session - 01/16/16 1631    Visit Number 5   Number of Visits 17   Date for PT Re-Evaluation 02/06/16   Authorization Type 5/10   PT Start Time 1519   PT Stop Time 1602   PT Time Calculation (min) 43 min   Equipment Utilized During Treatment Gait belt   Activity Tolerance Patient tolerated treatment well   Behavior During Therapy WFL for tasks assessed/performed      Past Medical History  Diagnosis Date  . Hypertension   . Murmur   . Radiation 2010    with chemo  . Arthritis   . Lymphedema   . Chronic kidney disease 2014  . Cancer ([place]) 2010    T1c,N2a, M0; ER 90%, PR 60%,  HER-2/neu 2+ IHC, equivocal on fish. 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  Margins clear.  . Breast cancer ([place])     Adjuvant chemotherapy with Taxotere/Carboplatin/Herceptin. Arimidex initiated November 2011  . Squamous cell carcinoma [facility]) June 2016    Left upper inner chest wall, area of previous radiation.    Past Surgical History  Procedure Laterality Date  . Partial hysterectomy  1980's  . Carpal tunnel release Bilateral   . Port-a-cath removal    . Cholecystectomy    . Joint replacement Left 2008  . Breast biopsy Left 2010  . Breast cyst excision    . Breast biopsy Right   . Breast lumpectomy Left 2010    There were no vitals filed for this visit.  Visit Diagnosis:  Unsteadiness on feet  Leg weakness, bilateral      Subjective Assessment - 01/16/16 1606    Subjective Patient reports feeling more comfortable with cane use. pt states that  she feels like she is leaning to the right but cane feels the right height.   Patient Stated Goals pt reports she would like to reduce fall risk.    Currently in Pain? No/denies   Pain Score 0-No pain         Neuromuscular re ed:  Ambulation with cane with head turns 75 ft x 5 laps pt required CGA  Marches on airex with 2 sec hold in // bars with 2 finger assistance 10 reps x 3 sets pt required min cues and min assistance for balance and hold 8 pointed star toe touch R and L LE x 3 sets pt required CGA with min cues for light touch and control Lateral step from airex over half roll with head turn R and L LE 10 reps x 2 sets with 1 UE support pt required mod cues for foot placement and head turn and CGA for safety         PT Long Term Goals - 01/09/16 1526    PT LONG TERM GOAL #1   Title pt will perform 5x sit to stand in <15s to demonstrate improved LE strength   Baseline 14.9 seconds, off balance during transfer consistently.    Time 8   Period Weeks   Status Partially Met   PT LONG TERM GOAL #2  Title pt will improve gait speed to 0.62ms to normalized home mobility   Baseline 154mn 12.57 second    Time 4   Period Weeks   Status Partially Met   PT LONG TERM GOAL #3   Title pt will score >46/56 on berg balance test demonstrating reduced fall risk    Baseline 38/56 at baseline, 39/56 on 01/09/2016   Time 8   Period Weeks   Status On-going               Plan - 01/16/16 1633    Clinical Impression Statement pt demonstrates good carry over from previous session gait training regarding ambulation with cane based on proper sequencing. pt continues to have trouble in static and dynamic balance activities which would benefit from further PT services.   Pt will benefit from skilled therapeutic intervention in order to improve on the following deficits Decreased endurance;Decreased balance;Decreased strength;Difficulty walking;Pain;Decreased mobility   Rehab Potential Good    PT Frequency 2x / week   PT Duration 8 weeks   PT Treatment/Interventions Aquatic Therapy;Neuromuscular re-education;Balance training;Therapeutic exercise;Therapeutic activities;Functional mobility training;Gait training;Stair training;Manual techniques   PT Next Visit Plan progress strengthening and balance as tolerated   Consulted and Agree with Plan of Care Patient        Problem List Patient Active Problem List   Diagnosis Date Noted  . Fat necrosis (segmental) of breast 07/26/2015  . Squamous cell carcinoma in situ 05/25/2015  . Skin nodule 05/14/2015  . Lymphedema 08/05/2014  . Lump or mass in breast 07/08/2014  . Personal history of malignant neoplasm of breast 05/25/2014  . [place]   CoDomingo PulseSPT   Coty ThMarcello Moores/23/2017, 4:40 PM This entire session was performed under direct supervision and direction of a licensed therapist/therapist assistant . I have personally read, edited and approve of the note as written. AsGorden HarmsTortorici, PT, DPT #[id number] REUintah Basin Medical CenterERVICES [address]dPanaNC[place]hone: [phone] Fax:  [phone]: SyJAMEYAH FENNEWALDRN: [id number] of Birth: [birth year]

## 2016-01-18 ENCOUNTER — Ambulatory Visit: Payer: Medicare Other

## 2016-01-18 DIAGNOSIS — R2681 Unsteadiness on feet: Secondary | ICD-10-CM | POA: Diagnosis not present

## 2016-01-18 DIAGNOSIS — R29898 Other symptoms and signs involving the musculoskeletal system: Secondary | ICD-10-CM

## 2016-01-18 NOTE — Therapy (Signed)
[place] MAIN [facility] SERVICES [address] [place], [place], [address] Phone: [phone]   Fax:  [phone]  Physical Therapy Treatment  Patient Details  Name: Hailey Ellison MRN: [id number] Date of Birth: 06/19/1935 No Data Recorded  Encounter Date: 01/18/2016      PT End of Session - 01/18/16 1728    Visit Number 6   Number of Visits 25   Date for PT Re-Evaluation 02/06/16   Authorization Type 1/10   PT Start Time 1515   PT Stop Time 1604   PT Time Calculation (min) 49 min   Equipment Utilized During Treatment Gait belt   Activity Tolerance Patient tolerated treatment well   Behavior During Therapy WFL for tasks assessed/performed      Past Medical History  Diagnosis Date  . Hypertension   . Murmur   . Radiation 2010    with chemo  . Arthritis   . Lymphedema   . Chronic kidney disease 2014  . Cancer ([place]) 2010    T1c,N2a, M0; ER 90%, PR 60%,  HER-2/neu 2+ IHC, equivocal on fish. 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  Margins clear.  . Breast cancer ([place])     Adjuvant chemotherapy with Taxotere/Carboplatin/Herceptin. Arimidex initiated November 2011  . Squamous cell carcinoma [facility]) June 2016    Left upper inner chest wall, area of previous radiation.    Past Surgical History  Procedure Laterality Date  . Partial hysterectomy  1980's  . Carpal tunnel release Bilateral   . Port-a-cath removal    . Cholecystectomy    . Joint replacement Left 2008  . Breast biopsy Left 2010  . Breast cyst excision    . Breast biopsy Right   . Breast lumpectomy Left 2010    There were no vitals filed for this visit.  Visit Diagnosis:  Unsteadiness on feet - Plan: PT plan of care cert/re-cert  Leg weakness, bilateral - Plan: PT plan of care cert/re-cert      Subjective Assessment - 01/18/16 1708     Subjective Patient reports feeling more confident in balance activities. pt reports she has trouble getting going after sitting for extended period of time and stopping after moving.   Patient Stated Goals pt reports she would like to reduce fall risk.    Currently in Pain? No/denies   Pain Score 0-No pain      Therapeutic exercise:  SPT re-assessed outcome measures and progress toward goals as follows 5x sit to stand: 17.03 seconds 10 MWT with cane: 0.91 m/s 10 MWT without cane: 0.88 m/s Berg: 49/56 Tandem on AIREX 30s x 4          PT Education - 01/18/16 1713    Education provided Yes   Education Details pt was educated in plan of care and goals   Person(s) Educated Patient   Methods Explanation   Comprehension Verbalized understanding             PT Long Term Goals - 01/18/16 1608    PT LONG TERM GOAL #1   Title pt will perform 5x sit to stand in <15s to demonstrate improved LE strength   Baseline 14.9 seconds, off balance during transfer consistently.    Time 8   Period Weeks   Status Partially Met   PT LONG TERM GOAL #2   Title pt will improve gait speed  to 0.35ms to normalized home mobility   Baseline 165mn 12.57 second    Time 4   Period Weeks   Status Achieved   PT LONG TERM GOAL #3   Title pt will score >46/56 on berg balance test demonstrating reduced fall risk    Baseline 38/56 at baseline, 39/56 on 01/09/2016   Time 8   Period Weeks   Status Achieved   PT LONG TERM GOAL #4   Title pt will perform 5x sit to stand in <15s to demonstrate improved LE strength   Baseline 17.03 seconds   Time 4   Period Weeks   Status Partially Met   PT LONG TERM GOAL #5   Title pt will improve gait speed to 1.55m155mto normalized community mobility   Baseline 0.13m49mithout AD   Time 4   Period Weeks   Status New   Additional Long Term Goals   Additional Long Term Goals Yes   PT LONG TERM GOAL #6   Title pt will score >53/56 on berg balance test demonstrating  reduced fall risk    Baseline 49/56   Time 4   Period Weeks   Status New   PT LONG TERM GOAL #7   Title pt will be able to ambulate up and down 4 stairs without UE support to get into her daughters house   Time 4   Period Weeks   Status New               Plan - 01/205-Feb-20170    Clinical Impression Statement progress note was performed as per protocal. pt has progressed in strength and balance activities as demonstrated in outcome measures performed. pt is in the moderate fall risk on the berg balance score and displays potential to improve balance and strength gains in order to move in low fall risk. pt demonstrates desire to continue therapy. pt has made significant progress regarding strength, balance, and gait and has partially met goals at this time.    Pt will benefit from skilled therapeutic intervention in order to improve on the following deficits Decreased endurance;Decreased balance;Decreased strength;Difficulty walking;Pain;Decreased mobility   Rehab Potential Good   PT Frequency 2x / week   PT Duration 4 weeks   PT Treatment/Interventions Aquatic Therapy;Neuromuscular re-education;Balance training;Therapeutic exercise;Therapeutic activities;Functional mobility training;Gait training;Stair training;Manual techniques   PT Next Visit Plan progress strengthening and balance as tolerated   Consulted and Agree with Plan of Care Patient                                          Consulted and Agree with Plan of Care Patient          G-Codes - 01/2February 05, 20173    Functional Assessment Tool Used 5xsit to stand, 10mw3m berg   Functional Limitation Mobility: Walking and moving around   Mobility: Walking and Moving Around Current Status ([id number] 20 percent but less than 40 percent impaired, limited or restricted   Mobility: Walking and Moving Around Goal Status ([id number] 1 percent but less than 20 percent impaired, limited or restricted   Mobility:  Walking and Moving Around Discharge Status ([phone]     Problem List Patient Active Problem List   Diagnosis Date Noted  . Fat necrosis (segmental) of breast 07/26/2015  . Squamous cell carcinoma in situ 05/25/2015  . Skin nodule  05/14/2015  . Lymphedema 08/05/2014  . Lump or mass in breast 07/08/2014  . Personal history of malignant neoplasm of breast 05/25/2014  . Breast cancer, stage 2 ([place]) 04/08/2013   Domingo Pulse, SPT This entire session was performed under direct supervision and direction of a licensed therapist/therapist assistant . I have personally read, edited and approve of the note as written. Gorden Harms. Tortorici, PT, DPT [phone]  Tortorici,Ashley 01/19/2016, 9:21 AM  [place] MAIN [facility] SERVICES [address] [place], [place], [address] Phone: [phone]   Fax:  [phone]  Name: Hailey Ellison MRN: [id number] Date of Birth: 1935/08/05

## 2016-01-24 ENCOUNTER — Ambulatory Visit: Payer: Medicare Other

## 2016-01-26 ENCOUNTER — Ambulatory Visit: Payer: Medicare Other

## 2016-02-13 ENCOUNTER — Encounter: Payer: Self-pay | Admitting: *Deleted

## 2016-02-21 ENCOUNTER — Telehealth: Payer: Self-pay | Admitting: *Deleted

## 2016-02-21 NOTE — Telephone Encounter (Signed)
Patient called to state she was still having some discharge from her left breast, where there was an abscess in October 2016, denies fever and chills. Wanted to speak with the nurse.  Also, she has a follow up mammogram in 04/2016 wanted to know if the area in her left breast be ok to have that mammogram?

## 2016-02-22 NOTE — Telephone Encounter (Signed)
The patient reports the left breast drainage secondary to fat necrosis remains unchanged. She was concerned if she could proceed with her planned mammogram in May 2017 if the drainage persisted. There is no problem with her having her scheduled mammogram.  She reported concerned about the persistent drainage, and I reviewed with her our options: 1) repeat wide excision to tissue uninvolved by the chronic inflammatory changes of postoperative surgery and radiation versus 2) sees observation.

## 2016-03-05 ENCOUNTER — Ambulatory Visit (INDEPENDENT_AMBULATORY_CARE_PROVIDER_SITE_OTHER): Payer: Medicare Other | Admitting: Podiatry

## 2016-03-05 ENCOUNTER — Encounter: Payer: Self-pay | Admitting: Podiatry

## 2016-03-05 DIAGNOSIS — B351 Tinea unguium: Secondary | ICD-10-CM | POA: Diagnosis not present

## 2016-03-05 DIAGNOSIS — M79676 Pain in unspecified toe(s): Secondary | ICD-10-CM | POA: Diagnosis not present

## 2016-03-05 NOTE — Progress Notes (Signed)
She presents today with chief complaint of painful elongated toenails bilaterally. She states that she feels her neuropathy is causing her to fall.  Objective: Vital signs are stable she's alert and oriented 3. Pulses are palpable. Her toenails are thick yellow dystrophic, mycotic and painful palpation. Neurologic sensorium is slightly diminished per Semmes-Weinstein monofilament.  Assessment: Neuropathy with pain in limb secondary to onychomycosis.  Plan: Offered her medication for neuropathy however she declined today stating that she would like to talk to her nephrologist about first. I also debrided her toenails 1 through 5 bilateral.

## 2016-03-27 ENCOUNTER — Ambulatory Visit (INDEPENDENT_AMBULATORY_CARE_PROVIDER_SITE_OTHER): Payer: Medicare Other | Admitting: General Surgery

## 2016-03-27 ENCOUNTER — Encounter: Payer: Self-pay | Admitting: General Surgery

## 2016-03-27 ENCOUNTER — Other Ambulatory Visit: Payer: Self-pay

## 2016-03-27 VITALS — BP 116/70 | HR 72 | Resp 14 | Ht 65.0 in | Wt 185.0 lb

## 2016-03-27 DIAGNOSIS — Z853 Personal history of malignant neoplasm of breast: Secondary | ICD-10-CM | POA: Diagnosis not present

## 2016-03-27 DIAGNOSIS — N641 Fat necrosis of breast: Secondary | ICD-10-CM | POA: Diagnosis not present

## 2016-03-27 DIAGNOSIS — N63 Unspecified lump in breast: Secondary | ICD-10-CM | POA: Diagnosis not present

## 2016-03-27 DIAGNOSIS — N6320 Unspecified lump in the left breast, unspecified quadrant: Secondary | ICD-10-CM | POA: Insufficient documentation

## 2016-03-27 NOTE — Patient Instructions (Addendum)
The patient is aware to call back for any questions or concerns. Follow up scheduled for 05-20-16.

## 2016-03-27 NOTE — Progress Notes (Signed)
Patient ID: Hailey Ellison, female   DOB: June 25, 1935, 81 y.o.   MRN: [id number]  Chief Complaint  Patient presents with  . Breast Problem    HPI Hailey Ellison is a 81 y.o. female.  Here today for evaluation of a new lump left breast. She states she noticed this spot while bathing the weekend of March 25. She states it has not changed is size and denies pain. This area is above the spot that still has brownish draining. She is wearing her lymphedema sleeve left arm.  HPI  Past Medical History  Diagnosis Date  . Hypertension   . Murmur   . Radiation 2010    with chemo  . Arthritis   . Lymphedema   . Chronic kidney disease 2014  . Cancer ([place]) 2010    T1c,N2a, M0; ER 90%, PR 60%,  HER-2/neu 2+ IHC, equivocal on fish. 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  Margins clear.  . Breast cancer ([place])     Adjuvant chemotherapy with Taxotere/Carboplatin/Herceptin. Arimidex initiated November 2011  . Squamous cell carcinoma [facility]) June 2016    Left upper inner chest wall, area of previous radiation.    Past Surgical History  Procedure Laterality Date  . Partial hysterectomy  1980's  . Carpal tunnel release Bilateral   . Port-a-cath removal    . Cholecystectomy    . Joint replacement Left 2008  . Breast biopsy Left 2010  . Breast cyst excision    . Breast biopsy Right   . Breast lumpectomy Left 2010    Family History  Problem Relation Age of Onset  . Cancer Sister     breast  . Cancer Sister     kidney    Social History Social History  Substance Use Topics  . Smoking status: Former Research scientist (life sciences)  . Smokeless tobacco: Never Used  . Alcohol Use: No    Allergies  Allergen Reactions  . Benadryl [Diphenhydramine Hcl] Hives  . Tape   . Tegaderm Ag Mesh [Silver]     tegaderm  . Neosporin [Neomycin-Bacitracin Zn-Polymyx] Rash    Current Outpatient  Prescriptions  Medication Sig Dispense Refill  . anastrozole (ARIMIDEX) 1 MG tablet Take 1 mg by mouth daily.    . calcium citrate-vitamin D (CITRACAL+D) 315-200 MG-UNIT per tablet Take 1 tablet by mouth 2 (two) times daily.    Marland Kitchen dipyridamole-aspirin (AGGRENOX) 200-25 MG per 12 hr capsule Take 1 capsule by mouth 2 (two) times daily.    Marland Kitchen glucosamine-chondroitin 500-400 MG tablet Take 2 tablets by mouth daily.    . lansoprazole (PREVACID SOLUTAB) 30 MG disintegrating tablet Take 30 mg by mouth daily.    . Multiple Vitamin (MULTIVITAMIN WITH MINERALS) TABS Take 1 tablet by mouth daily.    . valsartan (DIOVAN) 320 MG tablet Take 320 mg by mouth daily.    . verapamil (CALAN) 120 MG tablet Take 60 mg by mouth daily.     No current facility-administered medications for this visit.    Review of Systems Review of Systems  Constitutional: Negative.   Respiratory: Negative.   Cardiovascular: Negative.     Blood pressure 116/70, pulse 72, resp. rate 14, height '5\' 5"'$  (1.651 m), weight 185 lb (83.915 kg).  Physical Exam Physical Exam  Constitutional: She is oriented to person, place, and time. She appears well-developed and well-nourished.  HENT:  Mouth/Throat: Oropharynx is clear and  moist.  Eyes: Conjunctivae are normal. No scleral icterus.  Neck: Neck supple.  Cardiovascular: Normal rate and regular rhythm.   Murmur heard.  Systolic murmur is present with a grade of 3/6  Pulmonary/Chest: Effort normal and breath sounds normal. Right breast exhibits no inverted nipple, no mass, no nipple discharge, no skin change and no tenderness. Left breast exhibits no inverted nipple, no mass, no nipple discharge, no skin change and no tenderness.    Lymphadenopathy:    She has no cervical adenopathy.    She has no axillary adenopathy.  Neurological: She is alert and oriented to person, place, and time.  Skin: Skin is warm and dry.  Psychiatric: Her behavior is normal.    Data  Reviewed Ultrasound examination of the area of recently appreciated fullness above the previous wide excision site was completed. The area of chronic ulceration shows a 1.4 x 1.7 cm dense area of scarring consistent with fat necrosis. 2 cm above this at the area of palpable fullness there is only adipose tissue with no evidence of architectural distortion. BI-RADS-2.  Assessment    Chronic sinus left breast status post excision basal cell carcinoma with underlying fat necrosis.  Prominence of the left breast above the area secondary to ongoing scarring.      Plan    We'll plan for follow-up as previously scheduled. The only viable management routine for the chronic drainage which is of great distress for the patient would be a wide excision outside the site of her original boost radiation.  She raised a question about whether she could go swimming. She would be a good candidate to use a Tegaderm dressing over the site if she is going to be in an outdoor body of water. Not necessary with a chlorinated pool.     Follow up scheduled for 05-20-16.   PCP:  [place] This information has been scribed by Karie Fetch RN, BSN,BC.    Hailey Ellison 03/27/2016, 3:55 PM

## 2016-04-10 ENCOUNTER — Other Ambulatory Visit: Payer: Self-pay | Admitting: Physician Assistant

## 2016-04-10 DIAGNOSIS — S83261D Peripheral tear of lateral meniscus, current injury, right knee, subsequent encounter: Secondary | ICD-10-CM

## 2016-04-27 ENCOUNTER — Ambulatory Visit
Admission: RE | Admit: 2016-04-27 | Discharge: 2016-04-27 | Disposition: A | Discharge status: Home / Self Care | Payer: Medicare Other | Source: Ambulatory Visit | Attending: Physician Assistant | Admitting: Physician Assistant

## 2016-04-27 DIAGNOSIS — M948X6 Other specified disorders of cartilage, lower leg: Secondary | ICD-10-CM | POA: Diagnosis not present

## 2016-04-27 DIAGNOSIS — X58XXXA Exposure to other specified factors, initial encounter: Secondary | ICD-10-CM | POA: Insufficient documentation

## 2016-04-27 DIAGNOSIS — S83261A Peripheral tear of lateral meniscus, current injury, right knee, initial encounter: Secondary | ICD-10-CM | POA: Diagnosis present

## 2016-04-27 DIAGNOSIS — S83261D Peripheral tear of lateral meniscus, current injury, right knee, subsequent encounter: Secondary | ICD-10-CM

## 2016-05-07 ENCOUNTER — Encounter: Payer: Self-pay | Admitting: General Surgery

## 2016-05-08 ENCOUNTER — Ambulatory Visit: Payer: Medicare Other | Admitting: General Surgery

## 2016-05-10 ENCOUNTER — Ambulatory Visit: Payer: Medicare Other | Admitting: General Surgery

## 2016-05-16 ENCOUNTER — Ambulatory Visit (INDEPENDENT_AMBULATORY_CARE_PROVIDER_SITE_OTHER): Payer: Medicare Other | Admitting: General Surgery

## 2016-05-16 ENCOUNTER — Encounter: Payer: Self-pay | Admitting: General Surgery

## 2016-05-16 VITALS — BP 140/72 | HR 82 | Resp 12 | Ht 65.0 in | Wt 188.0 lb

## 2016-05-16 DIAGNOSIS — Z853 Personal history of malignant neoplasm of breast: Secondary | ICD-10-CM

## 2016-05-16 DIAGNOSIS — N641 Fat necrosis of breast: Secondary | ICD-10-CM

## 2016-05-16 NOTE — Progress Notes (Signed)
Patient ID: Hailey Ellison, female   DOB: 08-11-1935, 81 y.o.   MRN: [id number]  Chief Complaint  Patient presents with  . Follow-up    mammogram    HPI ERANDY Ellison is a 81 y.o. female. here with a history of left breast cancer who presents for a breast evaluation. The most recent mammogram was done on 05/04/16. Patient does perform regular self breast checks and gets regular mammograms done. She still has a tiny amount of drainage on a daily bases. She is wearing her lymphedema sleeve on the left arm.  She has an appointment with Dr. Marry Guan tomorrow for a right knee evaluation.    HPI  Past Medical History  Diagnosis Date  . Hypertension   . Murmur   . Radiation 2010    with chemo  . Arthritis   . Lymphedema   . Chronic kidney disease 2014  . Cancer ([facility]) 2010    T1c,N2a, M0; ER 90%, PR 60%,  HER-2/neu 2+ IHC, equivocal on fish. 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  Margins clear.  . Breast cancer ([place])     Adjuvant chemotherapy with Taxotere/Carboplatin/Herceptin. Arimidex initiated November 2011  . Squamous cell carcinoma [facility]) June 2016    Left upper inner chest wall, area of previous radiation.    Past Surgical History  Procedure Laterality Date  . Partial hysterectomy  1980's  . Carpal tunnel release Bilateral   . Port-a-cath removal    . Cholecystectomy    . Joint replacement Left 2008  . Breast biopsy Left 2010  . Breast cyst excision    . Breast biopsy Right   . Breast lumpectomy Left 2010    Family History  Problem Relation Age of Onset  . Cancer Sister     breast  . Cancer Sister     kidney    Social History Social History  Substance Use Topics  . Smoking status: Former Research scientist (life sciences)  . Smokeless tobacco: Never Used  . Alcohol Use: No    Allergies  Allergen Reactions  . Benadryl [Diphenhydramine Hcl] Hives  . Tape   .  Tegaderm Ag Mesh [Silver]     tegaderm  . Neosporin [Neomycin-Bacitracin Zn-Polymyx] Rash    Current Outpatient Prescriptions  Medication Sig Dispense Refill  . anastrozole (ARIMIDEX) 1 MG tablet Take 1 mg by mouth daily.    . calcium citrate-vitamin D (CITRACAL+D) 315-200 MG-UNIT per tablet Take 1 tablet by mouth 2 (two) times daily.    Marland Kitchen dipyridamole-aspirin (AGGRENOX) 200-25 MG per 12 hr capsule Take 1 capsule by mouth 2 (two) times daily.    Marland Kitchen glucosamine-chondroitin 500-400 MG tablet Take 2 tablets by mouth daily.    . lansoprazole (PREVACID SOLUTAB) 30 MG disintegrating tablet Take 30 mg by mouth daily.    . Multiple Vitamin (MULTIVITAMIN WITH MINERALS) TABS Take 1 tablet by mouth daily.    . valsartan (DIOVAN) 320 MG tablet Take 320 mg by mouth daily.    . verapamil (CALAN) 120 MG tablet Take 60 mg by mouth daily.     No current facility-administered medications for this visit.    Review of Systems Review of Systems  Constitutional: Negative.   Respiratory: Negative.   Cardiovascular: Negative.     Blood pressure 140/72, pulse 82, resp. rate 12, height '5\' 5"'$  (1.651 m), weight 188 lb (85.276 kg).  Physical Exam Physical Exam  Constitutional:  She is oriented to person, place, and time. She appears well-developed and well-nourished.  HENT:  Mouth/Throat: Oropharynx is clear and moist.  Eyes: Conjunctivae are normal. No scleral icterus.  Neck: Neck supple.  Cardiovascular: Normal rate and regular rhythm.   Murmur heard.  Systolic murmur is present with a grade of 3/6  Pulmonary/Chest: Effort normal and breath sounds normal. Right breast exhibits no inverted nipple, no mass, no nipple discharge, no skin change and no tenderness. Left breast exhibits no inverted nipple, no mass, no nipple discharge, no skin change and no tenderness.    Pinpoint opening minimal drainage medial incision with mild thickening of skin right breast.  Musculoskeletal:       Arms: Upper extremity  circumference was measured at location 15 cm above the olecranon process as well as 10 and 20 cm below the olecranon process.  Right: 30, 26, 19 cm.  Left: 31.5, 30.5, 23.5 cm.  Lymphadenopathy:    She has no cervical adenopathy.    She has no axillary adenopathy.  Neurological: She is alert and oriented to person, place, and time.  Skin: Skin is warm and dry.  Psychiatric: Her behavior is normal.    Data Reviewed Bilateral diagnostic mammograms dated 05/04/2016 completed at [facility] were reviewed. Postsurgical changes with evidence of extensive fat necrosis. BIRAD-2. The patient reports the radiologist suggested that this might be secondary to lymphedema, but encouraged her to follow-up with her physician. These films of an independently reviewed. Assessment    No evidence of recurrent breast cancer.  Ongoing drainage from fat necrosis, possibly aggravated by a long-standing left upper extremity lymphedema.    Plan    The patient has not had a new sleeve in several years. She thinks that her left arm swelling is more pronounced. She was encouraged to be refitted.    She will think about considering surgery for excision of the chronically inflamed tissue and primary closure.  Patient will be asked to return to the office in one year with a bilateral diagnostic mammogram.    PCP:  [name]  This information has been scribed by Karie Fetch RN, BSN,BC.   Robert Bellow 05/17/2016, 12:18 PM

## 2016-05-16 NOTE — Patient Instructions (Addendum)
The patient is aware to call back for any questions or concerns. The patient has been asked to return to the office in one year with a bilateral diagnostic mammogram. 

## 2016-05-18 ENCOUNTER — Telehealth: Payer: Self-pay

## 2016-05-18 NOTE — Telephone Encounter (Signed)
Order sent for refitting of lymphedema sleeve for patient. Maureen at [facility] will contact the patient to arrange having this done. Patient is aware.

## 2016-05-18 NOTE — Telephone Encounter (Signed)
-----   Message from Robert Bellow, MD sent at 05/17/2016 12:26 PM EDT ----- The patient should be refitted for her lymphedema garment. Please make arrangements for this to happen.

## 2016-06-04 ENCOUNTER — Telehealth: Payer: Self-pay | Admitting: General Surgery

## 2016-06-04 NOTE — Telephone Encounter (Signed)
I spoke with Dr. Marry Guan this morning regarding the patient's upcoming knee replacement. Cultures from August 2016 showed MSSA as as well as Enterobacter. Both were sensitive to vancomycin. This is the preferred preoperative antibiotics in discussion with Dr. Marry Guan. No additional measures are required regarding her chronic breast sinus thought secondary to ongoing Necrosis.

## 2016-06-08 ENCOUNTER — Encounter: Payer: Self-pay | Admitting: Cardiovascular Disease

## 2016-06-08 ENCOUNTER — Inpatient Hospital Stay
Admit: 2016-06-08 | Discharge: 2016-06-08 | Disposition: A | Discharge status: Home / Self Care | Payer: Medicare Other | Attending: Cardiovascular Disease | Admitting: Cardiovascular Disease

## 2016-06-08 ENCOUNTER — Inpatient Hospital Stay
Admission: EM | Admit: 2016-06-08 | Discharge: 2016-06-10 | DRG: 247 | Disposition: A | Discharge status: Home / Self Care | Payer: Medicare Other | Attending: Internal Medicine | Admitting: Internal Medicine

## 2016-06-08 ENCOUNTER — Ambulatory Visit (HOSPITAL_COMMUNITY): Admission: EM | Admit: 2016-06-08 | Payer: Medicare Other | Admitting: Cardiology

## 2016-06-08 ENCOUNTER — Encounter
Admission: EM | Disposition: A | Discharge status: Home / Self Care | Payer: Self-pay | Source: Home / Self Care | Attending: Internal Medicine

## 2016-06-08 DIAGNOSIS — Z6831 Body mass index (BMI) 31.0-31.9, adult: Secondary | ICD-10-CM

## 2016-06-08 DIAGNOSIS — E785 Hyperlipidemia, unspecified: Secondary | ICD-10-CM | POA: Diagnosis present

## 2016-06-08 DIAGNOSIS — R001 Bradycardia, unspecified: Secondary | ICD-10-CM | POA: Diagnosis present

## 2016-06-08 DIAGNOSIS — N183 Chronic kidney disease, stage 3 (moderate): Secondary | ICD-10-CM | POA: Diagnosis present

## 2016-06-08 DIAGNOSIS — Z853 Personal history of malignant neoplasm of breast: Secondary | ICD-10-CM | POA: Diagnosis not present

## 2016-06-08 DIAGNOSIS — E669 Obesity, unspecified: Secondary | ICD-10-CM | POA: Diagnosis present

## 2016-06-08 DIAGNOSIS — I2119 ST elevation (STEMI) myocardial infarction involving other coronary artery of inferior wall: Secondary | ICD-10-CM | POA: Diagnosis present

## 2016-06-08 DIAGNOSIS — K219 Gastro-esophageal reflux disease without esophagitis: Secondary | ICD-10-CM | POA: Diagnosis present

## 2016-06-08 DIAGNOSIS — Z923 Personal history of irradiation: Secondary | ICD-10-CM

## 2016-06-08 DIAGNOSIS — Z87891 Personal history of nicotine dependence: Secondary | ICD-10-CM | POA: Diagnosis not present

## 2016-06-08 DIAGNOSIS — I2582 Chronic total occlusion of coronary artery: Secondary | ICD-10-CM | POA: Diagnosis present

## 2016-06-08 DIAGNOSIS — Z803 Family history of malignant neoplasm of breast: Secondary | ICD-10-CM | POA: Diagnosis not present

## 2016-06-08 DIAGNOSIS — Z9221 Personal history of antineoplastic chemotherapy: Secondary | ICD-10-CM | POA: Diagnosis not present

## 2016-06-08 DIAGNOSIS — I2111 ST elevation (STEMI) myocardial infarction involving right coronary artery: Secondary | ICD-10-CM | POA: Insufficient documentation

## 2016-06-08 DIAGNOSIS — Z8051 Family history of malignant neoplasm of kidney: Secondary | ICD-10-CM | POA: Diagnosis not present

## 2016-06-08 DIAGNOSIS — Z888 Allergy status to other drugs, medicaments and biological substances status: Secondary | ICD-10-CM | POA: Diagnosis not present

## 2016-06-08 DIAGNOSIS — I2510 Atherosclerotic heart disease of native coronary artery without angina pectoris: Secondary | ICD-10-CM | POA: Diagnosis not present

## 2016-06-08 DIAGNOSIS — I25119 Atherosclerotic heart disease of native coronary artery with unspecified angina pectoris: Secondary | ICD-10-CM | POA: Diagnosis present

## 2016-06-08 DIAGNOSIS — I129 Hypertensive chronic kidney disease with stage 1 through stage 4 chronic kidney disease, or unspecified chronic kidney disease: Secondary | ICD-10-CM | POA: Diagnosis present

## 2016-06-08 DIAGNOSIS — R079 Chest pain, unspecified: Secondary | ICD-10-CM

## 2016-06-08 DIAGNOSIS — Z7982 Long term (current) use of aspirin: Secondary | ICD-10-CM

## 2016-06-08 HISTORY — PX: CARDIAC CATHETERIZATION: SHX172

## 2016-06-08 LAB — CBC
HEMATOCRIT: 35.6 % (ref 35.0–47.0)
Hemoglobin: 12.6 g/dL (ref 12.0–16.0)
MCH: 33.7 pg (ref 26.0–34.0)
MCHC: 35.3 g/dL (ref 32.0–36.0)
MCV: 95.5 fL (ref 80.0–100.0)
Platelets: 137 10*3/uL — ABNORMAL LOW (ref 150–440)
RBC: 3.73 MIL/uL — ABNORMAL LOW (ref 3.80–5.20)
RDW: 12.1 % (ref 11.5–14.5)
WBC: 7.1 10*3/uL (ref 3.6–11.0)

## 2016-06-08 LAB — ECHOCARDIOGRAM COMPLETE
AO mean calculated velocity dopler: 252 cm/s
AOPV: 0.27 m/s
AOVTI: 86.9 cm
AV Area VTI index: 0.49 cm2/m2
AV Area VTI: 1.04 cm2
AV Area mean vel: 0.97 cm2
AV VEL mean LVOT/AV: 0.25
AV area mean vel ind: 0.48 cm2/m2
AV vel: 0.98
AVA: 0.98 cm2
AVG: 29 mmHg
AVPG: 48 mmHg
AVPKVEL: 347 cm/s
CHL CUP AV PEAK INDEX: 0.52
CHL CUP DOP CALC LVOT VTI: 22.3 cm
CHL CUP MV DEC (S): 370
E decel time: 370 msec
EERAT: 13.37
FS: 33 % (ref 28–44)
Height: 65 in
IVS/LV PW RATIO, ED: 0.96
LA diam end sys: 47 mm
LA vol index: 38.1 mL/m2
LA vol: 76.3 mL
LADIAMINDEX: 2.35 cm/m2
LASIZE: 47 mm
LAVOLA4C: 78.8 mL
LV E/e' medial: 13.37
LV PW d: 12.7 mm — AB (ref 0.6–1.1)
LV TDI E'LATERAL: 5.07
LV TDI E'MEDIAL: 2.82
LVEEAVG: 13.37
LVELAT: 5.07 cm/s
LVOT SV: 85 mL
LVOT area: 3.8 cm2
LVOT diameter: 22 mm
LVOT peak VTI: 0.26 cm
LVOT peak vel: 95.1 cm/s
MV pk A vel: 107 m/s
MV pk E vel: 67.8 m/s
P 1/2 time: 505 ms
Valve area index: 0.49
Weight: 2998.26 oz

## 2016-06-08 LAB — LIPID PANEL
CHOLESTEROL: 135 mg/dL (ref 0–200)
HDL: 47 mg/dL (ref >40)
LDL Cholesterol: 81 mg/dL (ref 0–99)
TRIGLYCERIDES: 37 mg/dL (ref <150)
Total CHOL/HDL Ratio: 2.9 RATIO
VLDL: 7 mg/dL (ref 0–40)

## 2016-06-08 LAB — BASIC METABOLIC PANEL
Anion gap: 8 (ref 5–15)
BUN: 25 mg/dL — AB (ref 6–20)
CHLORIDE: 103 mmol/L (ref 101–111)
CO2: 25 mmol/L (ref 22–32)
Calcium: 9 mg/dL (ref 8.9–10.3)
Creatinine, Ser: 1.56 mg/dL — ABNORMAL HIGH (ref 0.44–1.00)
GFR calc Af Amer: 36 mL/min — ABNORMAL LOW (ref >60)
GFR calc non Af Amer: 31 mL/min — ABNORMAL LOW (ref >60)
Glucose, Bld: 115 mg/dL — ABNORMAL HIGH (ref 65–99)
POTASSIUM: 5 mmol/L (ref 3.5–5.1)
SODIUM: 136 mmol/L (ref 135–145)

## 2016-06-08 LAB — SURGICAL PCR SCREEN
MRSA, PCR: NEGATIVE
STAPHYLOCOCCUS AUREUS: NEGATIVE

## 2016-06-08 LAB — GLUCOSE, CAPILLARY: Glucose-Capillary: 108 mg/dL — ABNORMAL HIGH (ref 65–99)

## 2016-06-08 LAB — TROPONIN I: Troponin I: 1.66 ng/mL — ABNORMAL HIGH (ref <0.031)

## 2016-06-08 SURGERY — LEFT HEART CATH AND CORONARY ANGIOGRAPHY
Anesthesia: Moderate Sedation

## 2016-06-08 MED ORDER — MIDAZOLAM HCL 2 MG/2ML IJ SOLN
INTRAMUSCULAR | Status: AC
  Filled 2016-06-08: qty 2

## 2016-06-08 MED ORDER — GLUCOSAMINE-CHONDROITIN 500-400 MG PO TABS
2.0000 | ORAL_TABLET | Freq: Every day | ORAL | Status: DC
Start: 2016-06-08 — End: 2016-06-08

## 2016-06-08 MED ORDER — HEPARIN SODIUM (PORCINE) 1000 UNIT/ML IJ SOLN
INTRAMUSCULAR | Status: AC
  Filled 2016-06-08: qty 1

## 2016-06-08 MED ORDER — TICAGRELOR 90 MG PO TABS
ORAL_TABLET | ORAL | Status: AC
  Filled 2016-06-08: qty 2

## 2016-06-08 MED ORDER — PAROXETINE HCL 20 MG PO TABS
40.0000 mg | ORAL_TABLET | Freq: Every day | ORAL | Status: DC
  Administered 2016-06-08 – 2016-06-10 (×3): 40 mg via ORAL
  Filled 2016-06-08 (×3): qty 2

## 2016-06-08 MED ORDER — ADULT MULTIVITAMIN W/MINERALS CH
1.0000 | ORAL_TABLET | Freq: Every day | ORAL | Status: DC
  Administered 2016-06-08 – 2016-06-10 (×3): 1 via ORAL
  Filled 2016-06-08 (×3): qty 1

## 2016-06-08 MED ORDER — HEPARIN SODIUM (PORCINE) 1000 UNIT/ML IJ SOLN
INTRAMUSCULAR | Status: DC | PRN
  Administered 2016-06-08: 7000 [IU] via INTRAVENOUS
  Administered 2016-06-08: 3000 [IU] via INTRAVENOUS

## 2016-06-08 MED ORDER — IRBESARTAN 150 MG PO TABS
150.0000 mg | ORAL_TABLET | Freq: Every day | ORAL | Status: DC
  Administered 2016-06-08 – 2016-06-10 (×3): 150 mg via ORAL
  Filled 2016-06-08 (×2): qty 2
  Filled 2016-06-08: qty 1

## 2016-06-08 MED ORDER — SODIUM CHLORIDE 0.9% FLUSH
3.0000 mL | Freq: Two times a day (BID) | INTRAVENOUS | Status: DC
  Administered 2016-06-09 – 2016-06-10 (×3): 3 mL via INTRAVENOUS

## 2016-06-08 MED ORDER — SODIUM CHLORIDE 0.9 % IV SOLN: 250.0000 mL | INTRAVENOUS | Status: DC | PRN

## 2016-06-08 MED ORDER — NITROGLYCERIN 1 MG/10 ML FOR IR/CATH LAB
INTRA_ARTERIAL | Status: DC | PRN
  Administered 2016-06-08: 200 ug
  Administered 2016-06-08: 200 ug via INTRACORONARY

## 2016-06-08 MED ORDER — NITROGLYCERIN 5 MG/ML IV SOLN
INTRAVENOUS | Status: AC
  Filled 2016-06-08: qty 10

## 2016-06-08 MED ORDER — TICAGRELOR 90 MG PO TABS
ORAL_TABLET | ORAL | Status: DC | PRN
  Administered 2016-06-08: 180 mg via ORAL

## 2016-06-08 MED ORDER — ONDANSETRON HCL 4 MG/2ML IJ SOLN: 4.0000 mg | Freq: Four times a day (QID) | INTRAMUSCULAR | Status: DC | PRN

## 2016-06-08 MED ORDER — TICAGRELOR 90 MG PO TABS
90.0000 mg | ORAL_TABLET | Freq: Two times a day (BID) | ORAL | Status: DC
  Administered 2016-06-08 – 2016-06-10 (×5): 90 mg via ORAL
  Filled 2016-06-08 (×5): qty 1

## 2016-06-08 MED ORDER — IOPAMIDOL (ISOVUE-300) INJECTION 61%
INTRAVENOUS | Status: DC | PRN
  Administered 2016-06-08: 145 mL via INTRA_ARTERIAL

## 2016-06-08 MED ORDER — FENTANYL CITRATE (PF) 100 MCG/2ML IJ SOLN
INTRAMUSCULAR | Status: DC | PRN
  Administered 2016-06-08 (×2): 25 ug via INTRAVENOUS

## 2016-06-08 MED ORDER — SODIUM CHLORIDE 0.9% FLUSH: 3.0000 mL | INTRAVENOUS | Status: DC | PRN

## 2016-06-08 MED ORDER — ANASTROZOLE 1 MG PO TABS
1.0000 mg | ORAL_TABLET | Freq: Every day | ORAL | Status: DC
  Administered 2016-06-08 – 2016-06-10 (×3): 1 mg via ORAL
  Filled 2016-06-08 (×4): qty 1

## 2016-06-08 MED ORDER — ASPIRIN 81 MG PO CHEW
81.0000 mg | CHEWABLE_TABLET | Freq: Every day | ORAL | Status: DC
  Administered 2016-06-08 – 2016-06-10 (×3): 81 mg via ORAL
  Filled 2016-06-08 (×3): qty 1

## 2016-06-08 MED ORDER — FENTANYL CITRATE (PF) 100 MCG/2ML IJ SOLN
INTRAMUSCULAR | Status: AC
  Filled 2016-06-08: qty 2

## 2016-06-08 MED ORDER — MIDAZOLAM HCL 2 MG/2ML IJ SOLN
INTRAMUSCULAR | Status: DC | PRN
  Administered 2016-06-08: 1 mg via INTRAVENOUS
  Administered 2016-06-08: 0.5 mg via INTRAVENOUS

## 2016-06-08 MED ORDER — CALCIUM CITRATE-VITAMIN D 500-400 MG-UNIT PO CHEW
1.0000 | CHEWABLE_TABLET | Freq: Every day | ORAL | Status: DC
  Administered 2016-06-08 – 2016-06-10 (×3): 1 via ORAL
  Filled 2016-06-08 (×3): qty 1

## 2016-06-08 MED ORDER — CARVEDILOL 3.125 MG PO TABS
3.1250 mg | ORAL_TABLET | Freq: Two times a day (BID) | ORAL | Status: DC
  Administered 2016-06-08 – 2016-06-09 (×2): 3.125 mg via ORAL
  Filled 2016-06-08 (×2): qty 1

## 2016-06-08 MED ORDER — PANTOPRAZOLE SODIUM 40 MG PO TBEC
40.0000 mg | DELAYED_RELEASE_TABLET | Freq: Every day | ORAL | Status: DC
  Administered 2016-06-08 – 2016-06-10 (×3): 40 mg via ORAL
  Filled 2016-06-08 (×3): qty 1

## 2016-06-08 MED ORDER — ACETAMINOPHEN 325 MG PO TABS
650.0000 mg | ORAL_TABLET | ORAL | Status: DC | PRN
  Administered 2016-06-08: 650 mg via ORAL
  Filled 2016-06-08: qty 2

## 2016-06-08 MED ORDER — SODIUM CHLORIDE 0.9 % IV SOLN
INTRAVENOUS | Status: DC
  Administered 2016-06-08: 14:00:00 via INTRAVENOUS

## 2016-06-08 MED ORDER — HEPARIN (PORCINE) IN NACL 2-0.9 UNIT/ML-% IJ SOLN
INTRAMUSCULAR | Status: AC
  Filled 2016-06-08: qty 1000

## 2016-06-08 MED ORDER — ATORVASTATIN CALCIUM 20 MG PO TABS
40.0000 mg | ORAL_TABLET | Freq: Every day | ORAL | Status: DC
  Administered 2016-06-08 – 2016-06-09 (×2): 40 mg via ORAL
  Filled 2016-06-08 (×2): qty 2

## 2016-06-08 MED ORDER — HYDROCHLOROTHIAZIDE 12.5 MG PO CAPS
12.5000 mg | ORAL_CAPSULE | Freq: Every day | ORAL | Status: DC
  Administered 2016-06-08: 12.5 mg via ORAL
  Filled 2016-06-08 (×3): qty 1

## 2016-06-08 SURGICAL SUPPLY — 12 items
BALLN TREK RX 2.5X12 (BALLOONS) ×3
BALLOON TREK RX 2.5X12 (BALLOONS) ×1 IMPLANT
CATH OPTITORQUE JACKY 4.0 5F (CATHETERS) ×3 IMPLANT
CATH VISTA GUIDE 6FR JR4 (CATHETERS) ×3 IMPLANT
DEVICE INFLAT 30 PLUS (MISCELLANEOUS) ×3 IMPLANT
DEVICE RAD TR BAND REGULAR (VASCULAR PRODUCTS) ×3 IMPLANT
GLIDESHEATH SLEND SS 6F .021 (SHEATH) ×3 IMPLANT
KIT MANI 3VAL PERCEP (MISCELLANEOUS) ×3 IMPLANT
PACK CARDIAC CATH (CUSTOM PROCEDURE TRAY) ×3 IMPLANT
STENT RESOLUTE INTEG 2.75X22 (Permanent Stent) ×3 IMPLANT
WIRE RUNTHROUGH .014X180CM (WIRE) ×3 IMPLANT
WIRE SAFE-T 1.5MM-J .035X260CM (WIRE) ×3 IMPLANT

## 2016-06-08 NOTE — ED Notes (Signed)
Pt pre-arrived on status board, information obtained from [facility] via pager.

## 2016-06-08 NOTE — ED Notes (Signed)
Pt arrives here via [facility] from home  Pt was at the grocery store about 0900 this am and she began to feel "funny " in her chest  - pt drove home and then called EMS  Centralized chest pain with radiation to her neck and left arm  EMS transmitted ECG prior to arrival  [place] read ECG and called cath lab to inform them of pt's arrival    Upon arrival pt to room 15  - assessed by EDP and Arida and then to cath lab  Pt never left EMS stretcher  Repeat ECG performed just prior to transfer

## 2016-06-08 NOTE — ED Notes (Signed)
Pt transferred to cath lab  - escorted by me - EMS and Kirke Shaggy

## 2016-06-08 NOTE — H&P (Addendum)
[place] at [place] NAME: Hailey Ellison    MR#:  [id number]  DATE OF BIRTH:  09-17-1935  DATE OF ADMISSION:  06/08/2016  PRIMARY CARE PHYSICIAN: [name], MD   REQUESTING/REFERRING PHYSICIAN: Dr Fletcher Anon  CHIEF COMPLAINT:   Chest pain HISTORY OF PRESENT ILLNESS:  Hailey Ellison  is a 81 y.o. female with a known history of Breast cancer, essential hypertension and chronic kidney disease stage III who presented to the emergency room with chest pain. Patient reports that this morning she developed chest pain associated with nausea and shortness of breath. She came to the emergency room where she was diagnosed with ST elevation MI. She immediately underwent cardiac catheterization and now has a drug-eluting stent placed to the RCA. She is current a chest pain-free. She denies shortness of breath, nausea, vomiting or abdominal pain.  PAST MEDICAL HISTORY:   Past Medical History  Diagnosis Date  . Hypertension   . Murmur   . Radiation 2010    with chemo  . Arthritis   . Lymphedema   . Chronic kidney disease 2014  . Cancer ([place]) 2010    T1c,N2a, M0; ER 90%, PR 60%,  HER-2/neu 2+ IHC, equivocal on fish. 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  Margins clear.  . Breast cancer ([place])     Adjuvant chemotherapy with Taxotere/Carboplatin/Herceptin. Arimidex initiated November 2011  . Squamous cell carcinoma [facility]) June 2016    Left upper inner chest wall, area of previous radiation.    PAST SURGICAL HISTORY:   Past Surgical History  Procedure Laterality Date  . Partial hysterectomy  1980's  . Carpal tunnel release Bilateral   . Port-a-cath removal    . Cholecystectomy    . Joint replacement Left 2008  . Breast biopsy Left 2010  . Breast cyst excision    . Breast biopsy Right   . Breast lumpectomy Left 2010     SOCIAL HISTORY:   Social History  Substance Use Topics  . Smoking status: Former Research scientist (life sciences)  . Smokeless tobacco: Never Used  . Alcohol Use: No    FAMILY HISTORY:   Family History  Problem Relation Age of Onset  . Cancer Sister     breast  . Cancer Sister     kidney    DRUG ALLERGIES:   Allergies  Allergen Reactions  . Benadryl [Diphenhydramine Hcl] Hives  . Tape   . Tegaderm Ag Mesh [Silver]     tegaderm  . Neosporin [Neomycin-Bacitracin Zn-Polymyx] Rash    REVIEW OF SYSTEMS:   Review of Systems  Constitutional: Negative for fever, chills and malaise/fatigue.  HENT: Negative for ear discharge, ear pain, hearing loss, nosebleeds and sore throat.   Eyes: Negative for blurred vision and pain.  Respiratory: Negative for cough, hemoptysis, shortness of breath and wheezing.   Cardiovascular: Positive for chest pain. Negative for palpitations and leg swelling.  Gastrointestinal: Positive for nausea. Negative for vomiting, abdominal pain, diarrhea and blood in stool.  Genitourinary: Negative for dysuria.  Musculoskeletal: Negative for back pain.  Neurological: Negative for dizziness, tremors, speech change, focal weakness, seizures and headaches.  Endo/Heme/Allergies: Does not bruise/bleed easily.  Psychiatric/Behavioral: Negative for depression, suicidal ideas and hallucinations.    MEDICATIONS AT HOME:   Prior to Admission medications   Medication Sig Start Date End Date Taking? Authorizing Provider  anastrozole (ARIMIDEX) 1 MG tablet  Take 1 mg by mouth daily.   Yes Historical Provider, MD  atorvastatin (LIPITOR) 20 MG tablet Take 20 mg by mouth at bedtime.   Yes Historical Provider, MD  calcium citrate-vitamin D (CITRACAL+D) 315-200 MG-UNIT per tablet Take 1 tablet by mouth 2 (two) times daily.   Yes Historical Provider, MD  furosemide (LASIX) 20 MG tablet Take 20 mg by mouth daily as needed for fluid.    Yes Historical Provider, MD  glucosamine-chondroitin 500-400  MG tablet Take 2 tablets by mouth daily.   Yes Historical Provider, MD  hydrochlorothiazide (HYDRODIURIL) 25 MG tablet Take 12.5 mg by mouth daily.   Yes Historical Provider, MD  lansoprazole (PREVACID) 30 MG capsule Take 30 mg by mouth daily.   Yes Historical Provider, MD  Multiple Vitamin (MULTIVITAMIN WITH MINERALS) TABS Take 1 tablet by mouth daily.   Yes Historical Provider, MD  PARoxetine (PAXIL) 40 MG tablet Take 40 mg by mouth daily.   Yes Historical Provider, MD  valsartan (DIOVAN) 320 MG tablet Take 320 mg by mouth daily.   Yes Historical Provider, MD  verapamil (CALAN) 120 MG tablet Take 60 mg by mouth daily.   Yes Historical Provider, MD  dipyridamole-aspirin (AGGRENOX) 200-25 MG per 12 hr capsule Take 1 capsule by mouth 2 (two) times daily.    Historical Provider, MD      VITAL SIGNS:  Blood pressure 164/121, pulse 73, height _0  (1.651 m), weight 79.379 kg (175 lb), SpO2 97 %.  PHYSICAL EXAMINATION:   Physical Exam  Constitutional: She is oriented to person, place, and time and well-developed, well-nourished, and in no distress. No distress.  HENT:  Head: Normocephalic.  Eyes: No scleral icterus.  Neck: Normal range of motion. Neck supple. No JVD present. No tracheal deviation present.  Cardiovascular: Normal rate, regular rhythm and normal heart sounds.  Exam reveals no gallop and no friction rub.   No murmur heard. Pulmonary/Chest: Effort normal and breath sounds normal. No respiratory distress. She has no wheezes. She has no rales. She exhibits no tenderness.  Abdominal: Soft. Bowel sounds are normal. She exhibits no distension and no mass. There is no tenderness. There is no rebound and no guarding.  Musculoskeletal: Normal range of motion. She exhibits edema.  Chronic left arm lymphedema. Left arm is wrapped.  Neurological: She is alert and oriented to person, place, and time.  Skin: Skin is warm. No rash noted. No erythema.  Psychiatric: Affect and judgment normal.       LABORATORY PANEL:   CBC No results for input(s): WBC, HGB, HCT, PLT in the last 168 hours. ------------------------------------------------------------------------------------------------------------------  Chemistries  No results for input(s): NA, K, CL, CO2, GLUCOSE, BUN, CREATININE, CALCIUM, MG, AST, ALT, ALKPHOS, BILITOT in the last 168 hours.  Invalid input(s): GFRCGP ------------------------------------------------------------------------------------------------------------------  Cardiac Enzymes No results for input(s): TROPONINI in the last 168 hours. ------------------------------------------------------------------------------------------------------------------  RADIOLOGY:  No results found.  EKG:   ST elevation in inferior leads 2, 3 and aVF.  IMPRESSION AND PLAN:   82 year old female with a history of breast cancer in remission, essential hypertension and chronic kidney disease stage III who presented with chest pain and found to have ST elevation MI.  1. ST elevation MI: Patient underwent cardiac catheterization and now has drug-eluting stent in RCA. Continue aspirin, atorvastatin, Coreg, Avapro and Brillanta. Patient follows with [facility] cardiology.  2. Essential hypertension: Continue Coreg, HCTZ, Avapro.  3. Hyperlipidemia: Continue atorvastatin and follow up on lipid panel.  4. History of breast cancer  in remission: Continue ARIMEDEX.  5. GERD: Continue PPI  6. Chronic kidney disease stage III: Creatinine needs to be monitored.  All the records are reviewed and case discussed with ED provider. Management plans discussed with the patient and she is in agreement  CODE STATUS: FULL  TOTAL TIME TAKING CARE OF THIS PATIENT: 50 minutes.    Hailey Ellison M.D on 06/08/2016 at 12:56 PM  Between 7am to 6pm - Pager - [phone]  After 6pm go to [email or web address] - password [email or web address] [facility]  Office  [phone]  CC: Primary care  physician; [name], MD

## 2016-06-08 NOTE — ED Provider Notes (Signed)
[facility] Emergency Department Provider Note  ____________________________________________  Time seen: On EMS arrival  I have reviewed the triage vital signs and the nursing notes.   HISTORY  Chief Complaint Chest pain  History limited by: Not Limited   HPI Hailey Ellison is a 81 y.o. female who presented to the emergency department via EMS as a code STEMI. She developed chest pain this morning. It has been intermittent since then. It is located in the center chest. It is also located in the left chest. Some radiation into her neck. Describes it as pressure-like. Denies similar pain in the past. Denies history of heart attack or catheterization in the past.   Past Medical History  Diagnosis Date  . Hypertension   . Murmur   . Radiation 2010    with chemo  . Arthritis   . Lymphedema   . Chronic kidney disease 2014  . Cancer ([place]) 2010    T1c,N2a, M0; ER 90%, PR 60%,  HER-2/neu 2+ IHC, equivocal on fish. 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  Margins clear.  . Breast cancer ([place])     Adjuvant chemotherapy with Taxotere/Carboplatin/Herceptin. Arimidex initiated November 2011  . Squamous cell carcinoma [facility]) June 2016    Left upper inner chest wall, area of previous radiation.    Patient Active Problem List   Diagnosis Date Noted  . Breast mass, left 03/27/2016  . Fat necrosis (segmental) of breast 07/26/2015  . Squamous cell carcinoma in situ 05/25/2015  . Skin nodule 05/14/2015  . Lymphedema 08/05/2014  . Lump or mass in breast 07/08/2014  . Personal history of malignant neoplasm of breast 05/25/2014  . Breast cancer, stage 2 ([place]) 04/08/2013    Past Surgical History  Procedure Laterality Date  . Partial hysterectomy  1980's  . Carpal tunnel release Bilateral   . Port-a-cath removal    . Cholecystectomy    . Joint  replacement Left 2008  . Breast biopsy Left 2010  . Breast cyst excision    . Breast biopsy Right   . Breast lumpectomy Left 2010    Current Outpatient Rx  Name  Route  Sig  Dispense  Refill  . anastrozole (ARIMIDEX) 1 MG tablet   Oral   Take 1 mg by mouth daily.         . calcium citrate-vitamin D (CITRACAL+D) 315-200 MG-UNIT per tablet   Oral   Take 1 tablet by mouth 2 (two) times daily.         Marland Kitchen dipyridamole-aspirin (AGGRENOX) 200-25 MG per 12 hr capsule   Oral   Take 1 capsule by mouth 2 (two) times daily.         Marland Kitchen glucosamine-chondroitin 500-400 MG tablet   Oral   Take 2 tablets by mouth daily.         . lansoprazole (PREVACID SOLUTAB) 30 MG disintegrating tablet   Oral   Take 30 mg by mouth daily.         . Multiple Vitamin (MULTIVITAMIN WITH MINERALS) TABS   Oral   Take 1 tablet by mouth daily.         . valsartan (DIOVAN) 320 MG tablet   Oral   Take 320 mg by mouth daily.         . verapamil (CALAN) 120 MG tablet   Oral   Take 60 mg by mouth daily.  Allergies Benadryl; Tape; Tegaderm ag mesh; and Neosporin  Family History  Problem Relation Age of Onset  . Cancer Sister     breast  . Cancer Sister     kidney    Social History Social History  Substance Use Topics  . Smoking status: Former Research scientist (life sciences)  . Smokeless tobacco: Never Used  . Alcohol Use: No    Review of Systems  Constitutional: Negative for fever. Cardiovascular: Positive for chest pain Respiratory: Negative for shortness of breath. Gastrointestinal: Negative for abdominal pain, vomiting and diarrhea. Neurological: Negative for headaches, focal weakness or numbness.  10-point ROS otherwise negative.  ____________________________________________   PHYSICAL EXAM:  Constitutional: Alert and oriented. Slightly uncomfortable Eyes: Conjunctivae are normal. PERRL. Normal extraocular movements. ENT   Head: Normocephalic and atraumatic.   Nose: No  congestion/rhinnorhea.   Mouth/Throat: Mucous membranes are moist.   Neck: No stridor. Cardiovascular: Normal rate, regular rhythm.  Murmur present Respiratory: Normal respiratory effort without tachypnea nor retractions. Breath sounds are clear and equal bilaterally. No wheezes/rales/rhonchi. Gastrointestinal: Soft and nontender. Genitourinary: Deferred Musculoskeletal: Normal range of motion in all extremities.  Neurologic:  Normal speech and language. No gross focal neurologic deficits are appreciated.  Skin:  Skin is warm, dry and intact. No rash noted. Psychiatric: Mood and affect are normal. Speech and behavior are normal. Patient exhibits appropriate insight and judgment.  ____________________________________________    LABS (pertinent positives/negatives)  None  ____________________________________________   EKG  I, Nance Pear, attending physician, personally viewed and interpreted this EKG EMS EKG EKG Time: 1104 Rate: 69 Rhythm: normal sinus rhythm Axis: left Intervals: qtc 385 QRS: narrow ST changes: ST elevation II, III, aVF, st depression I, aVL, V3 Impression: acute ST elevation   I, Nance Pear, attending physician, personally viewed and interpreted this EKG  EKG Time: 1130 Rate: 68 Rhythm: normal sinus rhythm Axis: normal Intervals: qtc 422 QRS: narrow ST changes: st elevation aVF Impression: no longer STEMI criteria   ____________________________________________    RADIOLOGY  None  ____________________________________________   PROCEDURES  Procedure(s) performed: None  Critical Care performed: Yes, see critical care note(s)  CRITICAL CARE Performed by: Nance Pear   Total critical care time: 10 minutes  Critical care time was exclusive of separately billable procedures and treating other patients.  Critical care was necessary to treat or prevent imminent or life-threatening deterioration.  Critical care was  time spent personally by me on the following activities: development of treatment plan with patient and/or surrogate as well as nursing, discussions with consultants, evaluation of patient's response to treatment, examination of patient, obtaining history from patient or surrogate, ordering and performing treatments and interventions, ordering and review of laboratory studies, ordering and review of radiographic studies, pulse oximetry and re-evaluation of patient's condition.  ____________________________________________   INITIAL IMPRESSION / ASSESSMENT AND PLAN / ED COURSE  Pertinent labs & imaging results that were available during my care of the patient were reviewed by me and considered in my medical decision making (see chart for details).  Patient arrived to the emergency department today via EMS because of chest pressure and code STEMI. EMS EKG consistent with inferior infarct. Dr. Fletcher Anon with cardiology was present upon patient arrival to the emergency department. Given that patient was still with pain the decision was made to take patient directly to the catheterization lab. Patient had received aspirin by EMS.  ____________________________________________   FINAL CLINICAL IMPRESSION(S) / ED DIAGNOSES  Final diagnoses:  Chest pain, unspecified chest pain type  ST elevation  myocardial infarction involving right coronary artery [facility])     Note: This dictation was prepared with Dragon dictation. Any transcriptional errors that result from this process are unintentional    Nance Pear, MD 06/08/16 1146

## 2016-06-08 NOTE — Progress Notes (Signed)
Alerted Dr. Conley Canal to pt.'s surgical incision on L breast (done prior to this hospitalization), area is oozing green serous drainage- no odor, not painful, currently covered w/ bandaid from home. Clarified w/ MD dressing change/antibiotic coverage/wound culture MD ordered wound culture and to re-address with day team r/t to ongoing care of site.  Instructed to call back if there are s/s of infection. Pt.'s VSS, WBC normal.

## 2016-06-08 NOTE — ED Notes (Signed)
ED ECG completed

## 2016-06-09 DIAGNOSIS — I219 Acute myocardial infarction, unspecified: Secondary | ICD-10-CM

## 2016-06-09 HISTORY — DX: Acute myocardial infarction, unspecified: I21.9

## 2016-06-09 LAB — BASIC METABOLIC PANEL
ANION GAP: 7 (ref 5–15)
BUN: 21 mg/dL — ABNORMAL HIGH (ref 6–20)
CALCIUM: 8.9 mg/dL (ref 8.9–10.3)
CHLORIDE: 105 mmol/L (ref 101–111)
CO2: 26 mmol/L (ref 22–32)
Creatinine, Ser: 1.54 mg/dL — ABNORMAL HIGH (ref 0.44–1.00)
GFR calc non Af Amer: 31 mL/min — ABNORMAL LOW (ref >60)
GFR, EST AFRICAN AMERICAN: 36 mL/min — AB (ref >60)
Glucose, Bld: 111 mg/dL — ABNORMAL HIGH (ref 65–99)
Potassium: 3.9 mmol/L (ref 3.5–5.1)
SODIUM: 138 mmol/L (ref 135–145)

## 2016-06-09 LAB — TROPONIN I: TROPONIN I: 9.42 ng/mL — AB (ref <0.031)

## 2016-06-09 NOTE — Progress Notes (Signed)
Alerted Dr. Marcille Blanco to pt.'s elevated troponin, pt. Asymptomatic - VSS, will continue to monitor pt. Closely.

## 2016-06-09 NOTE — Progress Notes (Signed)
[place] at [place] NAME: Hailey Ellison    MR#:  [id number]  DATE OF BIRTH:  1934-12-26  SUBJECTIVE:  CHIEF COMPLAINT:   Chief Complaint  Patient presents with  . Chest Pain  . Code STEMI   No complaint. REVIEW OF SYSTEMS:  CONSTITUTIONAL: No fever, fatigue or weakness.  EYES: No blurred or double vision.  EARS, NOSE, AND THROAT: No tinnitus or ear pain.  RESPIRATORY: No cough, shortness of breath, wheezing or hemoptysis.  CARDIOVASCULAR: No chest pain, orthopnea, edema.  GASTROINTESTINAL: No nausea, vomiting, diarrhea or abdominal pain.  GENITOURINARY: No dysuria, hematuria.  ENDOCRINE: No polyuria, nocturia,  HEMATOLOGY: No anemia, easy bruising or bleeding SKIN: No rash or lesion. MUSCULOSKELETAL: No joint pain or arthritis.   NEUROLOGIC: No tingling, numbness, weakness.  PSYCHIATRY: No anxiety or depression.   DRUG ALLERGIES:   Allergies  Allergen Reactions  . Benadryl [Diphenhydramine Hcl] Hives  . Tape   . Tegaderm Ag Mesh [Silver]     tegaderm  . Neosporin [Neomycin-Bacitracin Zn-Polymyx] Rash    VITALS:  Blood pressure 125/52, pulse 65, temperature 98 F (36.7 C), temperature source Oral, resp. rate 24, height 5\' 5"  (1.651 m), weight 187 lb 6.3 oz (85 kg), SpO2 97 %.  PHYSICAL EXAMINATION:  GENERAL:  81 y.o.-year-old patient lying in the bed with no acute distress.  EYES: Pupils equal, round, reactive to light and accommodation. No scleral icterus. Extraocular muscles intact.  HEENT: Head atraumatic, normocephalic. Oropharynx and nasopharynx clear.  NECK:  Supple, no jugular venous distention. No thyroid enlargement, no tenderness.  LUNGS: Normal breath sounds bilaterally, no wheezing, rales,rhonchi or crepitation. No use of accessory muscles of respiration.  CARDIOVASCULAR: S1, S2 normal. No murmurs, rubs, or gallops.  ABDOMEN: Soft, nontender, nondistended. Bowel sounds present. No organomegaly or mass.   EXTREMITIES: No pedal edema, cyanosis, or clubbing. Chronic left arm lymphedema. Left arm is wrapped.  NEUROLOGIC: Cranial nerves II through XII are intact. Muscle strength 5/5 in all extremities. Sensation intact. Gait not checked.  PSYCHIATRIC: The patient is alert and oriented x 3.  SKIN: No obvious rash, lesion, or ulcer.    LABORATORY PANEL:   CBC  Recent Labs Lab 06/08/16 1336  WBC 7.1  HGB 12.6  HCT 35.6  PLT 137*   ------------------------------------------------------------------------------------------------------------------  Chemistries   Recent Labs Lab 06/09/16 0306  NA 138  K 3.9  CL 105  CO2 26  GLUCOSE 111*  BUN 21*  CREATININE 1.54*  CALCIUM 8.9   ------------------------------------------------------------------------------------------------------------------  Cardiac Enzymes  Recent Labs Lab 06/09/16 0306  TROPONINI 9.42*   ------------------------------------------------------------------------------------------------------------------  RADIOLOGY:  No results found.  EKG:   Orders placed or performed during the hospital encounter of 06/08/16  . EKG 12-Lead immediately post procedure  . EKG 12-Lead  . EKG 12-Lead immediately post procedure  . EKG 12-Lead    ASSESSMENT AND PLAN:   82 year old female with a history of breast cancer in remission, essential hypertension and chronic kidney disease stage III who presented with chest pain and found to have ST elevation MI.  1. ST elevation MI: Patient underwent cardiac catheterization and has drug-eluting stent in RCA. Continue aspirin, atorvastatin, Avapro and Brillanta. Hold coreg due to bradycardia per Dr. Clayborn Bigness.  2. Essential hypertension: Continue HCTZ, Avapro. Hold coreg due to bradycardia per Dr. Clayborn Bigness.  3. Hyperlipidemia: Continue atorvastatin and follow up on lipid panel.  4. History of breast cancer in remission: Continue ARIMEDEX.  5. GERD: Continue PPI  6.  Chronic kidney disease stage III: stable.  All the records are reviewed and case discussed with Care Management/Social Workerr. Management plans discussed with the patient, family and they are in agreement.  CODE STATUS: full code.  TOTAL TIME TAKING CARE OF THIS PATIENT: 37 minutes.  Greater than 50% time was spent on coordination of care and face-to-face counseling.  POSSIBLE D/C IN 2 DAYS, DEPENDING ON CLINICAL CONDITION.   Hailey Ellison M.D on 06/09/2016 at 2:11 PM  Between 7am to 6pm - Pager - [phone]  After 6pm go to [email or web address] - password [email or web address] [facility] [facility]  Office  [phone]  CC: Primary care physician; Hailey Body, MD

## 2016-06-09 NOTE — Progress Notes (Signed)
Dr.Callwood at bedside. Reported holding Carvedilol 3.125 mg due to pt HR at 57 BPM. Dr.Callwood acknowledged and advised to give now.   Verbal orders given to transfer to 2A

## 2016-06-09 NOTE — Progress Notes (Signed)
Initial Nutrition Assessment  DOCUMENTATION CODES:   Not applicable  INTERVENTION:   Cater to pt preferences on Heart Healthy/Carb Modified Diet order Education: RD encouraged pt to increase fruits and vegetables into pt's diet as able to aid in nutritional intake. RD encouraged the impact of healthy eating habits and how to incorporate healthy foods using convenience foods and methods of preparing.   NUTRITION DIAGNOSIS:   Predicted suboptimal nutrient intake related to social / environmental circumstances as evidenced by per patient/family report.  GOAL:   Patient will meet greater than or equal to 90% of their needs  MONITOR:   PO intake, Labs, Weight trends, I & O's  REASON FOR ASSESSMENT:   Malnutrition Screening Tool    ASSESSMENT:   Pt with known h/o breast cancer was admitted with CP secondary to STEMI, s/p immediate cardiac cath.  Past Medical History  Diagnosis Date  . Hypertension   . Murmur   . Radiation 2010    with chemo  . Arthritis   . Lymphedema   . Chronic kidney disease 2014  . Cancer ([place]) 2010    T1c,N2a, M0; ER 90%, PR 60%,  HER-2/neu 2+ IHC, equivocal on fish. 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  Margins clear.  . Breast cancer ([place])     Adjuvant chemotherapy with Taxotere/Carboplatin/Herceptin. Arimidex initiated November 2011  . Squamous cell carcinoma [facility]) June 2016    Left upper inner chest wall, area of previous radiation.    Diet Order:  Diet heart healthy/carb modified Room service appropriate?: Yes; Fluid consistency:: Thin   Pt reports eating 100% of breakfast this morning.   Pt reports appetite was doing ok PTA but was not eating healthy foods.  Pt reports eating oatmeal for breakfast, a sandwich or popcorn for lunch and dinner might be a frozen meal or she might go to the cafeteria.  Pt reports since  her husband died she no longer wants to cook.    Medications: Calcium Citrate-Vitamin D,MVI, Protonix Labs:  Reviewed, BUN 21, Cre 1.54, glucose 111   Gastrointestinal Profile: Last BM:  06/07/2016   Nutrition-Focused Physical Exam Findings: Nutrition-Focused physical exam completed. Findings are WDL for fat depletion, muscle depletion, and edema.    Weight Change: Per [facility] weight trends, pt weight stable. Pt reports weight has been stable between 180-190lbs.   Skin:  Reviewed, no issues   Height:   Ht Readings from Last 1 Encounters:  06/08/16 _0  (1.651 m)    Weight:   Wt Readings from Last 1 Encounters:  06/08/16 187 lb 6.3 oz (85 kg)   Wt Readings from Last 10 Encounters:  06/08/16 187 lb 6.3 oz (85 kg)  05/16/16 188 lb (85.276 kg)  03/27/16 185 lb (83.915 kg)  12/07/15 183 lb (83.008 kg)  10/17/15 186 lb (84.369 kg)  09/02/15 185 lb 10 oz (84.2 kg)  08/22/15 184 lb 6.4 oz (83.643 kg)  08/09/15 182 lb (82.555 kg)  07/25/15 183 lb (83.008 kg)  05/30/15 185 lb (83.915 kg)    Ideal Body Weight:   56.8kg  BMI:  Body mass index is 31.18 kg/(m^2).  Estimated Nutritional Needs:   Kcal:  1880-2230kcals  Protein:  94-110g proteni  Fluid:  >/= 2L fluid  EDUCATION NEEDS:   Education needs addressed  Dwyane Luo, RD, LDN Pager [phone] Weekend/On-Call Pager [phone]

## 2016-06-10 MED ORDER — ATORVASTATIN CALCIUM 40 MG PO TABS: 40.0000 mg | ORAL_TABLET | Freq: Every day | ORAL | Status: DC

## 2016-06-10 MED ORDER — HYDROCHLOROTHIAZIDE 25 MG PO TABS: 12.5000 mg | ORAL_TABLET | Freq: Every day | ORAL | Status: DC

## 2016-06-10 MED ORDER — ASPIRIN 81 MG PO CHEW: 81.0000 mg | CHEWABLE_TABLET | Freq: Every day | ORAL | Status: AC

## 2016-06-10 MED ORDER — TICAGRELOR 90 MG PO TABS: 90.0000 mg | ORAL_TABLET | Freq: Two times a day (BID) | ORAL | Status: DC

## 2016-06-10 NOTE — Progress Notes (Signed)
A & O. Room air. NSR. Takes meds ok. Ambulated around the nurses station and tolerated it well. No pain. BP taken on L leg. IV and tele remvoed. Discharge isntructions given to pt. Prescriptions given to pt. Pt has no further concerns at this time.

## 2016-06-10 NOTE — Progress Notes (Signed)
Subjective:  Patient feels okay status post PCI and stent yesterday. Patient scheduled ICU will transfer from the unit  Objective:  Vital Signs in the last 24 hours: Temp:  [97.7 F (36.5 C)-98.1 F (36.7 C)] 98.1 F (36.7 C) (06/18 1121) Pulse Rate:  [60-64] 62 (06/18 1121) Resp:  [15-23] 22 (06/18 1121) BP: (124-150)/(46-69) 146/52 mmHg (06/18 1121) SpO2:  [95 %-100 %] 95 % (06/18 1121) Weight:  [84.55 kg (186 lb 6.4 oz)] 84.55 kg (186 lb 6.4 oz) (06/17 1719)  Intake/Output from previous day: 06/17 0701 - 06/18 0700 In: -  Out: 2300 [Urine:2300] Intake/Output from this shift: Total I/O In: 240 [P.O.:240] Out: 350 [Urine:350]  Physical Exam: General appearance: alert, cooperative and appears stated age Neck: no adenopathy, no carotid bruit, no JVD, supple, symmetrical, trachea midline and thyroid not enlarged, symmetric, no tenderness/mass/nodules Lungs: clear to auscultation bilaterally Heart: regular rate and rhythm, S1, S2 normal, no murmur, click, rub or gallop Abdomen: soft, non-tender; bowel sounds normal; no masses,  no organomegaly Extremities: extremities normal, atraumatic, no cyanosis or edema Pulses: 2+ and symmetric Skin: Skin color, texture, turgor normal. No rashes or lesions Neurologic: Alert and oriented X 3, normal strength and tone. Normal symmetric reflexes. Normal coordination and gait  Lab Results:  Recent Labs  06/08/16 1336  WBC 7.1  HGB 12.6  PLT 137*    Recent Labs  06/08/16 1336 06/09/16 0306  NA 136 138  K 5.0 3.9  CL 103 105  CO2 25 26  GLUCOSE 115* 111*  BUN 25* 21*  CREATININE 1.56* 1.54*    Recent Labs  06/08/16 1336 06/09/16 0306  TROPONINI 1.66* 9.42*   Hepatic Function Panel No results for input(s): PROT, ALBUMIN, AST, ALT, ALKPHOS, BILITOT, BILIDIR, IBILI in the last 72 hours.  Recent Labs  06/08/16 1336  CHOL 135   No results for input(s): PROTIME in the last 72 hours.  Imaging: Imaging results have been  reviewed  Cardiac Studies:  Assessment/Plan:  STEMI inferior wall Angina Chest Pain Coronary Artery Disease Coronary Artery Stent Ischemic Heart Disease MI Shortness of Breath  Hypertension History of breast cancer Chronic renal insufficiency stage 3 Status post myocardial infarction Obesity . PLAN Status post PCI and stent STEMI yesterday done by Dr.Arida Continue Brilinta and aspirin POD x 1 Continue Lipitor therapy for lipid management Hypertension control with Avapro HCTZ Continue GERD therapy with Protonix Increase activity ambulate in halls Continue to follow up troponin Have the patient follow-up with nephrology for chronic renal insufficiency Transfer the patient to telemetry Anticipate discharge tomorrow    LOS: 2 days    CALLWOOD,DWAYNE D. 06/10/2016, 11:37 AM

## 2016-06-10 NOTE — Discharge Summary (Addendum)
[place] [facility] [place] NAME: Hailey Ellison    MR#:  [id number]  DATE OF BIRTH:  08/05/1935  DATE OF ADMISSION:  06/08/2016 ADMITTING PHYSICIAN: Wellington Hampshire, MD  DATE OF DISCHARGE: 06/10/2016 PRIMARY CARE PHYSICIAN: [name], MD    ADMISSION DIAGNOSIS:  STEMI STEMI   DISCHARGE DIAGNOSIS:   ST elevation MI SECONDARY DIAGNOSIS:   Past Medical History  Diagnosis Date  . Hypertension   . Murmur   . Radiation 2010    with chemo  . Arthritis   . Lymphedema   . Chronic kidney disease 2014  . Cancer ([place]) 2010    T1c,N2a, M0; ER 90%, PR 60%,  HER-2/neu 2+ IHC, equivocal on fish. 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  Margins clear.  . Breast cancer ([place])     Adjuvant chemotherapy with Taxotere/Carboplatin/Herceptin. Arimidex initiated November 2011  . Squamous cell carcinoma [facility]) June 2016    Left upper inner chest wall, area of previous radiation.    HOSPITAL COURSE:   81 year old female with a history of breast cancer in remission, essential hypertension and chronic kidney disease stage III who presented with chest pain and found to have ST elevation MI.  1. ST elevation MI: Patient underwent cardiac catheterization and has drug-eluting stent in RCA. Continue aspirin, atorvastatin, Avapro and Brillanta. Hold coreg due to bradycardia per Dr. Clayborn Bigness.  2. Essential hypertension: Continue HCTZ, Avapro.  3. Hyperlipidemia: Continue atorvastatin.  4. History of breast cancer in remission: Continue ARIMEDEX.  5. GERD: Continue PPI  6. Chronic kidney disease stage III: stable  I discussed with Dr. Clayborn Bigness, who suggested that the patient can be discharged today and follow-up with Dr. Nehemiah Massed in 2 weeks. DISCHARGE CONDITIONS:   Stable, discharge to home today.  CONSULTS OBTAINED:   Treatment Team:  Yolonda Kida, MD  DRUG ALLERGIES:   Allergies  Allergen Reactions  . Benadryl [Diphenhydramine Hcl] Hives  . Tape   . Tegaderm Ag Mesh [Silver]     tegaderm  . Neosporin [Neomycin-Bacitracin Zn-Polymyx] Rash    DISCHARGE MEDICATIONS:   Current Discharge Medication List    START taking these medications   Details  aspirin 81 MG chewable tablet Chew 1 tablet (81 mg total) by mouth daily. Qty: 30 tablet, Refills: 2    ticagrelor (BRILINTA) 90 MG TABS tablet Take 1 tablet (90 mg total) by mouth 2 (two) times daily. Qty: 60 tablet, Refills: 0      CONTINUE these medications which have CHANGED   Details  atorvastatin (LIPITOR) 40 MG tablet Take 1 tablet (40 mg total) by mouth at bedtime. Qty: 30 tablet, Refills: 2    hydrochlorothiazide (HYDRODIURIL) 25 MG tablet Take 0.5 tablets (12.5 mg total) by mouth daily.      CONTINUE these medications which have NOT CHANGED   Details  anastrozole (ARIMIDEX) 1 MG tablet Take 1 mg by mouth daily.    calcium citrate-vitamin D (CITRACAL+D) 315-200 MG-UNIT per tablet Take 1 tablet by mouth 2 (two) times daily.    dipyridamole-aspirin (AGGRENOX) 200-25 MG per 12 hr capsule Take 1 capsule by mouth 2 (two) times daily.    furosemide (LASIX) 20 MG tablet Take 20 mg by mouth daily as needed for fluid.     glucosamine-chondroitin 500-400 MG tablet Take 2 tablets by mouth daily.    lansoprazole (PREVACID) 30 MG  capsule Take 30 mg by mouth daily.    Multiple Vitamin (MULTIVITAMIN WITH MINERALS) TABS Take 1 tablet by mouth daily.    PARoxetine (PAXIL) 40 MG tablet Take 40 mg by mouth daily.    valsartan (DIOVAN) 320 MG tablet Take 320 mg by mouth daily.    verapamil (CALAN) 120 MG tablet Take 60 mg by mouth daily.         DISCHARGE INSTRUCTIONS:    If you experience worsening of your admission symptoms, develop shortness of breath, life threatening emergency, suicidal or homicidal thoughts you must seek  medical attention immediately by calling 911 or calling your MD immediately  if symptoms less severe.  You Must read complete instructions/literature along with all the possible adverse reactions/side effects for all the Medicines you take and that have been prescribed to you. Take any new Medicines after you have completely understood and accept all the possible adverse reactions/side effects.   Please note  You were cared for by a hospitalist during your hospital stay. If you have any questions about your discharge medications or the care you received while you were in the hospital after you are discharged, you can call the unit and asked to speak with the hospitalist on call if the hospitalist that took care of you is not available. Once you are discharged, your primary care physician will handle any further medical issues. Please note that NO REFILLS for any discharge medications will be authorized once you are discharged, as it is imperative that you return to your primary care physician (or establish a relationship with a primary care physician if you do not have one) for your aftercare needs so that they can reassess your need for medications and monitor your lab values.    Today   SUBJECTIVE    No complaint.  VITAL SIGNS:  Blood pressure 146/52, pulse 62, temperature 98.1 F (36.7 C), temperature source Oral, resp. rate 22, height _0  (1.651 m), weight 186 lb 6.4 oz (84.55 kg), SpO2 95 %.  I/O:   Intake/Output Summary (Last 24 hours) at 06/10/16 1238 Last data filed at 06/10/16 0941  Gross per 24 hour  Intake    240 ml  Output   2100 ml  Net  -1860 ml    PHYSICAL EXAMINATION:  GENERAL:  81 y.o.-year-old patient lying in the bed with no acute distress.  EYES: Pupils equal, round, reactive to light and accommodation. No scleral icterus. Extraocular muscles intact.  HEENT: Head atraumatic, normocephalic. Oropharynx and nasopharynx clear.  NECK:  Supple, no jugular venous  distention. No thyroid enlargement, no tenderness.  LUNGS: Normal breath sounds bilaterally, no wheezing, rales,rhonchi or crepitation. No use of accessory muscles of respiration.  CARDIOVASCULAR: S1, S2 normal. 3/6 systolic murmurs, no rubs, or gallops.  ABDOMEN: Soft, non-tender, non-distended. Bowel sounds present. No organomegaly or mass.  EXTREMITIES: No pedal edema, cyanosis, or clubbing.  NEUROLOGIC: Cranial nerves II through XII are intact. Muscle strength 5/5 in all extremities. Sensation intact. Gait not checked.  PSYCHIATRIC: The patient is alert and oriented x 3.  SKIN: No obvious rash, lesion, or ulcer.   DATA REVIEW:   CBC  Recent Labs Lab 06/08/16 1336  WBC 7.1  HGB 12.6  HCT 35.6  PLT 137*    Chemistries   Recent Labs Lab 06/09/16 0306  NA 138  K 3.9  CL 105  CO2 26  GLUCOSE 111*  BUN 21*  CREATININE 1.54*  CALCIUM 8.9    Cardiac Enzymes  Recent Labs Lab 06/09/16 0306  TROPONINI 9.42*    Microbiology Results  Results for orders placed or performed during the hospital encounter of 06/08/16  Surgical pcr screen     Status: None   Collection Time: 06/08/16  1:21 PM  Result Value Ref Range Status   MRSA, PCR NEGATIVE NEGATIVE Final   Staphylococcus aureus NEGATIVE NEGATIVE Final    Comment:        The Xpert SA Assay (FDA approved for NASAL specimens in patients over 61 years of age), is one component of a comprehensive surveillance program.  Test performance has been validated by [facility] for patients greater than or equal to 87 year old. It is not intended to diagnose infection nor to guide or monitor treatment.   Aerobic Culture (superficial specimen)     Status: None (Preliminary result)   Collection Time: 06/08/16  9:04 PM  Result Value Ref Range Status   Specimen Description BREAST LEFT  Final   Special Requests NONE  Final   Gram Stain   Final    RARE WBC PRESENT, PREDOMINANTLY PMN FEW SQUAMOUS EPITHELIAL CELLS  PRESENT RARE GRAM POSITIVE COCCI RARE GRAM POSITIVE RODS    Culture   Final    CULTURE REINCUBATED FOR BETTER GROWTH Performed at [facility]    Report Status PENDING  Incomplete    RADIOLOGY:  No results found.      Management plans discussed with the patient, family and they are in agreement.  CODE STATUS:     Code Status Orders        Start     Ordered   06/08/16 1317  Full code   Continuous     06/08/16 1316    Code Status History    Date Active Date Inactive Code Status Order ID Comments User Context   This patient has a current code status but no historical code status.      TOTAL TIME TAKING CARE OF THIS PATIENT: 35 minutes.    Demetrios Loll M.D on 06/10/2016 at 12:38 PM  Between 7am to 6pm - Pager - [phone]  After 6pm go to [email or web address] - password [email or web address] [facility]  Office  [phone]  CC: Primary care physician; [name], MD

## 2016-06-10 NOTE — Discharge Instructions (Signed)
Heart healthy diet. °Activity as tolerated. °

## 2016-06-10 NOTE — Progress Notes (Signed)
Subjective:  Patient denies chest pain no bleeding no shortness of breath feels reasonably well ready to go home  Objective:  Vital Signs in the last 24 hours: Temp:  [97.7 F (36.5 C)-98.1 F (36.7 C)] 98.1 F (36.7 C) (06/18 1121) Pulse Rate:  [60-64] 62 (06/18 1121) Resp:  [15-23] 22 (06/18 1121) BP: (124-150)/(46-69) 146/52 mmHg (06/18 1121) SpO2:  [95 %-100 %] 95 % (06/18 1121) Weight:  [84.55 kg (186 lb 6.4 oz)] 84.55 kg (186 lb 6.4 oz) (06/17 1719)  Intake/Output from previous day: 06/17 0701 - 06/18 0700 In: -  Out: 2300 [Urine:2300] Intake/Output from this shift: Total I/O In: 240 [P.O.:240] Out: 350 [Urine:350]  Physical Exam: General appearance: alert, cooperative and appears stated age Neck: no adenopathy, no carotid bruit, no JVD, supple, symmetrical, trachea midline and thyroid not enlarged, symmetric, no tenderness/mass/nodules Lungs: clear to auscultation bilaterally Heart: regular rate and rhythm, S1, S2 normal, no murmur, click, rub or gallop Abdomen: soft, non-tender; bowel sounds normal; no masses,  no organomegaly Extremities: extremities normal, atraumatic, no cyanosis or edema Pulses: 2+ and symmetric Skin: Skin color, texture, turgor normal. No rashes or lesions Neurologic: Alert and oriented X 3, normal strength and tone. Normal symmetric reflexes. Normal coordination and gait  Lab Results:  Recent Labs  06/08/16 1336  WBC 7.1  HGB 12.6  PLT 137*    Recent Labs  06/08/16 1336 06/09/16 0306  NA 136 138  K 5.0 3.9  CL 103 105  CO2 25 26  GLUCOSE 115* 111*  BUN 25* 21*  CREATININE 1.56* 1.54*    Recent Labs  06/08/16 1336 06/09/16 0306  TROPONINI 1.66* 9.42*   Hepatic Function Panel No results for input(s): PROT, ALBUMIN, AST, ALT, ALKPHOS, BILITOT, BILIDIR, IBILI in the last 72 hours.  Recent Labs  06/08/16 1336  CHOL 135   No results for input(s): PROTIME in the last 72 hours.  Imaging: Imaging results have been  reviewed  Cardiac Studies:  Assessment/Plan:  STEMI inferior status post PCI and stent DES to RCA Angina Chest Pain Coronary Artery Disease Coronary Artery Stent Ischemic Heart Disease MI Shortness of Breath  GERD Hyperlipidemia Obesity . PLAN Continue Brilinta status post stent Aspirin 81 mg once a day Increase activity ambulate normal Continue Lipitor for lipid management Hypertension control with HCTZ and Avapro consider beta blocker GERD therapy with Protonix Have the patient follow-up with Dr. Nehemiah Massed 2-4 weeks   LOS: 2 days    CALLWOOD,DWAYNE D. 06/10/2016, 11:31 AM

## 2016-06-11 ENCOUNTER — Ambulatory Visit (INDEPENDENT_AMBULATORY_CARE_PROVIDER_SITE_OTHER): Payer: Medicare Other | Admitting: Podiatry

## 2016-06-11 DIAGNOSIS — B351 Tinea unguium: Secondary | ICD-10-CM

## 2016-06-11 DIAGNOSIS — M79676 Pain in unspecified toe(s): Secondary | ICD-10-CM | POA: Diagnosis not present

## 2016-06-11 LAB — AEROBIC CULTURE  (SUPERFICIAL SPECIMEN)

## 2016-06-11 NOTE — Progress Notes (Signed)
She presents today chief complaint painful elongated toenails 1 through 5 bilateral.  Objective: Pulses are palpable bilateral. Toenails are thick yellow dystrophic onychomycotic and painful palpation.  Assessment: Pain limb secondary to onychomycosis.  Plan: Debridement of toenails 1 through 5 bilateral covered service secondary to pain.

## 2016-06-12 ENCOUNTER — Ambulatory Visit: Payer: Medicare Other | Admitting: Occupational Therapy

## 2016-06-19 ENCOUNTER — Encounter: Payer: Medicare Other | Attending: Internal Medicine | Admitting: Internal Medicine

## 2016-06-19 DIAGNOSIS — M1990 Unspecified osteoarthritis, unspecified site: Secondary | ICD-10-CM | POA: Insufficient documentation

## 2016-06-19 DIAGNOSIS — I252 Old myocardial infarction: Secondary | ICD-10-CM | POA: Insufficient documentation

## 2016-06-19 DIAGNOSIS — N641 Fat necrosis of breast: Secondary | ICD-10-CM | POA: Insufficient documentation

## 2016-06-19 DIAGNOSIS — Z955 Presence of coronary angioplasty implant and graft: Secondary | ICD-10-CM | POA: Diagnosis not present

## 2016-06-19 DIAGNOSIS — C50412 Malignant neoplasm of upper-outer quadrant of left female breast: Secondary | ICD-10-CM | POA: Insufficient documentation

## 2016-06-19 DIAGNOSIS — I2510 Atherosclerotic heart disease of native coronary artery without angina pectoris: Secondary | ICD-10-CM | POA: Insufficient documentation

## 2016-06-19 DIAGNOSIS — Z8673 Personal history of transient ischemic attack (TIA), and cerebral infarction without residual deficits: Secondary | ICD-10-CM | POA: Insufficient documentation

## 2016-06-19 DIAGNOSIS — N189 Chronic kidney disease, unspecified: Secondary | ICD-10-CM | POA: Diagnosis not present

## 2016-06-19 DIAGNOSIS — S21002D Unspecified open wound of left breast, subsequent encounter: Secondary | ICD-10-CM | POA: Insufficient documentation

## 2016-06-19 DIAGNOSIS — I129 Hypertensive chronic kidney disease with stage 1 through stage 4 chronic kidney disease, or unspecified chronic kidney disease: Secondary | ICD-10-CM | POA: Diagnosis not present

## 2016-06-19 DIAGNOSIS — X58XXXD Exposure to other specified factors, subsequent encounter: Secondary | ICD-10-CM | POA: Diagnosis not present

## 2016-06-20 ENCOUNTER — Other Ambulatory Visit
Admission: RE | Admit: 2016-06-20 | Discharge: 2016-06-20 | Disposition: A | Discharge status: Home / Self Care | Payer: Medicare Other | Source: Other Acute Inpatient Hospital | Attending: Internal Medicine | Admitting: Internal Medicine

## 2016-06-20 DIAGNOSIS — B958 Unspecified staphylococcus as the cause of diseases classified elsewhere: Secondary | ICD-10-CM | POA: Insufficient documentation

## 2016-06-20 DIAGNOSIS — B999 Unspecified infectious disease: Secondary | ICD-10-CM | POA: Diagnosis present

## 2016-06-20 NOTE — Progress Notes (Addendum)
Hailey Ellison ([id number]) Visit Report for 06/19/2016 Allergy List Details Patient Name: Hailey Ellison, Hailey Ellison. Date of Service: 06/19/2016 3:00 PM Medical Record Patient Account Number: [id number] Number: Treating RN: Cornell Barman April 16, 1935 (81 y.o. Other Clinician: Date of Birth/Sex: Female) Treating ROBSON, MICHAEL Primary Care Physician/Extender: Jeryl Columbia Physician: Referring Physician: Etta Quill Weeks in Treatment: 0 Allergies Active Allergies alendronate sodium Benadryl latex adhesive Neosporin (neo-bac-polym) Allergy Notes Electronic Signature(s) Signed: 06/19/2016 4:48:44 PM By: Gretta Cool, RN, BSN, Kim RN, BSN Entered By: Gretta Cool, RN, BSN, Kim on 06/19/2016 15:13:23 NAZIA, HAZE ([id number]) -------------------------------------------------------------------------------- Arrival Information Details Patient Name: Hailey Ellison. Date of Service: 06/19/2016 3:00 PM Medical Record Patient Account Number: [id number] Number: Treating RN: Cornell Barman May 28, 1935 (81 y.o. Other Clinician: Date of Birth/Sex: Female) Treating ROBSON, MICHAEL Primary Care Physician/Extender: Jeryl Columbia Physician: Referring Physician: Luiz Ochoa in Treatment: 0 Visit Information Patient Arrived: Ambulatory Arrival Time: 15:04 Accompanied By: self Transfer Assistance: None Patient Identification Verified: Yes Secondary Verification Process Yes Completed: Patient Requires Transmission- No Based Precautions: Patient Has Alerts: Yes Patient Alerts: Patient on Blood Thinner Aspirin, Brilinta Electronic Signature(s) Signed: 06/19/2016 4:48:44 PM By: Gretta Cool, RN, BSN, Kim RN, BSN Entered By: Gretta Cool, RN, BSN, Kim on 06/19/2016 15:11:08 [name] ([id number]) -------------------------------------------------------------------------------- Clinic Level of Care Assessment Details Patient Name: Hailey Ellison. Date of Service: 06/19/2016  3:00 PM Medical Record Patient Account Number: [id number] Number: Treating RN: Cornell Barman 1935/12/24 (81 y.o. Other Clinician: Date of Birth/Sex: Female) Treating ROBSON, MICHAEL Primary Care Physician/Extender: Jeryl Columbia Physician: Referring Physician: Luiz Ochoa in Treatment: 0 Clinic Level of Care Assessment Items TOOL 2 Quantity Score []  - Use when only an EandM is performed on the INITIAL visit 0 ASSESSMENTS - Nursing Assessment / Reassessment X - General Physical Exam (combine w/ comprehensive assessment (listed just 1 20 below) when performed on new pt. evals) []  - Comprehensive Assessment (HX, ROS, Risk Assessments, Wounds Hx, etc.) 0 ASSESSMENTS - Wound and Skin Assessment / Reassessment X - Simple Wound Assessment / Reassessment - one wound 1 5 []  - Complex Wound Assessment / Reassessment - multiple wounds 0 []  - Dermatologic / Skin Assessment (not related to wound area) 0 ASSESSMENTS - Ostomy and/or Continence Assessment and Care []  - Incontinence Assessment and Management 0 []  - Ostomy Care Assessment and Management (repouching, etc.) 0 PROCESS - Coordination of Care X - Simple Patient / Family Education for ongoing care 1 15 []  - Complex (extensive) Patient / Family Education for ongoing care 0 []  - Staff obtains Programmer, systems, Records, Test Results / Process Orders 0 []  - Staff telephones HHA, Nursing Homes / Clarify orders / etc 0 []  - Routine Transfer to another Facility (non-emergent condition) 0 []  - Routine Hospital Admission (non-emergent condition) 0 X - New Admissions / Biomedical engineer / Ordering NPWT, Apligraf, etc. 1 15 NGAIRE, GAMBLE Ellison. ([id number]) []  - Emergency Hospital Admission (emergent condition) 0 X - Simple Discharge Coordination 1 10 []  - Complex (extensive) Discharge Coordination 0 PROCESS - Special Needs []  - Pediatric / Minor Patient Management 0 []  - Isolation Patient Management 0 []  - Hearing /  Language / Visual special needs 0 []  - Assessment of Community assistance (transportation, D/C planning, etc.) 0 []  - Additional assistance / Altered mentation 0 []  - Support Surface(s) Assessment (bed, cushion, seat, etc.) 0 INTERVENTIONS - Wound Cleansing / Measurement X - Wound Imaging (photographs - any number of wounds) 1 5 []  - Wound Tracing (  instead of photographs) 0 X - Simple Wound Measurement - one wound 1 5 []  - Complex Wound Measurement - multiple wounds 0 X - Simple Wound Cleansing - one wound 1 5 []  - Complex Wound Cleansing - multiple wounds 0 INTERVENTIONS - Wound Dressings X - Small Wound Dressing one or multiple wounds 1 10 []  - Medium Wound Dressing one or multiple wounds 0 []  - Large Wound Dressing one or multiple wounds 0 []  - Application of Medications - injection 0 INTERVENTIONS - Miscellaneous []  - External ear exam 0 X - Specimen Collection (cultures, biopsies, blood, body fluids, etc.) 1 5 []  - Specimen(s) / Culture(s) sent or taken to Lab for analysis 0 []  - Patient Transfer (multiple staff / Civil Service fast streamer / Similar devices) 0 CONCETTINA, KRUPNICK Ellison. ([id number]) []  - Simple Staple / Suture removal (25 or less) 0 []  - Complex Staple / Suture removal (26 or more) 0 []  - Hypo / Hyperglycemic Management (close monitor of Blood Glucose) 0 X - Ankle / Brachial Index (ABI) - do not check if billed separately 1 15 Has the patient been seen at the hospital within the last three years: Yes Total Score: 110 Level Of Care: New/Established - Level 3 Electronic Signature(s) Signed: 06/19/2016 4:48:44 PM By: Gretta Cool, RN, BSN, Kim RN, BSN Entered By: Gretta Cool, RN, BSN, Kim on 06/19/2016 16:05:55 [name] ([id number]) -------------------------------------------------------------------------------- Encounter Discharge Information Details Patient Name: Hailey Ellison. Date of Service: 06/19/2016 3:00 PM Medical Record Patient Account Number: [id number] Number: Treating  RN: Cornell Barman 1935/11/10 (81 y.o. Other Clinician: Date of Birth/Sex: Female) Treating ROBSON, MICHAEL Primary Care Physician/Extender: Jeryl Columbia Physician: Referring Physician: Luiz Ochoa in Treatment: 0 Encounter Discharge Information Items Discharge Pain Level: 0 Discharge Condition: Stable Ambulatory Status: Ambulatory Discharge Destination: Home Transportation: Private Auto Accompanied By: self Schedule Follow-up Appointment: Yes Medication Reconciliation completed and provided to Patient/Care Yes Jillyan Plitt: Provided on Clinical Summary of Care: 06/19/2016 Form Type Recipient Paper Patient SK Electronic Signature(s) Signed: 06/19/2016 4:48:44 PM By: Gretta Cool RN, BSN, Kim RN, BSN Previous Signature: 06/19/2016 4:03:16 PM Version By: Ruthine Dose Entered By: Gretta Cool RN, BSN, Kim on 06/19/2016 16:06:50 [name] ([id number]) -------------------------------------------------------------------------------- Multi Wound Chart Details Patient Name: TISHAWNA, KIMBRELL Ellison. Date of Service: 06/19/2016 3:00 PM Medical Record Patient Account Number: [id number] Number: Treating RN: Cornell Barman [birth year] (80 y.o. Other Clinician: Date of Birth/Sex: Female) Treating ROBSON, MICHAEL Primary Care Physician/Extender: Jeryl Columbia Physician: Referring Physician: Etta Quill Weeks in Treatment: 0 Vital Signs Height(in): 65 Pulse(bpm): 71 Weight(lbs): 184 Blood Pressure 125/71 (mmHg): Body Mass Index(BMI): 31 Temperature(F): 98.30 Respiratory Rate 18 (breaths/min): Photos: [1:No Photos] [N/A:N/A] Wound Location: [1:Left Breast] [N/A:N/A] Wounding Event: [1:Surgical Injury] [N/A:N/A] Primary Etiology: [1:Abscess] [N/A:N/A] Comorbid History: [1:Coronary Artery Disease, Hypertension, Myocardial Infarction, Osteoarthritis, Received Chemotherapy, Received Radiation] [N/A:N/A] Date Acquired: [1:05/25/2015] [N/A:N/A] Weeks of Treatment:  [1:0] [N/A:N/A] Wound Status: [1:Open] [N/A:N/A] Measurements L x W x D 0.1x0.1x0.1 [N/A:N/A] (cm) Area (cm) : [1:0.008] [N/A:N/A] Volume (cm) : [1:0.001] [N/A:N/A] % Reduction in Area: [1:0.00%] [N/A:N/A] % Reduction in Volume: 0.00% [N/A:N/A] Classification: [1:Partial Thickness] [N/A:N/A] Exudate Amount: [1:Medium] [N/A:N/A] Exudate Type: [1:Serous] [N/A:N/A] Exudate Color: [1:amber] [N/A:N/A] Wound Margin: [1:Indistinct, nonvisible] [N/A:N/A] Exposed Structures: [1:Fascia: No Fat: No Tendon: No Muscle: No] [N/A:N/A] Joint: No Bone: No Limited to Skin Breakdown Epithelialization: None N/A N/A Periwound Skin Texture: Edema: No N/A N/A Excoriation: No Induration: No Callus: No Crepitus: No Fluctuance: No Friable: No Rash: No Scarring: No Periwound Skin Moist:  Yes N/A N/A Moisture: Maceration: No Dry/Scaly: No Periwound Skin Color: Atrophie Blanche: No N/A N/A Cyanosis: No Ecchymosis: No Erythema: No Hemosiderin Staining: No Mottled: No Pallor: No Rubor: No Tenderness on No N/A N/A Palpation: Wound Preparation: Ulcer Cleansing: N/A N/A Rinsed/Irrigated with Saline Treatment Notes Electronic Signature(s) Signed: 06/19/2016 4:48:44 PM By: Gretta Cool, RN, BSN, Kim RN, BSN Entered By: Gretta Cool, RN, BSN, Kim on 06/19/2016 15:56:27 [name] ([id number]) -------------------------------------------------------------------------------- [place] Details Patient Name: VLADA, NAKAMOTO. Date of Service: 06/19/2016 3:00 PM Medical Record Patient Account Number: [id number] Number: Treating RN: Cornell Barman 23-Jul-1935 (80 y.o. Other Clinician: Date of Birth/Sex: Female) Treating ROBSON, MICHAEL Primary Care Physician/Extender: Jeryl Columbia Physician: Referring Physician: Luiz Ochoa in Treatment: 0 Active Inactive Abuse / Safety / Falls / Self Care Management Nursing Diagnoses: Potential for falls Goals: Patient  will remain injury free Date Initiated: 06/19/2016 Goal Status: Active Interventions: Assess fall risk on admission and as needed Notes: Nutrition Nursing Diagnoses: Imbalanced nutrition Goals: Patient/caregiver agrees to and verbalizes understanding of need to use nutritional supplements and/or vitamins as prescribed Date Initiated: 06/19/2016 Goal Status: Active Interventions: Provide education on nutrition Notes: Orientation to the Wound Care Program Nursing Diagnoses: Knowledge deficit related to the wound healing center program IYANUOLUWA, ROIGER ([id number]) Goals: Patient/caregiver will verbalize understanding of the [place] Date Initiated: 06/19/2016 Goal Status: Active Interventions: Provide education on orientation to the wound center Notes: Wound/Skin Impairment Nursing Diagnoses: Impaired tissue integrity Goals: Ulcer/skin breakdown will heal within 14 weeks Date Initiated: 06/19/2016 Goal Status: Active Interventions: Assess patient/caregiver ability to obtain necessary supplies Notes: Electronic Signature(s) Signed: 06/19/2016 4:48:44 PM By: Gretta Cool, RN, BSN, Kim RN, BSN Entered By: Gretta Cool, RN, BSN, Kim on 06/19/2016 15:49:14 [name] ([id number]) -------------------------------------------------------------------------------- Pain Assessment Details Patient Name: ROXSANA, MCCLOSKY Ellison. Date of Service: 06/19/2016 3:00 PM Medical Record Patient Account Number: [id number] Number: Treating RN: Cornell Barman July 07, 1935 (80 y.o. Other Clinician: Date of Birth/Sex: Female) Treating ROBSON, MICHAEL Primary Care Physician/Extender: Jeryl Columbia Physician: Referring Physician: Luiz Ochoa in Treatment: 0 Active Problems Location of Pain Severity and Description of Pain Patient Has Paino No Site Locations With Dressing Change: No Pain Management and Medication Current Pain Management: Electronic  Signature(s) Signed: 06/19/2016 4:48:44 PM By: Gretta Cool, RN, BSN, Kim RN, BSN Entered By: Gretta Cool, RN, BSN, Kim on 06/19/2016 15:11:15 [name] ([id number]) -------------------------------------------------------------------------------- Patient/Caregiver Education Details Patient Name: [name]. Date of Service: 06/19/2016 3:00 PM Medical Record Patient Account Number: [id number] Number: Treating RN: Cornell Barman 16-Apr-1935 (80 y.o. Other Clinician: Date of Birth/Gender: Female) Treating ROBSON, MICHAEL Primary Care Physician/Extender: Jeryl Columbia Physician: Suella [place] in Treatment: 0 Referring Physician: Etta Quill Education Assessment Education Provided To: Patient Education Topics Provided Welcome [facility]: Handouts: [facility] , Other: wound care as prescribed Methods: Demonstration Responses: State content correctly Electronic Signature(s) Signed: 06/19/2016 4:48:44 PM By: Gretta Cool, RN, BSN, Kim RN, BSN Entered By: Gretta Cool, RN, BSN, Kim on 06/19/2016 16:07:13 PETREA, CHANDA ([id number]) -------------------------------------------------------------------------------- Wound Assessment Details Patient Name: AJEENAH, BOEHNER. Date of Service: 06/19/2016 3:00 PM Medical Record Patient Account Number: [id number] Number: Treating RN: Cornell Barman Dec 20, 1935 (80 y.o. Other Clinician: Date of Birth/Sex: Female) Treating ROBSON, MICHAEL Primary Care Physician/Extender: Jeryl Columbia Physician: Referring Physician: Etta Quill Weeks in Treatment: 0 Wound Status Wound Number: 1 Primary Abscess Etiology: Wound Location: Left Breast Wound Open Wounding Event: Surgical Injury  Status: Date Acquired: 05/25/2015 Comorbid Coronary Artery Disease, Hypertension, Weeks Of Treatment: 0 History: Myocardial Infarction, Osteoarthritis, Clustered Wound: No Received Chemotherapy, Received Radiation Wound  Measurements Length: (cm) 0.1 Width: (cm) 0.1 Depth: (cm) 0.1 Area: (cm) 0.008 Volume: (cm) 0.001 % Reduction in Area: 0% % Reduction in Volume: 0% Epithelialization: None Wound Description Classification: Partial Thickness Wound Margin: Indistinct, nonvisible Exudate Amount: Medium Exudate Type: Serous Exudate Color: amber Wound Bed Exposed Structure Fascia Exposed: No Fat Layer Exposed: No Tendon Exposed: No Muscle Exposed: No Joint Exposed: No Bone Exposed: No Limited to Skin Breakdown Periwound Skin Texture Texture Color No Abnormalities Noted: No No Abnormalities Noted: No KEYAIRA, BERNABE Ellison. ([id number]) Callus: No Atrophie Blanche: No Crepitus: No Cyanosis: No Excoriation: No Ecchymosis: No Fluctuance: No Erythema: No Friable: No Hemosiderin Staining: No Induration: No Mottled: No Localized Edema: No Pallor: No Rash: No Rubor: No Scarring: No Moisture No Abnormalities Noted: No Dry / Scaly: No Maceration: No Moist: Yes Wound Preparation Ulcer Cleansing: Rinsed/Irrigated with Saline Treatment Notes Wound #1 (Left Breast) 1. Cleansed with: Clean wound with Normal Saline 4. Dressing Applied: Iodosorb Ointment 5. Secondary Dressing Applied Bordered Foam Dressing Electronic Signature(s) Signed: 06/22/2016 4:25:19 PM By: Gretta Cool, RN, BSN, Kim RN, BSN Previous Signature: 06/19/2016 4:48:44 PM Version By: Gretta Cool, RN, BSN, Kim RN, BSN Entered By: Gretta Cool, RN, BSN, Kim on 06/21/2016 16:20:55 EMERSON, CISAR ([id number]) -------------------------------------------------------------------------------- [place] Details Patient Name: RAYNAH, GOLDBERG. Date of Service: 06/19/2016 3:00 PM Medical Record Patient Account Number: [id number] Number: Treating RN: Cornell Barman 26-Jul-1935 (80 y.o. Other Clinician: Date of Birth/Sex: Female) Treating ROBSON, MICHAEL Primary Care Physician/Extender: Jeryl Columbia Physician: Referring Physician: Etta Quill Weeks in Treatment: 0 Vital Signs Time Taken: 15:11 Temperature (F): 98.30 Height (in): 65 Pulse (bpm): 71 Weight (lbs): 184 Respiratory Rate (breaths/min): 18 Body Mass Index (BMI): 30.6 Blood Pressure (mmHg): 125/71 Reference Range: 80 - 120 mg / dl Electronic Signature(s) Signed: 06/19/2016 4:48:44 PM By: Gretta Cool, RN, BSN, Kim RN, BSN Entered By: Gretta Cool, RN, BSN, Kim on 06/19/2016 15:11:48

## 2016-06-20 NOTE — Progress Notes (Signed)
Hailey, Ellison ([id number]) Visit Report for 06/19/2016 Chief Complaint Document Details Patient Name: Hailey Ellison, Hailey Ellison. Date of Service: 06/19/2016 3:00 PM Medical Record Patient Account Number: [id number] Number: Treating RN: Cornell Barman [birth year] (80 y.o. Other Clinician: Date of Birth/Sex: Female) Treating Tiaunna Buford Primary Care Physician/Extender: Jeryl Columbia Physician: Referring Physician: Luiz Ochoa in Treatment: 0 Information Obtained from: Patient Chief Complaint patient is hear for review of a draining sinus in the outer quadrant of her left breast Electronic Signature(s) Signed: 06/20/2016 12:38:30 PM By: Linton Ham MD Entered By: Linton Ham on 06/19/2016 19:26:59 Hailey Ellison ([id number]) -------------------------------------------------------------------------------- HPI Details Patient Name: Hailey Ellison, Hailey Ellison. Date of Service: 06/19/2016 3:00 PM Medical Record Patient Account Number: [id number] Number: Treating RN: Cornell Barman 1935/08/29 (80 y.o. Other Clinician: Date of Birth/Sex: Female) Treating Dayle Mcnerney Primary Care Physician/Extender: Jeryl Columbia Physician: Referring Physician: Etta Quill Weeks in Treatment: 0 History of Present Illness HPI Description: 06/19/16; This is a 81 year old woman who is here for review of a draining sinus of the left breast that she states has been present for over a year.The history she gives is that she had a lumpectomy of her left breast by Dr. Bary Castilla in 2010. She was treated with radiation and chemotherapy. She had multiple axillary nodes positive. At some point after this she was felt to have either re-occurence in the same site. She underwent three core biopsies and further surgery the latter of which finally showed her to be free of cancer. For about the last year she has had a small draining sinus in the left upper outer quadrant. Drains mostly  clear fluid, sometimes looking like puss per the patient. She denies pain, fever or other systemic symptoms. the drainage seems to come and go. She has had mammogram and Breast ultrasounds the latter of which suggested fat necrosis. She has apparently been offered further excision of the area by her surgeon however she is uncertain how to proceed. She has been using gauze over the area. She has tried some form of silver based gel and medihoney neither of which had any effect. The patient also has a history of fibrocystic breast disease. She was recently hospitalized for Acute Coronary Syndrome requiring a stent and a culture was done on 6/16 which showed coagulase negative staph and Diptheroids. According to the patient no antibiotics were given In reviewing [place].The patient's area was actually discovered in May 2016. 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 Her last mammogram on 5/12/17showed only benign surgical and radiation changes. Ultrasound of the left breast on 03/27/16 showed a 1.4x1.7 areas of scarring consistent when fat necrosis. There was apparently some area of palpable cancer 2 cm above this however the ultrasound showed only adipose tissue Electronic Signature(s) Signed: 06/20/2016 12:38:30 PM By: Linton Ham MD Entered By: Linton Ham on 06/19/2016 19:58:31 Hailey Ellison ([id number]) -------------------------------------------------------------------------------- Physical Exam Details Patient Name: Hailey Ellison, Hailey Ellison. Date of Service: 06/19/2016 3:00 PM Medical Record Patient Account Number: [id number] Number: Treating RN: Cornell Barman [birth year] (80 y.o. Other Clinician: Date of Birth/Sex: Female) Treating Arelia Volpe Primary Care Physician/Extender: Jeryl Columbia Physician: Referring Physician: Etta Quill Weeks in Treatment: 0 Constitutional Sitting or standing Blood Pressure is within target range for patient.. Pulse regular and within target range for patient.Marland Kitchen Respirations regular, non-labored and within target range.. Temperature is  normal and within the target range for the patient.. Patient's appearance is neat and clean. Appears in no acute distress. Well nourished and well developed.. Eyes Conjunctivae clear. No discharge.. Neck Neck supple and symmetrical. No masses or crepitus. Marland Kitchen Respiratory Respiratory effort is easy and symmetric bilaterally. Rate is normal at rest and on room air.. Bilateral breath sounds are clear and equal in all lobes with no wheezes, rales or rhonchi.. Cardiovascular 3/6 SEM consistent with AS radiates into left carotid. No signee of chi. Chest The patient has a small opening in the left breast appprox 67mm. I did not attempt to probe this. Previously described as less than 28mm. There is a firmness to the area in the left breast just above the draining site. No tenderness. Gastrointestinal (GI) Abdomen is soft and non-distended without masses or tenderness. Bowel sounds active in all quadrants.. No liver or spleen enlargement or tenderness.. Lymphatic None palpable in the the left axilla clavicular and cervical. Psychiatric No evidence of depression, anxiety, or agitation. Calm, cooperative, and communicative. Appropriate interactions and affect.. Notes Wound exam: again area which is a small draining sinus in t he left upper quadrant. No erythema and no tenderness. There is clear fluid which is expressed with palpation around the area. This does not appear purulent. The area is really to small to measure at this point Electronic Signature(s) Signed: 06/20/2016 12:38:30 PM By: Linton Ham MD Hailey Ellison, Hailey Ellison ([id number]) Entered By: Linton Ham on 06/19/2016 20:04:00 Hailey Ellison  ([id number]) -------------------------------------------------------------------------------- Physician Orders Details Patient Name: Hailey Ellison, Hailey Ellison. Date of Service: 06/19/2016 3:00 PM Medical Record Patient Account Number: [id number] Number: Treating RN: Cornell Barman 1935/11/16 (80 y.o. Other Clinician: Date of Birth/Sex: Female) Treating Kersti Scavone Primary Care Physician/Extender: Jeryl Columbia Physician: Referring Physician: Luiz Ochoa in Treatment: 0 Verbal / Phone Orders: Yes Clinician: Cornell Barman Read Back and Verified: Yes Diagnosis Coding Wound Cleansing Wound #1 Left Breast o Clean wound with Normal Saline. Primary Wound Dressing Wound #1 Left Breast o Iodosorb Ointment Secondary Dressing Wound #1 Left Breast o Boardered Foam Dressing Dressing Change Frequency Wound #1 Left Breast o Change dressing every day. Follow-up Appointments Wound #1 Left Breast o Return Appointment in 1 week. Laboratory o Bacteria identified in Wound by Culture (MICRO) oooo LOINC Code: [phone] oooo Convenience Name: Wound culture routine Electronic Signature(s) Signed: 06/19/2016 4:48:44 PM By: Gretta Cool RN, BSN, Kim RN, BSN Signed: 06/20/2016 12:38:30 PM By: Linton Ham MD Entered By: Gretta Cool, RN, BSN, Kim on 06/19/2016 15:57:57 Hailey Ellison, Hailey Ellison ([id number]) Hailey Ellison, Hailey Ellison ([id number]) -------------------------------------------------------------------------------- Problem List Details Patient Name: Hailey Ellison, Hailey Ellison. Date of Service: 06/19/2016 3:00 PM Medical Record Patient Account Number: [id number] Number: Treating RN: Cornell Barman July 02, 1935 (80 y.o. Other Clinician: Date of Birth/Sex: Female) Treating Danton Palmateer Primary Care Physician/Extender: Jeryl Columbia Physician: Referring Physician: Etta Quill Weeks in Treatment: 0 Active Problems ICD-10 Encounter Code Description Active Date Diagnosis S21.002D  Unspecified open wound of left breast, subsequent 06/19/2016 Yes encounter N64.1 Fat necrosis of breast 06/19/2016 Yes C50.412 Malignant neoplasm of upper-outer quadrant of left female 06/19/2016 Yes breast Inactive Problems Resolved Problems Electronic Signature(s) Signed: 06/20/2016 12:38:30 PM By: Linton Ham MD Previous Signature: 06/19/2016 8:17:48 PM Version By: Linton Ham MD Previous Signature: 06/19/2016 7:25:56 PM Version By: Linton Ham MD Entered By: Linton Ham on 06/20/2016 07:13:36 Hailey Ellison ([id number]) -------------------------------------------------------------------------------- Progress Note Details Patient Name: Hailey Ellison, Hailey Ellison. Date of Service: 06/19/2016 3:00 PM Medical Record Patient Account Number: [id number]  [id number] Number: Treating RN: Cornell Barman April 08, 1935 (80 y.o. Other Clinician: Date of Birth/Sex: Female) Treating Mubashir Mallek Primary Care Physician/Extender: Jeryl Columbia Physician: Referring Physician: Luiz Ochoa in Treatment: 0 Subjective Chief Complaint Information obtained from Patient patient is hear for review of a draining sinus in the outer quadrant of her left breast History of Present Illness (HPI) 06/19/16; This is a 81 year old woman who is here for review of a draining sinus of the left breast that she states has been present for over a year.The history she gives is that she had a lumpectomy of her left breast by Dr. Bary Castilla in 2010. She was treated with radiation and chemotherapy. She had multiple axillary nodes positive. At some point after this she was felt to have either re-occurence in the same site. She underwent three core biopsies and further surgery the latter of which finally showed her to be free of cancer. For about the last year she has had a small draining sinus in the left upper outer quadrant. Drains mostly clear fluid, sometimes looking like puss per the patient. She denies pain,  fever or other systemic symptoms. the drainage seems to come and go. She has had mammogram and Breast ultrasounds the latter of which suggested fat necrosis. She has apparently been offered further excision of the area by her surgeon however she is uncertain how to proceed. She has been using gauze over the area. She has tried some form of silver based gel and medihoney neither of which had any effect. The patient also has a history of fibrocystic breast disease. She was recently hospitalized for Acute Coronary Syndrome requiring a stent and a culture was done on 6/16 which showed coagulase negative staph and Diptheroids. According to the patient no antibiotics were given In reviewing [place].The patient's area was actually discovered in May 2016. 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 Her last mammogram on 5/12/17showed only benign surgical and radiation changes. Ultrasound of the left breast on 03/27/16 showed a 1.4x1.7 areas of scarring consistent when fat necrosis. There was apparently some area of palpable cancer 2 cm above this however the ultrasound showed only adipose tissue Wound History Patient presents with 1 open wound that has been present for approximately 1 YEAR. Patient has been treating wound in the following manner: bandage. Laboratory tests have been performed in the last month. Patient reportedly has not tested positive for an antibiotic resistant organism. Patient reportedly has not tested positive for osteomyelitis. Patient reportedly has not had testing performed to evaluate circulation in the legs. Patient experiences the following problems associated with their wounds: infection. Hailey Ellison, Hailey Ellison ([id number]) Patient History Information obtained from Patient. Allergies alendronate sodium, Benadryl, latex, adhesive, Neosporin  (neo-bac-polym) Family History Cancer - Siblings, Diabetes - Child, Heart Disease - Father, Siblings, Hypertension - Mother, Father, Siblings, Kidney Disease - Child, No family history of Lung Disease, Seizures, Stroke, Thyroid Problems. Social History Never smoker, Marital Status - Widowed, Alcohol Use - Never, Drug Use - No History, Caffeine Use - Moderate. Medical History Eyes Denies history of Cataracts, Glaucoma, Optic Neuritis Ear/Nose/Mouth/Throat Denies history of Chronic sinus problems/congestion, Middle ear problems Hematologic/Lymphatic Denies history of Anemia, Hemophilia, Human Immunodeficiency Virus, Lymphedema, Sickle Cell Disease Respiratory Denies history of Aspiration, Asthma, Chronic Obstructive Pulmonary Disease (COPD), Pneumothorax, Sleep Apnea, Tuberculosis Cardiovascular Patient has history of Coronary Artery Disease - stent (May  16), Hypertension, Myocardial Infarction Denies history of Angina, Arrhythmia, Congestive Heart Failure, Deep Vein Thrombosis, Hypotension, Peripheral Arterial Disease, Peripheral Venous Disease, Phlebitis, Vasculitis Gastrointestinal Denies history of Cirrhosis , Colitis, Crohn s, Hepatitis A, Hepatitis B, Hepatitis C Endocrine Denies history of Type I Diabetes, Type II Diabetes Genitourinary Denies history of End Stage Renal Disease Immunological Denies history of Lupus Erythematosus, Raynaud s, Scleroderma Integumentary (Skin) Denies history of History of Burn, History of pressure wounds Musculoskeletal Patient has history of Osteoarthritis Denies history of Gout, Rheumatoid Arthritis, Osteomyelitis Neurologic Denies history of Dementia, Neuropathy, Quadriplegia, Paraplegia, Seizure Disorder Oncologic Patient has history of Received Chemotherapy, Received Radiation Psychiatric Denies history of Anorexia/bulimia, Confinement Anxiety Hailey Ellison, Hailey Ellison ([id number]) Hospitalization/Surgery History - 05/08/2016, [facility], Heart  attack. Medical And Surgical History Notes Constitutional Symptoms (General Health) Breast Cancer; MI (2 weeks ago); CKD; TIA Genitourinary CKD Review of Systems (ROS) Constitutional Symptoms (General Health) The patient has no complaints or symptoms. Eyes Denies complaints or symptoms of Dry Eyes, Vision Changes, Glasses / Contacts. Ear/Nose/Mouth/Throat Denies complaints or symptoms of Difficult clearing ears, Sinusitis. Hematologic/Lymphatic Denies complaints or symptoms of Bleeding / Clotting Disorders, Human Immunodeficiency Virus. Respiratory Complains or has symptoms of Shortness of Breath. Denies complaints or symptoms of Chronic or frequent coughs. Cardiovascular Complains or has symptoms of LE edema. Denies complaints or symptoms of Chest pain. Gastrointestinal The patient has no complaints or symptoms. Endocrine The patient has no complaints or symptoms. Genitourinary The patient has no complaints or symptoms. Immunological The patient has no complaints or symptoms. Integumentary (Skin) Complains or has symptoms of Wounds. Denies complaints or symptoms of Bleeding or bruising tendency, Breakdown, Swelling. Musculoskeletal The patient has no complaints or symptoms. Neurologic The patient has no complaints or symptoms. Oncologic Breast 2007 Psychiatric The patient has no complaints or symptoms. Objective Hailey Ellison, COGGESHALL Ellison. ([id number]) Constitutional Sitting or standing Blood Pressure is within target range for patient.. Pulse regular and within target range for patient.Marland Kitchen Respirations regular, non-labored and within target range.. Temperature is normal and within the target range for the patient.. Patient's appearance is neat and clean. Appears in no acute distress. Well nourished and well developed.. Vitals Time Taken: 3:11 PM, Height: 65 in, Weight: 184 lbs, BMI: 30.6, Temperature: 98.30 F, Pulse: 71 bpm, Respiratory Rate: 18 breaths/min, Blood Pressure:  125/71 mmHg. Eyes Conjunctivae clear. No discharge.. Neck Neck supple and symmetrical. No masses or crepitus. Respiratory Respiratory effort is easy and symmetric bilaterally. Rate is normal at rest and on room air.. Bilateral breath sounds are clear and equal in all lobes with no wheezes, rales or rhonchi.. Cardiovascular 3/6 SEM consistent with AS radiates into left carotid. No signee of chi. Chest The patient has a small opening in the left breast appprox 85mm. I did not attempt to probe this. Previously described as less than 85mm. There is a firmness to the area in the left breast just above the draining site. No tenderness. Gastrointestinal (GI) Abdomen is soft and non-distended without masses or tenderness. Bowel sounds active in all quadrants.. No liver or spleen enlargement or tenderness.. Lymphatic None palpable in the the left axilla clavicular and cervical. Psychiatric No evidence of depression, anxiety, or agitation. Calm, cooperative, and communicative. Appropriate interactions and affect.. General Notes: Wound exam: again area which is a small draining sinus in t he left upper quadrant. No erythema and no tenderness. There is clear fluid which is expressed with palpation around the area. This does not appear purulent. The area is really to small to  measure at this point Integumentary (Hair, Skin) Wound #1 status is Open. Original cause of wound was Surgical Injury. The wound is located on the Left Breast. The wound measures 0.1cm length x 0.1cm width x 0.1cm depth; 0.008cm^2 area and 0.001cm^3 volume. The wound is limited to skin breakdown. There is a medium amount of serous drainage noted. The wound margin is indistinct and nonvisible. The periwound skin appearance exhibited: Moist. The periwound skin appearance did not exhibit: Callus, Crepitus, Excoriation, Fluctuance, Friable, Induration, Localized LAFONDRA, HESTERBERG Ellison. ([id number]) Edema, Rash, Scarring, Dry/Scaly,  Maceration, Atrophie Blanche, Cyanosis, Ecchymosis, Hemosiderin Staining, Mottled, Pallor, Rubor, Erythema. Assessment Active Problems ICD-10 S21.002D - Unspecified open wound of left breast, subsequent encounter N64.1 - Fat necrosis of breast C50.412 - Malignant neoplasm of upper-outer quadrant of left female breast Plan Wound Cleansing: Wound #1 Left Breast: Clean wound with Normal Saline. Primary Wound Dressing: Wound #1 Left Breast: Iodosorb Ointment Secondary Dressing: Wound #1 Left Breast: Boardered Foam Dressing Dressing Change Frequency: Wound #1 Left Breast: Change dressing every day. Follow-up Appointments: Wound #1 Left Breast: Return Appointment in 1 week. Laboratory ordered were: Wound culture routine 1This draining area is too small to pack with any form of standard dressing. She has previously tried [employer] and some form of silver based wound gel. We elected to dry Iodosorb ointment although I am not optomisti this will have any effect. Covered with a foam dressing SAKARA, LOREE Ellison. ([id number]) 2; The cultures optained during her recent stay at [place] grew coag negative Staph and diptheroids. I am not thinking that this is pathogenic rather skin flora. I do not see she recieved antibiotics however I don't believe any are currently indicated pending repeat culture. 3: It is supected all of this is secondary to Fat necrosis. It is unlikely that any type of dressing will effec this. The patient has been offered surgical exision of this area. She is still contemplating this Electronic Signature(s) Signed: 06/20/2016 12:38:30 PM By: Linton Ham MD Entered By: Linton Ham on 06/20/2016 07:14:45 Hailey Ellison ([id number]) -------------------------------------------------------------------------------- ROS/PFSH Details Patient Name: COURTNAY, PROVOST. Date of Service: 06/19/2016 3:00 PM Medical Record Patient Account Number:  [id number] Number: Treating RN: Cornell Barman [birth year] (80 y.o. Other Clinician: Date of Birth/Sex: Female) Treating Zareth Rippetoe Primary Care Physician/Extender: Jeryl Columbia Physician: Referring Physician: Luiz Ochoa in Treatment: 0 Information Obtained From Patient Wound History Do you currently have one or more open woundso Yes How many open wounds do you currently haveo 1 Approximately how long have you had your woundso 1 YEAR How have you been treating your wound(s) until nowo bandage Has your wound(s) ever healed and then re-openedo No Have you had any lab work done in the past montho Yes Have you tested positive for an antibiotic resistant organism (MRSA, VRE)o No Have you tested positive for osteomyelitis (bone infection)o No Have you had any tests for circulation on your legso No Have you had other problems associated with your woundso Infection Constitutional Symptoms (General Health) Complaints and Symptoms: No Complaints or Symptoms Complaints and Symptoms: Negative for: Fatigue; Fever; Chills; Marked Weight Change Medical History: Past Medical History Notes: Breast Cancer; MI (2 weeks ago); CKD; TIA Eyes Complaints and Symptoms: Negative for: Dry Eyes; Vision Changes; Glasses / Contacts Medical History: Negative for: Cataracts; Glaucoma; Optic Neuritis Ear/Nose/Mouth/Throat Complaints and Symptoms: Negative for: Difficult clearing ears; Sinusitis JURIDIA, NAMBO ([id number]) Medical History: Negative for: Chronic sinus problems/congestion; Middle ear problems Hematologic/Lymphatic Complaints and Symptoms: Negative for:  Bleeding / Clotting Disorders; Human Immunodeficiency Virus Medical History: Negative for: Anemia; Hemophilia; Human Immunodeficiency Virus; Lymphedema; Sickle Cell Disease Respiratory Complaints and Symptoms: Positive for: Shortness of Breath Negative for: Chronic or frequent coughs Medical  History: Negative for: Aspiration; Asthma; Chronic Obstructive Pulmonary Disease (COPD); Pneumothorax; Sleep Apnea; Tuberculosis Cardiovascular Complaints and Symptoms: Positive for: LE edema Negative for: Chest pain Medical History: Positive for: Coronary Artery Disease - stent (May 16); Hypertension; Myocardial Infarction Negative for: Angina; Arrhythmia; Congestive Heart Failure; Deep Vein Thrombosis; Hypotension; Peripheral Arterial Disease; Peripheral Venous Disease; Phlebitis; Vasculitis Endocrine Complaints and Symptoms: No Complaints or Symptoms Complaints and Symptoms: Negative for: Hepatitis; Thyroid disease; Polydypsia (Excessive Thirst) Medical History: Negative for: Type I Diabetes; Type II Diabetes Genitourinary Complaints and Symptoms: No Complaints or Symptoms Complaints and Symptoms: Negative for: Kidney failure/ Dialysis; Incontinence/dribbling Medical History: CATANA, CROFTON ([id number]) Negative for: End Stage Renal Disease Past Medical History Notes: CKD Integumentary (Skin) Complaints and Symptoms: Positive for: Wounds Negative for: Bleeding or bruising tendency; Breakdown; Swelling Medical History: Negative for: History of Burn; History of pressure wounds Gastrointestinal Complaints and Symptoms: No Complaints or Symptoms Medical History: Negative for: Cirrhosis ; Colitis; Crohnos; Hepatitis A; Hepatitis B; Hepatitis C Immunological Complaints and Symptoms: No Complaints or Symptoms Medical History: Negative for: Lupus Erythematosus; Raynaudos; Scleroderma Musculoskeletal Complaints and Symptoms: No Complaints or Symptoms Medical History: Positive for: Osteoarthritis Negative for: Gout; Rheumatoid Arthritis; Osteomyelitis Neurologic Complaints and Symptoms: No Complaints or Symptoms Medical History: Negative for: Dementia; Neuropathy; Quadriplegia; Paraplegia; Seizure Disorder Oncologic Complaints and Symptoms: Review of System  Notes: Breast 2007 ZERLINE, SPIVA. ([id number]) Medical History: Positive for: Received Chemotherapy; Received Radiation Psychiatric Complaints and Symptoms: No Complaints or Symptoms Medical History: Negative for: Anorexia/bulimia; Confinement Anxiety Hospitalization / Surgery History Name of Hospital Purpose of Hospitalization/Surgery Date [place] Heart attack 05/08/2016 Family and Social History Cancer: Yes - Siblings; Diabetes: Yes - Child; Heart Disease: Yes - Father, Siblings; Hypertension: Yes - Mother, Father, Siblings; Kidney Disease: Yes - Child; Lung Disease: No; Seizures: No; Stroke: No; Thyroid Problems: No; Never smoker; Marital Status - Widowed; Alcohol Use: Never; Drug Use: No History; Caffeine Use: Moderate; Advanced Directives: No; Living Will: No Electronic Signature(s) Signed: 06/19/2016 4:48:44 PM By: Gretta Cool RN, BSN, Kim RN, BSN Signed: 06/20/2016 12:38:30 PM By: Linton Ham MD Entered By: Gretta Cool, RN, BSN, Kim on 06/19/2016 15:20:54 RAYANNE, THEDFORD ([id number]) -------------------------------------------------------------------------------- [place] Details Patient Name: DENESSA, HUMPHRES. Date of Service: 06/19/2016 Medical Record Patient Account Number: [id number] Number: Treating RN: Cornell Barman 1935-07-18 [id number] y.o. Other Clinician: Date of Birth/Sex: Female) Treating Harlee Eckroth Primary Care Physician/Extender: Jeryl Columbia Physician: Weeks in Treatment: 0 Referring Physician: Etta Quill Diagnosis Coding ICD-10 Codes Code Description S21.002D Unspecified open wound of left breast, subsequent encounter Facility Procedures CPT4 Code: [id number] Description: [place] VISIT-LEV 3 EST PT Modifier: Quantity: 1 Physician Procedures CPT4 Code: [id number] Description: [id number] - WC PHYS LEVEL 4 - NEW PT ICD-10 Description Diagnosis S21.002D Unspecified open wound of left breast, subseque Modifier: nt encounter Quantity:  1 Electronic Signature(s) Signed: 06/20/2016 12:38:30 PM By: Linton Ham MD Previous Signature: 06/19/2016 4:48:44 PM Version By: Gretta Cool RN, BSN, Kim RN, BSN Entered By: Linton Ham on 06/20/2016 07:19:17

## 2016-06-20 NOTE — Progress Notes (Signed)
Hailey Ellison, Hailey Ellison ([id number]) Visit Report for 06/19/2016 Abuse/Suicide Risk Screen Details Patient Name: Hailey Ellison, Hailey Ellison. Date of Service: 06/19/2016 3:00 PM Medical Record Patient Account Number: [id number] Number: Treating RN: Cornell Barman 03-17-1935 (80 y.o. Other Clinician: Date of Birth/Sex: Female) Treating ROBSON, MICHAEL Primary Care Physician/Extender: Jeryl Columbia Physician: Referring Physician: Etta Quill Weeks in Treatment: 0 Abuse/Suicide Risk Screen Items Answer ABUSE/SUICIDE RISK SCREEN: Has anyone close to you tried to hurt or harm you recentlyo No Do you feel uncomfortable with anyone in your familyo No Has anyone forced you do things that you didnot want to doo No Do you have any thoughts of harming yourselfo No Patient displays signs or symptoms of abuse and/or neglect. No Electronic Signature(s) Signed: 06/19/2016 4:48:44 PM By: Gretta Cool, RN, BSN, Kim RN, BSN Entered By: Gretta Cool, RN, BSN, Kim on 06/19/2016 15:21:08 Hailey Ellison, Hailey Ellison ([id number]) -------------------------------------------------------------------------------- Activities of Daily Living Details Patient Name: Hailey Ellison, Hailey Ellison. Date of Service: 06/19/2016 3:00 PM Medical Record Patient Account Number: [id number] Number: Treating RN: Cornell Barman 22-Oct-1935 (80 y.o. Other Clinician: Date of Birth/Sex: Female) Treating ROBSON, MICHAEL Primary Care Physician/Extender: Jeryl Columbia Physician: Referring Physician: Etta Quill Weeks in Treatment: 0 Activities of Daily Living Items Answer Activities of Daily Living (Please select one for each item) Drive Automobile Completely Able Take Medications Completely Able Use Telephone Completely Able Care for Appearance Completely Able Use Toilet Completely Able Bath / Shower Completely Able Dress Self Completely Able Feed Self Completely Able Walk Completely Able Get In / Out Bed Completely Able Housework Completely  Able Prepare Meals Completely Able Handle Money Completely Able Shop for Self Completely Able Electronic Signature(s) Signed: 06/19/2016 4:48:44 PM By: Gretta Cool, RN, BSN, Kim RN, BSN Entered By: Gretta Cool, RN, BSN, Kim on 06/19/2016 15:21:19 Hailey Ellison, Hailey Ellison ([id number]) -------------------------------------------------------------------------------- Education Assessment Details Patient Name: Hailey Ellison, Hailey R. Date of Service: 06/19/2016 3:00 PM Medical Record Patient Account Number: [id number] Number: Treating RN: Cornell Barman Apr 30, 1935 (80 y.o. Other Clinician: Date of Birth/Sex: Female) Treating ROBSON, MICHAEL Primary Care Physician/Extender: Jeryl Columbia Physician: Referring Physician: Luiz Ochoa in Treatment: 0 Primary Learner Assessed: Patient Learning Preferences/Education Level/Primary Language Learning Preference: Explanation Highest Education Level: High School Preferred Language: English Cognitive Barrier Assessment/Beliefs Language Barrier: No Translator Needed: No Memory Deficit: No Emotional Barrier: No Cultural/Religious Beliefs Affecting Medical No Care: Physical Barrier Assessment Impaired Vision: Yes Glasses Impaired Hearing: No Decreased Hand dexterity: No Knowledge/Comprehension Assessment Knowledge Level: High Comprehension Level: High Ability to understand written High instructions: Ability to understand verbal High instructions: Motivation Assessment Anxiety Level: Calm Cooperation: Cooperative Education Importance: Acknowledges Need Interest in Health Problems: Asks Questions Perception: Coherent Willingness to Engage in Self- High Management Activities: Hailey Ellison, Hailey Ellison ([id number]) Readiness to Engage in Self- Management Activities: Electronic Signature(s) Signed: 06/19/2016 4:48:44 PM By: Gretta Cool, RN, BSN, Kim RN, BSN Entered By: Gretta Cool, RN, BSN, Kim on 06/19/2016 15:22:01 Hailey Ellison, Hailey Ellison  ([id number]) -------------------------------------------------------------------------------- Fall Risk Assessment Details Patient Name: Hailey Ellison. Date of Service: 06/19/2016 3:00 PM Medical Record Patient Account Number: [id number] Number: Treating RN: Cornell Barman 26-Feb-1935 (80 y.o. Other Clinician: Date of Birth/Sex: Female) Treating ROBSON, MICHAEL Primary Care Physician/Extender: Jeryl Columbia Physician: Referring Physician: Luiz Ochoa in Treatment: 0 Fall Risk Assessment Items Have you had 2 or more falls in the last 12 monthso 0 Yes Have you had any fall that resulted in injury in the last 12 monthso 0 No FALL RISK ASSESSMENT: History of falling -  immediate or within 3 months 0 No Secondary diagnosis 0 No Ambulatory aid None/bed rest/wheelchair/nurse 0 Yes Crutches/cane/walker 0 No Furniture 0 No IV Access/Saline Lock 0 No Gait/Training Normal/bed rest/immobile 0 No Weak 10 Yes Impaired 0 No Mental Status Oriented to own ability 0 Yes Electronic Signature(s) Signed: 06/19/2016 4:48:44 PM By: Gretta Cool, RN, BSN, Kim RN, BSN Entered By: Gretta Cool, RN, BSN, Kim on 06/19/2016 15:22:31 Hailey Ellison, Hailey Ellison ([id number]) -------------------------------------------------------------------------------- Nutrition Risk Assessment Details Patient Name: Hailey Ellison, Hailey R. Date of Service: 06/19/2016 3:00 PM Medical Record Patient Account Number: [id number] Number: Treating RN: Cornell Barman 31-Oct-1935 (80 y.o. Other Clinician: Date of Birth/Sex: Female) Treating ROBSON, MICHAEL Primary Care Physician/Extender: Jeryl Columbia Physician: Referring Physician: Etta Quill Weeks in Treatment: 0 Height (in): 65 Weight (lbs): 184 Body Mass Index (BMI): 30.6 Nutrition Risk Assessment Items NUTRITION RISK SCREEN: I have an illness or condition that made me change the kind and/or 0 No amount of food I eat I eat fewer than two meals per day 0  No I eat few fruits and vegetables, or milk products 2 Yes I have three or more drinks of beer, liquor or wine almost every day 0 No I have tooth or mouth problems that make it hard for me to eat 0 No I don't always have enough money to buy the food I need 0 No I eat alone most of the time 0 No I take three or more different prescribed or over-the-counter drugs a 1 Yes day Without wanting to, I have lost or gained 10 pounds in the last six 0 No months I am not always physically able to shop, cook and/or feed myself 0 No Nutrition Protocols Good Risk Protocol Provide education on Moderate Risk Protocol 0 nutrition Electronic Signature(s) Signed: 06/19/2016 4:48:44 PM By: Gretta Cool, RN, BSN, Kim RN, BSN Entered By: Gretta Cool, RN, BSN, Kim on 06/19/2016 15:22:59

## 2016-06-23 LAB — AEROBIC CULTURE  (SUPERFICIAL SPECIMEN)

## 2016-06-23 LAB — AEROBIC CULTURE W GRAM STAIN (SUPERFICIAL SPECIMEN): Gram Stain: NONE SEEN

## 2016-06-25 ENCOUNTER — Encounter: Payer: Medicare Other | Attending: Cardiovascular Disease | Admitting: *Deleted

## 2016-06-25 VITALS — Ht 64.5 in | Wt 184.4 lb

## 2016-06-25 DIAGNOSIS — Z9861 Coronary angioplasty status: Secondary | ICD-10-CM | POA: Insufficient documentation

## 2016-06-25 DIAGNOSIS — Z955 Presence of coronary angioplasty implant and graft: Secondary | ICD-10-CM | POA: Diagnosis not present

## 2016-06-25 DIAGNOSIS — M1990 Unspecified osteoarthritis, unspecified site: Secondary | ICD-10-CM | POA: Diagnosis not present

## 2016-06-25 DIAGNOSIS — X58XXXD Exposure to other specified factors, subsequent encounter: Secondary | ICD-10-CM | POA: Diagnosis not present

## 2016-06-25 DIAGNOSIS — I2510 Atherosclerotic heart disease of native coronary artery without angina pectoris: Secondary | ICD-10-CM | POA: Insufficient documentation

## 2016-06-25 DIAGNOSIS — C50412 Malignant neoplasm of upper-outer quadrant of left female breast: Secondary | ICD-10-CM | POA: Insufficient documentation

## 2016-06-25 DIAGNOSIS — N189 Chronic kidney disease, unspecified: Secondary | ICD-10-CM | POA: Insufficient documentation

## 2016-06-25 DIAGNOSIS — Z8673 Personal history of transient ischemic attack (TIA), and cerebral infarction without residual deficits: Secondary | ICD-10-CM | POA: Insufficient documentation

## 2016-06-25 DIAGNOSIS — I213 ST elevation (STEMI) myocardial infarction of unspecified site: Secondary | ICD-10-CM

## 2016-06-25 DIAGNOSIS — I252 Old myocardial infarction: Secondary | ICD-10-CM | POA: Diagnosis not present

## 2016-06-25 DIAGNOSIS — N641 Fat necrosis of breast: Secondary | ICD-10-CM | POA: Diagnosis not present

## 2016-06-25 DIAGNOSIS — S21002D Unspecified open wound of left breast, subsequent encounter: Secondary | ICD-10-CM | POA: Insufficient documentation

## 2016-06-25 DIAGNOSIS — I129 Hypertensive chronic kidney disease with stage 1 through stage 4 chronic kidney disease, or unspecified chronic kidney disease: Secondary | ICD-10-CM | POA: Insufficient documentation

## 2016-06-25 NOTE — Progress Notes (Signed)
Daily Session Note  Patient Details  Name: Hailey Ellison MRN: [id number] Date of Birth: 30-Jul-1935 Referring Provider:        Cardiac Rehab from 06/25/2016 in [facility]   Referring Provider  Arida      Encounter Date: 06/25/2016  Check In:     Session Check In - 06/25/16 1442    Check-In   Location [facility]-Cardiac & Pulmonary Rehab   Staff Present Heath Lark, RN, BSN, CCRP;Diane Joya Gaskins, RN, Vickki Hearing, BA, ACSM CEP, Exercise Physiologist   Supervising physician immediately available to respond to emergencies See telemetry face sheet for immediately available ER MD   Medication changes reported     No   Warm-up and Cool-down Not performed (comment)   Resistance Training Performed No   VAD Patient? No   Pain Assessment   Currently in Pain? No/denies           Exercise Prescription Changes - 06/25/16 1400    Response to Exercise   Blood Pressure (Admit) 110/60 mmHg   Blood Pressure (Exercise) 118/56 mmHg   Blood Pressure (Exit) 94/58 mmHg   Heart Rate (Admit) 81 bpm   Heart Rate (Exercise) 110 bpm   Heart Rate (Exit) 73 bpm   Rating of Perceived Exertion (Exercise) 13      Goals Met:  Exercise tolerated well Personal goals reviewed No report of cardiac concerns or symptoms  Goals Unmet:  Not Applicable  Comments: First session/medical review completed   Dr. Emily Filbert is Medical Director for [place] and [facility].

## 2016-06-25 NOTE — Progress Notes (Signed)
Cardiac Individual Treatment Plan  Patient Details  Name: Hailey Ellison MRN: [id number] Date of Birth: January 24, 1935 Referring Provider:        Cardiac Rehab from 06/25/2016 in [facility] Cardiac and Pulmonary Rehab   Referring Provider  Arida      Initial Encounter Date:       Cardiac Rehab from 06/25/2016 in [facility] Cardiac and Pulmonary Rehab   Date  06/25/16   Referring Provider  Fletcher Anon      Visit Diagnosis: ST elevation myocardial infarction (STEMI), unspecified artery ([place])  Status post coronary artery stent placement  Patient's Home Medications on Admission:  Current outpatient prescriptions:  .  acetaminophen (TYLENOL) 650 MG CR tablet, Take by mouth., Disp: , Rfl:  .  anastrozole (ARIMIDEX) 1 MG tablet, Take 1 mg by mouth daily., Disp: , Rfl:  .  aspirin 81 MG chewable tablet, Chew 1 tablet (81 mg total) by mouth daily., Disp: 30 tablet, Rfl: 2 .  atorvastatin (LIPITOR) 40 MG tablet, Take 1 tablet (40 mg total) by mouth at bedtime., Disp: 30 tablet, Rfl: 2 .  calcium citrate-vitamin D (CITRACAL+D) 315-200 MG-UNIT per tablet, Take 1 tablet by mouth 2 (two) times daily., Disp: , Rfl:  .  furosemide (LASIX) 20 MG tablet, Take 20 mg by mouth daily as needed for fluid. , Disp: , Rfl:  .  glucosamine-chondroitin 500-400 MG tablet, Take 2 tablets by mouth daily., Disp: , Rfl:  .  lansoprazole (PREVACID) 30 MG capsule, Take 30 mg by mouth daily., Disp: , Rfl:  .  Multiple Vitamin (MULTIVITAMIN WITH MINERALS) TABS, Take 1 tablet by mouth daily., Disp: , Rfl:  .  PARoxetine (PAXIL) 40 MG tablet, Take 40 mg by mouth daily., Disp: , Rfl:  .  ticagrelor (BRILINTA) 90 MG TABS tablet, Take 1 tablet (90 mg total) by mouth 2 (two) times daily., Disp: 60 tablet, Rfl: 0 .  dipyridamole-aspirin (AGGRENOX) 200-25 MG per 12 hr capsule, Take 1 capsule by mouth 2 (two) times daily. Reported on 06/25/2016, Disp: , Rfl:  .  hydrochlorothiazide (HYDRODIURIL) 25 MG tablet, Take 0.5 tablets (12.5 mg total) by mouth  daily. (Patient not taking: Reported on 06/25/2016), Disp: , Rfl:  .  valsartan (DIOVAN) 320 MG tablet, Take 320 mg by mouth daily., Disp: , Rfl:  .  verapamil (CALAN) 120 MG tablet, Take 60 mg by mouth daily., Disp: , Rfl:   Past Medical History: Past Medical History  Diagnosis Date  . Hypertension   . Murmur   . Radiation 2010    with chemo  . Arthritis   . Lymphedema   . Chronic kidney disease 2014  . Cancer ([place]) 2010    T1c,N2a, M0; ER 90%, PR 60%,  HER-2/neu 2+ IHC, equivocal on fish. 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  Margins clear.  . Breast cancer ([place])     Adjuvant chemotherapy with Taxotere/Carboplatin/Herceptin. Arimidex initiated November 2011  . Squamous cell carcinoma [facility]) June 2016    Left upper inner chest wall, area of previous radiation.    Tobacco Use: History  Smoking status  . Former Smoker  Smokeless tobacco  . Never Used    Labs: Recent [employer] for ITP Cardiac and Pulmonary Rehab Latest Ref Rng 06/08/2016   Cholestrol 0 - 200 mg/dL 135   LDLCALC 0 - 99 mg/dL 81   HDL >40  mg/dL 47   Trlycerides <150 mg/dL 37       Exercise Target Goals: Date: 06/25/16  Exercise Program Goal: Individual exercise prescription set with THRR, safety & activity barriers. Participant demonstrates ability to understand and report RPE using BORG scale, to self-measure pulse accurately, and to acknowledge the importance of the exercise prescription.  Exercise Prescription Goal: Starting with aerobic activity 30 plus minutes a day, 3 days per week for initial exercise prescription. Provide home exercise prescription and guidelines that participant acknowledges understanding prior to discharge.  Activity Barriers & Risk Stratification:     Activity Barriers & Cardiac Risk Stratification - 06/25/16 1458    Activity  Barriers & Cardiac Risk Stratification   Activity Barriers Joint Problems;Back Problems;Shortness of Breath;Deconditioning  Was to have left knee replacement and had heart attack week before scheduled surgery.  BAck problems are old and no concerns at present. Will let staff know if exercise flares back . SOB with exertion. Questions if may be deconditioning. Has osteoporosis   Cardiac Risk Stratification High      6 Minute Walk:     6 Minute Walk      06/25/16 1424       6 Minute Walk   Phase Initial     Distance 900 feet     Walk Time 6 minutes     MPH 1.7     METS 1.5     RPE 13     VO2 Peak 5.3     Symptoms No     Resting HR 81 bpm     Resting BP 110/60 mmHg     Max Ex. HR 110 bpm     Max Ex. BP 118/56 mmHg        Initial Exercise Prescription:     Initial Exercise Prescription - 06/25/16 1400    Date of Initial Exercise RX and Referring Provider   Date 06/25/16   Referring Provider Arida   Recumbant Bike   Level 1   RPM 50   Watts 20   Minutes 15   NuStep   Level 1   Watts 25   Minutes 15   T5 Nustep   Level 1   Watts 25   Minutes 15   Biostep-RELP   Level 1   Watts 25   Minutes 15   Prescription Details   Frequency (times per week) 3   Duration Progress to 30 minutes of continuous aerobic without signs/symptoms of physical distress   Intensity   THRR 40-80% of Max Heartrate 105-128   Ratings of Perceived Exertion 11-13   Perceived Dyspnea 0-4   Progression   Progression Continue to progress workloads to maintain intensity without signs/symptoms of physical distress.   Resistance Training   Training Prescription Yes   Weight 1   Reps 10-15      Perform Capillary Blood Glucose checks as needed.  Exercise Prescription Changes:     Exercise Prescription Changes      06/25/16 1400           Response to Exercise   Blood Pressure (Admit) 110/60 mmHg       Blood Pressure (Exercise) 118/56 mmHg       Blood Pressure (Exit) 94/58 mmHg        Heart Rate (Admit) 81 bpm       Heart Rate (Exercise) 110 bpm       Heart Rate (Exit) 73 bpm       Rating of Perceived Exertion (  Exercise) 13          Exercise Comments:   Discharge Exercise Prescription (Final Exercise Prescription Changes):     Exercise Prescription Changes - 06/25/16 1400    Response to Exercise   Blood Pressure (Admit) 110/60 mmHg   Blood Pressure (Exercise) 118/56 mmHg   Blood Pressure (Exit) 94/58 mmHg   Heart Rate (Admit) 81 bpm   Heart Rate (Exercise) 110 bpm   Heart Rate (Exit) 73 bpm   Rating of Perceived Exertion (Exercise) 13      Nutrition:  Target Goals: Understanding of nutrition guidelines, daily intake of sodium '1500mg'$ , cholesterol '200mg'$ , calories 30% from fat and 7% or less from saturated fats, daily to have 5 or more servings of fruits and vegetables.  Biometrics:     Pre Biometrics - 06/25/16 1423    Pre Biometrics   Height 5' 4.5" (1.638 m)   Weight 184 lb 6.4 oz (83.643 kg)   Waist Circumference 42.5 inches   Hip Circumference 47.25 inches   Waist to Hip Ratio 0.9 %   BMI (Calculated) 31.2   Single Leg Stand 1.32 seconds       Nutrition Therapy Plan and Nutrition Goals:     Nutrition Therapy & Goals - 06/25/16 1451    Intervention Plan   Intervention Prescribe, educate and counsel regarding individualized specific dietary modifications aiming towards targeted core components such as weight, hypertension, lipid management, diabetes, heart failure and other comorbidities.   Expected Outcomes Short Term Goal: Understand basic principles of dietary content, such as calories, fat, sodium, cholesterol and nutrients.;Short Term Goal: A plan has been developed with personal nutrition goals set during dietitian appointment.;Long Term Goal: Adherence to prescribed nutrition plan.      Nutrition Discharge: Rate Your Plate Scores:   Nutrition Goals Re-Evaluation:   Psychosocial: Target Goals: Acknowledge presence or absence  of depression, maximize coping skills, provide positive support system. Participant is able to verbalize types and ability to use techniques and skills needed for reducing stress and depression.  Initial Review & Psychosocial Screening:     Initial Psych Review & Screening - 06/25/16 1452    Initial Review   Current issues with Current Depression;Current Psychotropic Meds   Family Dynamics   Good Support System? Yes  2 daughters.  Husband died in 10/18/2013   Barriers   Psychosocial barriers to participate in program There are no identifiable barriers or psychosocial needs.;The patient should benefit from training in stress management and relaxation.   Screening Interventions   Interventions Encouraged to exercise      Quality of Life Scores:     Quality of Life - 06/25/16 1457    Quality of Life Scores   Health/Function Pre 24.32 %   Socioeconomic Pre 29.58 %   Psych/Spiritual Pre 30 %   Family Pre 28.5 %   GLOBAL Pre 27.16 %      PHQ-9:     Recent Review Flowsheet Data    Depression screen [facility] 2/9 06/25/2016   Decreased Interest 1   Down, Depressed, Hopeless 0   PHQ - 2 Score 1   Altered sleeping 1   Tired, decreased energy 2   Change in appetite 3   Feeling bad or failure about yourself  0   Trouble concentrating 0   Moving slowly or fidgety/restless 0   PHQ-9 Score 7   Difficult doing work/chores Somewhat difficult      Psychosocial Evaluation and Intervention:   Psychosocial Re-Evaluation:  Vocational Rehabilitation: Provide vocational rehab assistance to qualifying candidates.   Vocational Rehab Evaluation & Intervention:     Vocational Rehab - 06/25/16 1502    Initial Vocational Rehab Evaluation & Intervention   Assessment shows need for Vocational Rehabilitation No      Education: Education Goals: Education classes will be provided on a weekly basis, covering required topics. Participant will state understanding/return demonstration of topics  presented.  Learning Barriers/Preferences:     Learning Barriers/Preferences - 06/25/16 1501    Learning Barriers/Preferences   Learning Barriers None   Learning Preferences Verbal Instruction;Written Material      Education Topics: General Nutrition Guidelines/Fats and Fiber: -Group instruction provided by verbal, written material, models and posters to present the general guidelines for heart healthy nutrition. Gives an explanation and review of dietary fats and fiber.   Controlling Sodium/Reading Food Labels: -Group verbal and written material supporting the discussion of sodium use in heart healthy nutrition. Review and explanation with models, verbal and written materials for utilization of the food label.   Exercise Physiology & Risk Factors: - Group verbal and written instruction with models to review the exercise physiology of the cardiovascular system and associated critical values. Details cardiovascular disease risk factors and the goals associated with each risk factor.   Aerobic Exercise & Resistance Training: - Gives group verbal and written discussion on the health impact of inactivity. On the components of aerobic and resistive training programs and the benefits of this training and how to safely progress through these programs.   Flexibility, Balance, General Exercise Guidelines: - Provides group verbal and written instruction on the benefits of flexibility and balance training programs. Provides general exercise guidelines with specific guidelines to those with heart or lung disease. Demonstration and skill practice provided.   Stress Management: - Provides group verbal and written instruction about the health risks of elevated stress, cause of high stress, and healthy ways to reduce stress.   Depression: - Provides group verbal and written instruction on the correlation between heart/lung disease and depressed mood, treatment options, and the stigmas  associated with seeking treatment.   Anatomy & Physiology of the Heart: - Group verbal and written instruction and models provide basic cardiac anatomy and physiology, with the coronary electrical and arterial systems. Review of: AMI, Angina, Valve disease, Heart Failure, Cardiac Arrhythmia, Pacemakers, and the ICD.   Cardiac Procedures: - Group verbal and written instruction and models to describe the testing methods done to diagnose heart disease. Reviews the outcomes of the test results. Describes the treatment choices: Medical Management, Angioplasty, or Coronary Bypass Surgery.   Cardiac Medications: - Group verbal and written instruction to review commonly prescribed medications for heart disease. Reviews the medication, class of the drug, and side effects. Includes the steps to properly store meds and maintain the prescription regimen.   Go Sex-Intimacy & Heart Disease, Get SMART - Goal Setting: - Group verbal and written instruction through game format to discuss heart disease and the return to sexual intimacy. Provides group verbal and written material to discuss and apply goal setting through the application of the S.M.A.R.T. Method.   Other Matters of the Heart: - Provides group verbal, written materials and models to describe Heart Failure, Angina, Valve Disease, and Diabetes in the realm of heart disease. Includes description of the disease process and treatment options available to the cardiac patient.   Exercise & Equipment Safety: - Individual verbal instruction and demonstration of equipment use and safety with use of the equipment.  Cardiac Rehab from 06/25/2016 in [facility] Cardiac and Pulmonary Rehab   Date  06/25/16   Educator  SB   Instruction Review Code  2- meets goals/outcomes      Infection Prevention: - Provides verbal and written material to individual with discussion of infection control including proper hand washing and proper equipment cleaning  during exercise session.      Cardiac Rehab from 06/25/2016 in [facility] Cardiac and Pulmonary Rehab   Date  06/25/16   Educator  SB   Instruction Review Code  2- meets goals/outcomes      Falls Prevention: - Provides verbal and written material to individual with discussion of falls prevention and safety.   Diabetes: - Individual verbal and written instruction to review signs/symptoms of diabetes, desired ranges of glucose level fasting, after meals and with exercise. Advice that pre and post exercise glucose checks will be done for 3 sessions at entry of program.    Knowledge Questionnaire Score:     Knowledge Questionnaire Score - 06/25/16 1501    Knowledge Questionnaire Score   Pre Score 21/27      Core Components/Risk Factors/Patient Goals at Admission:     Personal Goals and Risk Factors at Admission - 06/25/16 1451    Core Components/Risk Factors/Patient Goals on Admission    Weight Management Obesity;Yes   Intervention Weight Management: Develop a combined nutrition and exercise program designed to reach desired caloric intake, while maintaining appropriate intake of nutrient and fiber, sodium and fats, and appropriate energy expenditure required for the weight goal.;Weight Management: Provide education and appropriate resources to help participant work on and attain dietary goals.;Weight Management/Obesity: Establish reasonable short term and long term weight goals.   Expected Outcomes Short Term: Continue to assess and modify interventions until short term weight is achieved;Long Term: Adherence to nutrition and physical activity/exercise program aimed toward attainment of established weight goal;Weight Loss: Understanding of general recommendations for a balanced deficit meal plan, which promotes 1-2 lb weight loss per week and includes a negative energy balance of [phone] kcal/d   Sedentary Yes   Intervention Provide advice, education, support and counseling about physical  activity/exercise needs.;Develop an individualized exercise prescription for aerobic and resistive training based on initial evaluation findings, risk stratification, comorbidities and participant's personal goals.   Expected Outcomes Achievement of increased cardiorespiratory fitness and enhanced flexibility, muscular endurance and strength shown through measurements of functional capacity and personal statement of participant.   Increase Strength and Stamina Yes   Intervention Provide advice, education, support and counseling about physical activity/exercise needs.;Develop an individualized exercise prescription for aerobic and resistive training based on initial evaluation findings, risk stratification, comorbidities and participant's personal goals.   Expected Outcomes Achievement of increased cardiorespiratory fitness and enhanced flexibility, muscular endurance and strength shown through measurements of functional capacity and personal statement of participant.   Hypertension Yes   Intervention Provide education on lifestyle modifcations including regular physical activity/exercise, weight management, moderate sodium restriction and increased consumption of fresh fruit, vegetables, and low fat dairy, alcohol moderation, and smoking cessation.;Monitor prescription use compliance.   Expected Outcomes Short Term: Continued assessment and intervention until BP is < 140/71m HG in hypertensive participants. < 130/828mHG in hypertensive participants with diabetes, heart failure or chronic kidney disease.;Long Term: Maintenance of blood pressure at goal levels.   Lipids Yes   Intervention Provide education and support for participant on nutrition & aerobic/resistive exercise along with prescribed medications to achieve LDL '70mg'$ , HDL >'40mg'$ .   Expected Outcomes Short Term:  Participant states understanding of desired cholesterol values and is compliant with medications prescribed. Participant is following  exercise prescription and nutrition guidelines.;Long Term: Cholesterol controlled with medications as prescribed, with individualized exercise RX and with personalized nutrition plan. Value goals: LDL < '70mg'$ , HDL > 40 mg.      Core Components/Risk Factors/Patient Goals Review:    Core Components/Risk Factors/Patient Goals at Discharge (Final Review):    ITP Comments:     ITP Comments      06/25/16 1443           ITP Comments Medical review completed today with ITP created.  Continue with ITP. Documentation of  diagnosis found [name] 06/10/2016          Comments:

## 2016-06-25 NOTE — Patient Instructions (Signed)
Patient Instructions  Patient Details  Name: Hailey Ellison MRN: [id number] Date of Birth: 08/02/1935 Referring Provider:  Wellington Hampshire, MD  Below are the personal goals you chose as well as exercise and nutrition goals. Our goal is to help you keep on track towards obtaining and maintaining your goals. We will be discussing your progress on these goals with you throughout the program.  Initial Exercise Prescription:     Initial Exercise Prescription - 06/25/16 1400    Date of Initial Exercise RX and Referring Provider   Date 06/25/16   Referring Provider Arida   Recumbant Bike   Level 1   RPM 50   Watts 20   Minutes 15   NuStep   Level 1   Watts 25   Minutes 15   T5 Nustep   Level 1   Watts 25   Minutes 15   Biostep-RELP   Level 1   Watts 25   Minutes 15   Prescription Details   Frequency (times per week) 3   Duration Progress to 30 minutes of continuous aerobic without signs/symptoms of physical distress   Intensity   THRR 40-80% of Max Heartrate 105-128   Ratings of Perceived Exertion 11-13   Perceived Dyspnea 0-4   Progression   Progression Continue to progress workloads to maintain intensity without signs/symptoms of physical distress.   Resistance Training   Training Prescription Yes   Weight 1   Reps 10-15      Exercise Goals: Frequency: Be able to perform aerobic exercise three times per week working toward 3-5 days per week.  Intensity: Work with a perceived exertion of 11 (fairly light) - 15 (hard) as tolerated. Follow your new exercise prescription and watch for changes in prescription as you progress with the program. Changes will be reviewed with you when they are made.  Duration: You should be able to do 30 minutes of continuous aerobic exercise in addition to a 5 minute warm-up and a 5 minute cool-down routine.  Nutrition Goals: Your personal nutrition goals will be established when you do your nutrition analysis with the dietician.  The  following are nutrition guidelines to follow: Cholesterol < 200mg /day Sodium < 1500mg /day Fiber: Women over 50 yrs - 21 grams per day  Personal Goals:     Personal Goals and Risk Factors at Admission - 06/25/16 1451    Core Components/Risk Factors/Patient Goals on Admission    Weight Management Obesity;Yes   Intervention Weight Management: Develop a combined nutrition and exercise program designed to reach desired caloric intake, while maintaining appropriate intake of nutrient and fiber, sodium and fats, and appropriate energy expenditure required for the weight goal.;Weight Management: Provide education and appropriate resources to help participant work on and attain dietary goals.;Weight Management/Obesity: Establish reasonable short term and long term weight goals.   Expected Outcomes Short Term: Continue to assess and modify interventions until short term weight is achieved;Long Term: Adherence to nutrition and physical activity/exercise program aimed toward attainment of established weight goal;Weight Loss: Understanding of general recommendations for a balanced deficit meal plan, which promotes 1-2 lb weight loss per week and includes a negative energy balance of [phone] kcal/d   Sedentary Yes   Intervention Provide advice, education, support and counseling about physical activity/exercise needs.;Develop an individualized exercise prescription for aerobic and resistive training based on initial evaluation findings, risk stratification, comorbidities and participant's personal goals.   Expected Outcomes Achievement of increased cardiorespiratory fitness and enhanced flexibility, muscular endurance and strength shown  through measurements of functional capacity and personal statement of participant.   Increase Strength and Stamina Yes   Intervention Provide advice, education, support and counseling about physical activity/exercise needs.;Develop an individualized exercise prescription for  aerobic and resistive training based on initial evaluation findings, risk stratification, comorbidities and participant's personal goals.   Expected Outcomes Achievement of increased cardiorespiratory fitness and enhanced flexibility, muscular endurance and strength shown through measurements of functional capacity and personal statement of participant.   Hypertension Yes   Intervention Provide education on lifestyle modifcations including regular physical activity/exercise, weight management, moderate sodium restriction and increased consumption of fresh fruit, vegetables, and low fat dairy, alcohol moderation, and smoking cessation.;Monitor prescription use compliance.   Expected Outcomes Short Term: Continued assessment and intervention until BP is < 140/50mm HG in hypertensive participants. < 130/34mm HG in hypertensive participants with diabetes, heart failure or chronic kidney disease.;Long Term: Maintenance of blood pressure at goal levels.   Lipids Yes   Intervention Provide education and support for participant on nutrition & aerobic/resistive exercise along with prescribed medications to achieve LDL 70mg , HDL >40mg .   Expected Outcomes Short Term: Participant states understanding of desired cholesterol values and is compliant with medications prescribed. Participant is following exercise prescription and nutrition guidelines.;Long Term: Cholesterol controlled with medications as prescribed, with individualized exercise RX and with personalized nutrition plan. Value goals: LDL < 70mg , HDL > 40 mg.      Tobacco Use Initial Evaluation: History  Smoking status  . Former Smoker  Smokeless tobacco  . Never Used    Copy of goals given to participant.

## 2016-06-27 ENCOUNTER — Encounter: Payer: Medicare Other | Admitting: Internal Medicine

## 2016-06-27 ENCOUNTER — Other Ambulatory Visit: Payer: Medicare Other

## 2016-06-27 DIAGNOSIS — C50412 Malignant neoplasm of upper-outer quadrant of left female breast: Secondary | ICD-10-CM | POA: Diagnosis not present

## 2016-06-27 NOTE — Progress Notes (Addendum)
BOBBIJO, Ellison ([id number]) Visit Report for 06/27/2016 Arrival Information Details Patient Name: Hailey Ellison, Hailey Ellison. Date of Service: 06/27/2016 12:45 PM Medical Record Patient Account Number: [id number] Number: Treating RN: Baruch Gouty, RN, BSN, Rita Oct 08, 1935 [phone] y.o. Other Clinician: Date of Birth/Sex: Female) Treating ROBSON, MICHAEL Primary Care Physician/Extender: Jeryl Columbia Physician: Referring Physician: Abelardo Diesel in Treatment: 1 Visit Information History Since Last Visit Added or deleted any medications: No Patient Arrived: Ambulatory Any new allergies or adverse reactions: No Arrival Time: 14:41 Had a fall or experienced change in No Accompanied By: self activities of daily living that may affect Transfer Assistance: None risk of falls: Patient Identification Verified: Yes Signs or symptoms of abuse/neglect since last No Secondary Verification Process Yes visito Completed: Hospitalized since last visit: No Patient Requires Transmission- No Has Dressing in Place as Prescribed: Yes Based Precautions: Pain Present Now: No Patient Has Alerts: Yes Patient Alerts: Patient on Blood Thinner Aspirin, Brilinta Electronic Signature(s) Signed: 06/27/2016 2:41:28 PM By: Regan Lemming BSN, RN Entered By: Regan Lemming on 06/27/2016 14:41:27 Hailey Ellison ([id number]) -------------------------------------------------------------------------------- Clinic Level of Care Assessment Details Patient Name: Hailey Ellison. Date of Service: 06/27/2016 12:45 PM Medical Record Patient Account Number: [id number] Number: Treating RN: Baruch Gouty, RN, BSN, Rita [birth year] [phone] y.o. Other Clinician: Date of Birth/Sex: Female) Treating ROBSON, MICHAEL Primary Care Physician/Extender: Jeryl Columbia Physician: Referring Physician: Abelardo Diesel in Treatment: 1 Clinic Level of Care Assessment Items TOOL 4 Quantity Score []  - Use when only an  EandM is performed on FOLLOW-UP visit 0 ASSESSMENTS - Nursing Assessment / Reassessment X - Reassessment of Co-morbidities (includes updates in patient status) 1 10 X - Reassessment of Adherence to Treatment Plan 1 5 ASSESSMENTS - Wound and Skin Assessment / Reassessment X - Simple Wound Assessment / Reassessment - one wound 1 5 []  - Complex Wound Assessment / Reassessment - multiple wounds 0 []  - Dermatologic / Skin Assessment (not related to wound area) 0 ASSESSMENTS - Focused Assessment []  - Circumferential Edema Measurements - multi extremities 0 []  - Nutritional Assessment / Counseling / Intervention 0 []  - Lower Extremity Assessment (monofilament, tuning fork, pulses) 0 []  - Peripheral Arterial Disease Assessment (using hand held doppler) 0 ASSESSMENTS - Ostomy and/or Continence Assessment and Care []  - Incontinence Assessment and Management 0 []  - Ostomy Care Assessment and Management (repouching, etc.) 0 PROCESS - Coordination of Care X - Simple Patient / Family Education for ongoing care 1 15 []  - Complex (extensive) Patient / Family Education for ongoing care 0 []  - Staff obtains Consents, Records, Test Results / Process Orders 0 AMBRIANNA, BENTHAM ([id number]) []  - Staff telephones HHA, Nursing Homes / Clarify orders / etc 0 []  - Routine Transfer to another Facility (non-emergent condition) 0 []  - Routine Hospital Admission (non-emergent condition) 0 []  - New Admissions / Biomedical engineer / Ordering NPWT, Apligraf, etc. 0 []  - Emergency Hospital Admission (emergent condition) 0 []  - Simple Discharge Coordination 0 []  - Complex (extensive) Discharge Coordination 0 PROCESS - Special Needs []  - Pediatric / Minor Patient Management 0 []  - Isolation Patient Management 0 []  - Hearing / Language / Visual special needs 0 []  - Assessment of Community assistance (transportation, D/C planning, etc.) 0 []  - Additional assistance / Altered mentation 0 []  - Support Surface(s)  Assessment (bed, cushion, seat, etc.) 0 INTERVENTIONS - Wound Cleansing / Measurement X - Simple Wound Cleansing - one wound 1 5 []  - Complex Wound Cleansing - multiple wounds  0 X - Wound Imaging (photographs - any number of wounds) 1 5 []  - Wound Tracing (instead of photographs) 0 X - Simple Wound Measurement - one wound 1 5 []  - Complex Wound Measurement - multiple wounds 0 INTERVENTIONS - Wound Dressings X - Small Wound Dressing one or multiple wounds 1 10 []  - Medium Wound Dressing one or multiple wounds 0 []  - Large Wound Dressing one or multiple wounds 0 []  - Application of Medications - topical 0 []  - Application of Medications - injection 0 MEHAR, PELLISSIER R. ([id number]) INTERVENTIONS - Miscellaneous []  - External ear exam 0 []  - Specimen Collection (cultures, biopsies, blood, body fluids, etc.) 0 []  - Specimen(s) / Culture(s) sent or taken to Lab for analysis 0 []  - Patient Transfer (multiple staff / Harrel Lemon Lift / Similar devices) 0 []  - Simple Staple / Suture removal (25 or less) 0 []  - Complex Staple / Suture removal (26 or more) 0 []  - Hypo / Hyperglycemic Management (close monitor of Blood Glucose) 0 []  - Ankle / Brachial Index (ABI) - do not check if billed separately 0 X - Vital Signs 1 5 Has the patient been seen at the hospital within the last three years: Yes Total Score: 65 Level Of Care: New/Established - Level 2 Electronic Signature(s) Signed: 06/27/2016 5:42:05 PM By: Regan Lemming BSN, RN Entered By: Regan Lemming on 06/27/2016 14:55:24 Hailey Ellison ([id number]) -------------------------------------------------------------------------------- Encounter Discharge Information Details Patient Name: Hailey, RYKOWSKI R. Date of Service: 06/27/2016 12:45 PM Medical Record Patient Account Number: [id number] Number: Treating RN: Baruch Gouty, RN, BSN, Rita 03-Jun-1935 [phone] y.o. Other Clinician: Date of Birth/Sex: Female) Treating ROBSON, MICHAEL Primary Care  Physician/Extender: Jeryl Columbia Physician: Referring Physician: Abelardo Diesel in Treatment: 1 Encounter Discharge Information Items Discharge Pain Level: 0 Discharge Condition: Stable Ambulatory Status: Ambulatory Discharge Destination: Home Transportation: Private Auto Accompanied By: self Schedule Follow-up Appointment: No Medication Reconciliation completed and provided to Patient/Care No Christyana Corwin: Provided on Clinical Summary of Care: 06/27/2016 Form Type Recipient Paper Patient SK Electronic Signature(s) Signed: 06/27/2016 5:42:05 PM By: Regan Lemming BSN, RN Previous Signature: 06/27/2016 2:58:39 PM Version By: Ruthine Dose Entered By: Regan Lemming on 06/27/2016 15:02:59 Hailey Ellison ([id number]) -------------------------------------------------------------------------------- Multi Wound Chart Details Patient Name: Payton Doughty R. Date of Service: 06/27/2016 12:45 PM Medical Record Patient Account Number: [id number] Number: Treating RN: Baruch Gouty, RN, BSN, Rita 28-Jan-1935 [phone] y.o. Other Clinician: Date of Birth/Sex: Female) Treating ROBSON, MICHAEL Primary Care Physician/Extender: Jeryl Columbia Physician: Referring Physician: Abelardo Diesel in Treatment: 1 Vital Signs Height(in): 65 Pulse(bpm): 79 Weight(lbs): 184 Blood Pressure 126/52 (mmHg): Body Mass Index(BMI): 31 Temperature(F): 97.8 Respiratory Rate 18 (breaths/min): Photos: [1:No Photos] [N/A:N/A] Wound Location: [1:Left Breast] [N/A:N/A] Wounding Event: [1:Surgical Injury] [N/A:N/A] Primary Etiology: [1:Abscess] [N/A:N/A] Comorbid History: [1:Coronary Artery Disease, Hypertension, Myocardial Infarction, Osteoarthritis, Received Chemotherapy, Received Radiation] [N/A:N/A] Date Acquired: [1:05/25/2015] [N/A:N/A] Weeks of Treatment: [1:1] [N/A:N/A] Wound Status: [1:Open] [N/A:N/A] Measurements L x W x D 0.1x0.1x0.1 [N/A:N/A] (cm) Area (cm) : [1:0.008]  [N/A:N/A] Volume (cm) : [1:0.001] [N/A:N/A] % Reduction in Area: [1:0.00%] [N/A:N/A] % Reduction in Volume: 0.00% [N/A:N/A] Classification: [1:Partial Thickness] [N/A:N/A] Exudate Amount: [1:Medium] [N/A:N/A] Exudate Type: [1:Serous] [N/A:N/A] Exudate Color: [1:amber] [N/A:N/A] Wound Margin: [1:Indistinct, nonvisible] [N/A:N/A] Granulation Amount: [1:Large (67-100%)] [N/A:N/A] Granulation Quality: [1:Pink] [N/A:N/A] Necrotic Amount: [1:None Present (0%)] [N/A:N/A] Exposed Structures: [N/A:N/A] Fascia: No Fat: No Tendon: No Muscle: No Joint: No Bone: No Limited to Skin Breakdown Epithelialization: None N/A N/A Periwound Skin Texture: Edema: No  N/A N/A Excoriation: No Induration: No Callus: No Crepitus: No Fluctuance: No Friable: No Rash: No Scarring: No Periwound Skin Moist: Yes N/A N/A Moisture: Maceration: No Dry/Scaly: No Periwound Skin Color: Atrophie Blanche: No N/A N/A Cyanosis: No Ecchymosis: No Erythema: No Hemosiderin Staining: No Mottled: No Pallor: No Rubor: No Temperature: No Abnormality N/A N/A Tenderness on No N/A N/A Palpation: Wound Preparation: Ulcer Cleansing: N/A N/A Rinsed/Irrigated with Saline Topical Anesthetic Applied: None Treatment Notes Electronic Signature(s) Signed: 06/27/2016 2:49:47 PM By: Regan Lemming BSN, RN Entered By: Regan Lemming on 06/27/2016 14:49:47 Hailey Ellison ([id number]) -------------------------------------------------------------------------------- Multi-Disciplinary Care Plan Details Patient Name: AUDIA, FUNARO R. Date of Service: 06/27/2016 12:45 PM Medical Record Patient Account Number: [id number] Number: Treating RN: Baruch Gouty, RN, BSN, Rita [birth year] [phone] y.o. Other Clinician: Date of Birth/Sex: Female) Treating ROBSON, MICHAEL Primary Care Physician/Extender: Jeryl Columbia Physician: Referring Physician: Abelardo Diesel in Treatment: 1 Active Inactive Abuse / Safety / Falls /  Self Care Management Nursing Diagnoses: Potential for falls Goals: Patient will remain injury free Date Initiated: 06/19/2016 Goal Status: Active Interventions: Assess fall risk on admission and as needed Notes: Nutrition Nursing Diagnoses: Imbalanced nutrition Goals: Patient/caregiver agrees to and verbalizes understanding of need to use nutritional supplements and/or vitamins as prescribed Date Initiated: 06/19/2016 Goal Status: Active Interventions: Provide education on nutrition Notes: Orientation to the Wound Care Program Nursing Diagnoses: Knowledge deficit related to the wound healing center program DAEJA, OGBONNA ([id number]) Goals: Patient/caregiver will verbalize understanding of the [facility] Date Initiated: 06/19/2016 Goal Status: Active Interventions: Provide education on orientation to the wound center Notes: Wound/Skin Impairment Nursing Diagnoses: Impaired tissue integrity Goals: Ulcer/skin breakdown will heal within 14 weeks Date Initiated: 06/19/2016 Goal Status: Active Interventions: Assess patient/caregiver ability to obtain necessary supplies Notes: Electronic Signature(s) Signed: 06/27/2016 2:49:40 PM By: Regan Lemming BSN, RN Entered By: Regan Lemming on 06/27/2016 14:49:40 Hailey Ellison ([id number]) -------------------------------------------------------------------------------- Pain Assessment Details Patient Name: Payton Doughty R. Date of Service: 06/27/2016 12:45 PM Medical Record Patient Account Number: [id number] Number: Treating RN: Baruch Gouty, RN, BSN, Rita [birth year] [phone] y.o. Other Clinician: Date of Birth/Sex: Female) Treating ROBSON, MICHAEL Primary Care Physician/Extender: Jeryl Columbia Physician: Referring Physician: Abelardo Diesel in Treatment: 1 Active Problems Location of Pain Severity and Description of Pain Patient Has Paino No Site Locations With Dressing Change: No Pain Management  and Medication Current Pain Management: Electronic Signature(s) Signed: 06/27/2016 2:41:34 PM By: Regan Lemming BSN, RN Entered By: Regan Lemming on 06/27/2016 14:41:34 Hailey Ellison ([id number]) -------------------------------------------------------------------------------- Patient/Caregiver Education Details Patient Name: Hailey Ellison. Date of Service: 06/27/2016 12:45 PM Medical Record Patient Account Number: [id number] Number: Treating RN: Baruch Gouty, RN, BSN, Rita August 16, 1935 [phone] y.o. Other Clinician: Date of Birth/Gender: Female) Treating ROBSON, MICHAEL Primary Care Physician/Extender: Jeryl Columbia Physician: Suella Grove in Treatment: 1 Referring Physician: [name] Education Assessment Education Provided To: Patient Education Topics Provided Nutrition: Welcome To [facility]: Electronic Signature(s) Signed: 06/27/2016 5:42:05 PM By: Regan Lemming BSN, RN Entered By: Regan Lemming on 06/27/2016 15:03:15 Hailey Ellison ([id number]) -------------------------------------------------------------------------------- Wound Assessment Details Patient Name: NELLIEL, MARROW R. Date of Service: 06/27/2016 12:45 PM Medical Record Patient Account Number: [id number] Number: Treating RN: Baruch Gouty, RN, BSN, Rita 1935-02-27 [phone] y.o. Other Clinician: Date of Birth/Sex: Female) Treating ROBSON, MICHAEL Primary Care Physician/Extender: Jeryl Columbia Physician: Referring Physician: Abelardo Diesel in Treatment: 1 Wound Status Wound Number: 1 Primary Abscess Etiology: Wound Location: Left Breast Wound Open  Wounding Event: Surgical Injury Status: Date Acquired: 05/25/2015 Comorbid Coronary Artery Disease, Hypertension, Weeks Of Treatment: 1 History: Myocardial Infarction, Osteoarthritis, Clustered Wound: No Received Chemotherapy, Received Radiation Photos Photo Uploaded By: Regan Lemming on 06/27/2016 17:30:48 Wound Measurements Length:  (cm) 0.1 Width: (cm) 0.1 Depth: (cm) 0.1 Area: (cm) 0.008 Volume: (cm) 0.001 % Reduction in Area: 0% % Reduction in Volume: 0% Epithelialization: None Tunneling: No Wound Description Classification: Partial Thickness Wound Margin: Indistinct, nonvisible Exudate Amount: Medium Exudate Type: Serous Exudate Color: amber Wound Bed YONINA, BASTION. ([id number]) Granulation Amount: Large (67-100%) Exposed Structure Granulation Quality: Pink Fascia Exposed: No Necrotic Amount: None Present (0%) Fat Layer Exposed: No Tendon Exposed: No Muscle Exposed: No Joint Exposed: No Bone Exposed: No Limited to Skin Breakdown Periwound Skin Texture Texture Color No Abnormalities Noted: No No Abnormalities Noted: No Callus: No Atrophie Blanche: No Crepitus: No Cyanosis: No Excoriation: No Ecchymosis: No Fluctuance: No Erythema: No Friable: No Hemosiderin Staining: No Induration: No Mottled: No Localized Edema: No Pallor: No Rash: No Rubor: No Scarring: No Temperature / Pain Moisture Temperature: No Abnormality No Abnormalities Noted: No Dry / Scaly: No Maceration: No Moist: Yes Wound Preparation Ulcer Cleansing: Rinsed/Irrigated with Saline Topical Anesthetic Applied: None Treatment Notes Wound #1 (Left Breast) 4. Dressing Applied: Iodosorb Ointment 5. Secondary Dressing Applied Bordered Foam Dressing Dry Gauze Electronic Signature(s) Signed: 06/27/2016 5:42:05 PM By: Regan Lemming BSN, RN Entered By: Regan Lemming on 06/27/2016 14:49:26 Hailey Ellison ([id number]) -------------------------------------------------------------------------------- [place] Details Patient Name: Hailey Ellison. Date of Service: 06/27/2016 12:45 PM Medical Record Patient Account Number: [id number] Number: Treating RN: Baruch Gouty, RN, BSN, Rita [birth year] [phone] y.o. Other Clinician: Date of Birth/Sex: Female) Treating ROBSON, MICHAEL Primary Care Physician/Extender: Jeryl Columbia Physician: Referring Physician: Abelardo Diesel in Treatment: 1 Vital Signs Time Taken: 14:44 Temperature (F): 97.8 Height (in): 65 Pulse (bpm): 79 Weight (lbs): 184 Respiratory Rate (breaths/min): 18 Body Mass Index (BMI): 30.6 Blood Pressure (mmHg): 126/52 Reference Range: 80 - 120 mg / dl Electronic Signature(s) Signed: 06/27/2016 5:42:05 PM By: Regan Lemming BSN, RN Entered By: Regan Lemming on 06/27/2016 14:44:43

## 2016-06-28 NOTE — Progress Notes (Signed)
Hailey Ellison, Hailey Ellison ([id number]) Visit Report for 06/27/2016 Chief Complaint Document Details Patient Name: Hailey Ellison, Hailey Ellison. Date of Service: 06/27/2016 12:45 PM Medical Record Patient Account Number: [id number] Number: Treating RN: Baruch Gouty, RN, BSN, Rita 07-30-1935 [phone] y.o. Other Clinician: Date of Birth/Sex: Female) Treating ROBSON, MICHAEL Primary Care Physician/Extender: Jeryl Columbia Physician: Referring Physician: Abelardo Diesel in Treatment: 1 Information Obtained from: Patient Chief Complaint patient is hear for review of a draining sinus in the outer quadrant of her left breast Electronic Signature(s) Signed: 06/28/2016 7:30:59 AM By: Linton Ham MD Entered By: Linton Ham on 06/27/2016 17:54:16 Hailey Ellison ([id number]) -------------------------------------------------------------------------------- HPI Details Patient Name: Hailey Ellison, Hailey Ellison. Date of Service: 06/27/2016 12:45 PM Medical Record Patient Account Number: [id number] Number: Treating RN: Baruch Gouty, RN, BSN, Rita 01/12/1935 [phone] y.o. Other Clinician: Date of Birth/Sex: Female) Treating ROBSON, MICHAEL Primary Care Physician/Extender: Jeryl Columbia Physician: Referring Physician: Abelardo Diesel in Treatment: 1 History of Present Illness HPI Description: 06/19/16; This is a 81 year old woman who is here for review of a draining sinus of the left breast that she states has been present for over a year.The history she gives is that she had a lumpectomy of her left breast by Dr. Bary Castilla in 2010. She was treated with radiation and chemotherapy. She had multiple axillary nodes positive. At some point after this she was felt to have either re-occurence in the same site. She underwent three core biopsies and further surgery the latter of which finally showed her to be free of cancer. For about the last year she has had a small draining sinus in the left upper outer  quadrant. Drains mostly clear fluid, sometimes looking like puss per the patient. She denies pain, fever or other systemic symptoms. the drainage seems to come and go. She has had mammogram and Breast ultrasounds the latter of which suggested fat necrosis. She has apparently been offered further excision of the area by her surgeon however she is uncertain how to proceed. She has been using gauze over the area. She has tried some form of silver based gel and medihoney neither of which had any effect. The patient also has a history of fibrocystic breast disease. She was recently hospitalized for Acute Coronary Syndrome requiring a stent and a culture was done on 6/16 which showed coagulase negative staph and Diptheroids. According to the patient no antibiotics were given In reviewing [place].The patient's area was actually discovered in May 2016. 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 Her last mammogram on 5/12/17showed only benign surgical and radiation changes. Ultrasound of the left breast on 03/27/16 showed a 1.4x1.7 areas of scarring consistent when fat necrosis. There was apparently some area of palpable cancer 2 cm above this however the ultrasound showed only adipose tissue 06/27/16; culture I did last week once again showed coag-negative staph, given this I think it is reasonable to go ahead and treat this although I still have some degree of skepticism that it is actually a true pathogen. Nevertheless this is the second culture that showed coag-negative staph. I gave her a prescription for doxycycline. She tells me that the Iodosorb looked like at one point that it was closing the whole however leg the other topical ointments she is applied the drainage simply found its way to open the orifice once again Electronic Signature(s) Signed: 06/28/2016 7:30:59 AM  By: Linton Ham MD Entered By: Linton Ham on 06/27/2016 17:55:31 Hailey Ellison ([id number]) -------------------------------------------------------------------------------- Physical Exam Details Patient Name: Hailey Ellison, Hailey Ellison. Date of Service: 06/27/2016 12:45 PM Medical Record Patient Account Number: [id number] Number: Treating RN: Baruch Gouty, RN, BSN, Rita [birth year] [phone] y.o. Other Clinician: Date of Birth/Sex: Female) Treating ROBSON, MICHAEL Primary Care Physician/Extender: Jeryl Columbia Physician: Referring Physician: Abelardo Diesel in Treatment: 1 Constitutional Sitting or standing Blood Pressure is within target range for patient.. Pulse regular and within target range for patient.Marland Kitchen Respirations regular, non-labored and within target range.. Temperature is normal and within the target range for the patient.Marland Kitchen Respiratory Respiratory effort is easy and symmetric bilaterally. Rate is normal at rest and on room air.. Chest Breasts symmetrical and no nipple discharge.. Draining area in the left upper quadrant without palpable adenopathy or tenderness. Lymphatic Nonpalpable in the clavicular or axillary areas. Integumentary (Hair, Skin) No rashes are seen. No other abnormalities in the left breast there are obvious. Psychiatric No evidence of depression, anxiety, or agitation. Calm, cooperative, and communicative. Appropriate interactions and affect.. Notes Wound exam; small draining area in the left upper quadrant of the breast. No erythema and no tenderness but a considerable degree of drainage relative to the size of the wound which at some point looks purulent. Nevertheless there are no other palpable abnormalities Electronic Signature(s) Signed: 06/28/2016 7:30:59 AM By: Linton Ham MD Entered By: Linton Ham on 06/27/2016 17:57:07 Hailey Ellison  ([id number]) -------------------------------------------------------------------------------- Physician Orders Details Patient Name: Hailey Ellison, Hailey R. Date of Service: 06/27/2016 12:45 PM Medical Record Patient Account Number: [id number] Number: Treating RN: Baruch Gouty, RN, BSN, Rita 11-09-1935 [phone] y.o. Other Clinician: Date of Birth/Sex: Female) Treating ROBSON, MICHAEL Primary Care Physician/Extender: Jeryl Columbia Physician: Referring Physician: Abelardo Diesel in Treatment: 1 Verbal / Phone Orders: Yes Clinician: Afful, RN, BSN, Rita Read Back and Verified: Yes Diagnosis Coding Wound Cleansing Wound #1 Left Breast o Clean wound with Normal Saline. Primary Wound Dressing Wound #1 Left Breast o Iodosorb Ointment Secondary Dressing Wound #1 Left Breast o Boardered Foam Dressing Dressing Change Frequency Wound #1 Left Breast o Change dressing every day. Follow-up Appointments Wound #1 Left Breast o Return Appointment in 1 week. Electronic Signature(s) Signed: 06/27/2016 5:42:05 PM By: Regan Lemming BSN, RN Signed: 06/28/2016 7:30:59 AM By: Linton Ham MD Entered By: Regan Lemming on 06/27/2016 14:54:48 Hailey Ellison, Hailey Ellison ([id number]) -------------------------------------------------------------------------------- Problem List Details Patient Name: Hailey Ellison, Hailey Ellison. Date of Service: 06/27/2016 12:45 PM Medical Record Patient Account Number: [id number] Number: Treating RN: Baruch Gouty, RN, BSN, Rita 06-07-1935 [phone] y.o. 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 Date of Service: 06/27/2016 12:45 PM Medical Record Patient Account Number: [id number] Number: Treating RN: Baruch Gouty, RN, BSN, Rita 01-Jun-1935 [phone] y.o. Other Clinician: Date of Birth/Sex: Female) Treating ROBSON, MICHAEL Primary Care Physician/Extender: Jeryl Columbia Physician: Referring Physician: Abelardo Diesel in Treatment: 1 Subjective Chief Complaint Information obtained from Patient patient is hear for  review of a draining sinus in the outer quadrant of her left breast History of Present Illness (HPI) 06/19/16; This is a 81 year old woman who is here for review of a draining sinus of the left breast that she states has been present for over a year.The history she gives is that she had a lumpectomy of her left breast by Dr. Bary Castilla in 2010. She was treated with radiation and chemotherapy. She had multiple axillary nodes positive. At some point after this she was felt to have either re-occurence in the same site. She underwent three core biopsies and further surgery the latter of which finally showed her to be free of cancer. For about the last year she has had a small draining sinus in the left upper outer quadrant. Drains mostly clear fluid, sometimes looking like puss per the patient. She denies pain, fever or other systemic symptoms. the drainage seems to come and go. She has had mammogram and Breast ultrasounds the latter of which suggested fat necrosis. She has apparently been offered further excision of the area by her surgeon however she is uncertain how to proceed. She has  been using gauze over the area. She has tried some form of silver based gel and medihoney neither of which had any effect. The patient also has a history of fibrocystic breast disease. She was recently hospitalized for Acute Coronary Syndrome requiring a stent and a culture was done on 6/16 which showed coagulase negative staph and Diptheroids. According to the patient no antibiotics were given In reviewing [place].The patient's area was actually discovered in May 2016. 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 Her last mammogram on 5/12/17showed only benign surgical and radiation changes. Ultrasound of the left breast on 03/27/16 showed a 1.4x1.7 areas of scarring consistent when fat necrosis. There was apparently some area of palpable cancer 2 cm above this however the ultrasound showed only adipose tissue 06/27/16; culture I did last week once again showed coag-negative staph, given this I think it is reasonable to go ahead and treat this although I still have some degree of skepticism that it is actually a true pathogen. Nevertheless this is the second culture that showed coag-negative staph. I gave her a prescription for doxycycline. She tells me that the Iodosorb looked like at one point that it was closing the whole however leg the other topical ointments she is applied the drainage simply found its way to open the orifice once again Hailey Ellison, Hailey R. ([id number]) Objective Constitutional Sitting or standing Blood Pressure is within target range for patient.. Pulse regular and within target range for patient.Marland Kitchen Respirations regular, non-labored and within target range.. Temperature is normal and within the target range for the patient.. Vitals Time Taken: 2:44 PM, Height: 65 in, Weight: 184 lbs, BMI: 30.6, Temperature: 97.8 F, Pulse: 79 bpm, Respiratory Rate:  18 breaths/min, Blood Pressure: 126/52 mmHg. Respiratory Respiratory effort is easy and symmetric bilaterally. Rate is normal at rest and on room air.. Chest Breasts symmetrical and no nipple discharge.. Draining area in the left upper quadrant without palpable adenopathy or tenderness. Lymphatic Nonpalpable in the clavicular or axillary areas. Psychiatric No evidence of depression, anxiety, or agitation. Calm, cooperative, and communicative. Appropriate interactions and affect.. General Notes: Wound exam; small draining area in the left upper quadrant of the breast. No erythema and no tenderness but a considerable degree of drainage relative  to the size of the wound which at some point looks purulent. Nevertheless there are no other palpable abnormalities Integumentary (Hair, Skin) No rashes are seen. No other abnormalities in the left breast there are obvious. Wound #1 status is Open. Original cause of wound was Surgical Injury. The wound is located on the Left Breast. The wound measures 0.1cm length x 0.1cm width x 0.1cm depth; 0.008cm^2 area and 0.001cm^3 volume. The wound is limited to skin breakdown. There is no tunneling noted. There is a medium amount of serous drainage noted. The wound margin is indistinct and nonvisible. There is large (67-100%) pink granulation within the wound bed. There is no necrotic tissue within the wound bed. The periwound skin appearance exhibited: Moist. The periwound skin appearance did not exhibit: Callus, Crepitus, Excoriation, Fluctuance, Friable, Induration, Localized Edema, Rash, Scarring, Dry/Scaly, Maceration, Atrophie Blanche, Cyanosis, Ecchymosis, Hemosiderin Staining, Mottled, Pallor, Rubor, Erythema. Periwound temperature was noted as No Abnormality. Hailey Ellison, Hailey Ellison ([id number]) Assessment Active Problems ICD-10 S21.002D - Unspecified open wound of left breast, subsequent encounter N64.1 - Fat necrosis of breast C50.412 - Malignant  neoplasm of upper-outer quadrant of left female breast Plan Wound Cleansing: Wound #1 Left Breast: Clean wound with Normal Saline. Primary Wound Dressing: Wound #1 Left Breast: Iodosorb Ointment Secondary Dressing: Wound #1 Left Breast: Boardered Foam Dressing Dressing Change Frequency: Wound #1 Left Breast: Change dressing every day. Follow-up Appointments: Wound #1 Left Breast: Return Appointment in 1 week. #1 there is very little choice about what to applied to the draining sinus, I continue the Iodosorb #2 dicloxacillin 500 mg every 6 hours for one week for the coag-negative staph #3 I think this is likely fat necrosis rather than any infection. She is been offered another procedure by her surgeon and if I am not progressing towards any obvious resolution of this draining sinus I would suggest that she take him up on his offer of opening this area with debridement. Hailey Ellison, KUEHNLE ([id number]) #4 although the patient has a history of breast cancer in the same breast, this site was actually a squamous cell carcinoma the skin which required multiple biopsies to ensure surgical resolution. There is no current palpable abnormality, recent imaging studies of the breast did not show malignancy Electronic Signature(s) Signed: 06/28/2016 7:30:59 AM By: Linton Ham MD Entered By: Linton Ham on 06/27/2016 [place], [place]. ([id number]) -------------------------------------------------------------------------------- [place] Details Patient Name: Hailey Ellison. Date of Service: 06/27/2016 Medical Record Patient Account Number: [id number] Number: Treating RN: Baruch Gouty, RN, BSN, Rita 1935/10/06 [phone] y.o. 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

## 2016-07-11 ENCOUNTER — Encounter: Payer: Self-pay | Admitting: *Deleted

## 2016-07-11 ENCOUNTER — Encounter: Payer: Medicare Other | Admitting: Internal Medicine

## 2016-07-11 DIAGNOSIS — Z955 Presence of coronary angioplasty implant and graft: Secondary | ICD-10-CM

## 2016-07-11 DIAGNOSIS — I213 ST elevation (STEMI) myocardial infarction of unspecified site: Secondary | ICD-10-CM

## 2016-07-11 DIAGNOSIS — C50412 Malignant neoplasm of upper-outer quadrant of left female breast: Secondary | ICD-10-CM | POA: Diagnosis not present

## 2016-07-11 NOTE — Progress Notes (Signed)
Cardiac Individual Treatment Plan  Patient Details  Name: Hailey Ellison MRN: [id number] Date of Birth: Nov 10, 1935 Referring Provider:        Cardiac Rehab from 06/25/2016 in [facility] Cardiac and Pulmonary Rehab   Referring Provider  Arida      Initial Encounter Date:       Cardiac Rehab from 06/25/2016 in [facility] Re[facility]   Date  06/25/16   Referring Provider  Fletcher Anon      Visit Diagnosis: ST elevation myocardial infarction (STEMI), unspecified artery ([name])  Status post coronary artery stent placement  Patient's Home Medications on Admission:  Current outpatient prescriptions:  .  acetaminophen (TYLENOL) 650 MG CR tablet, Take by mouth., Disp: , Rfl:  .  anastrozole (ARIMIDEX) 1 MG tablet, Take 1 mg by mouth daily., Disp: , Rfl:  .  aspirin 81 MG chewable tablet, Chew 1 tablet (81 mg total) by mouth daily., Disp: 30 tablet, Rfl: 2 .  atorvastatin (LIPITOR) 40 MG tablet, Take 1 tablet (40 mg total) by mouth at bedtime., Disp: 30 tablet, Rfl: 2 .  calcium citrate-vitamin D (CITRACAL+D) 315-200 MG-UNIT per tablet, Take 1 tablet by mouth 2 (two) times daily., Disp: , Rfl:  .  dipyridamole-aspirin (AGGRENOX) 200-25 MG per 12 hr capsule, Take 1 capsule by mouth 2 (two) times daily. Reported on 06/25/2016, Disp: , Rfl:  .  furosemide (LASIX) 20 MG tablet, Take 20 mg by mouth daily as needed for fluid. , Disp: , Rfl:  .  glucosamine-chondroitin 500-400 MG tablet, Take 2 tablets by mouth daily., Disp: , Rfl:  .  hydrochlorothiazide (HYDRODIURIL) 25 MG tablet, Take 0.5 tablets (12.5 mg total) by mouth daily. (Patient not taking: Reported on 06/25/2016), Disp: , Rfl:  .  lansoprazole (PREVACID) 30 MG capsule, Take 30 mg by mouth daily., Disp: , Rfl:  .  Multiple Vitamin (MULTIVITAMIN WITH MINERALS) TABS, Take 1 tablet by mouth daily., Disp: , Rfl:  .  PARoxetine (PAXIL) 40 MG tablet, Take 40 mg by mouth daily., Disp: , Rfl:  .  ticagrelor (BRILINTA) 90 MG TABS tablet, Take 1 tablet (90 mg  total) by mouth 2 (two) times daily., Disp: 60 tablet, Rfl: 0 .  valsartan (DIOVAN) 320 MG tablet, Take 320 mg by mouth daily., Disp: , Rfl:  .  verapamil (CALAN) 120 MG tablet, Take 60 mg by mouth daily., Disp: , Rfl:   Past Medical History: Past Medical History  Diagnosis Date  . Hypertension   . Murmur   . Radiation 2010    with chemo  . Arthritis   . Lymphedema   . Chronic kidney disease 2014  . Cancer ([place]) 2010    T1c,N2a, M0; ER 90%, PR 60%,  HER-2/neu 2+ IHC, equivocal on fish. 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  Margins clear.  . Breast cancer ([place])     Adjuvant chemotherapy with Taxotere/Carboplatin/Herceptin. Arimidex initiated November 2011  . Squamous cell carcinoma [facility]) June 2016    Left upper inner chest wall, area of previous radiation.    Tobacco Use: History  Smoking status  . Former Smoker  Smokeless tobacco  . Never Used    Labs: Recent [employer] for ITP Cardiac and Pulmonary Rehab Latest Ref Rng 06/08/2016   Cholestrol 0 - 200 mg/dL 135   LDLCALC 0 - 99 mg/dL 81   HDL >40  mg/dL 47   Trlycerides <150 mg/dL 37       Exercise Target Goals:    Exercise Program Goal: Individual exercise prescription set with THRR, safety & activity barriers. Participant demonstrates ability to understand and report RPE using BORG scale, to self-measure pulse accurately, and to acknowledge the importance of the exercise prescription.  Exercise Prescription Goal: Starting with aerobic activity 30 plus minutes a day, 3 days per week for initial exercise prescription. Provide home exercise prescription and guidelines that participant acknowledges understanding prior to discharge.  Activity Barriers & Risk Stratification:     Activity Barriers & Cardiac Risk Stratification - 06/25/16 1458    Activity Barriers & Cardiac  Risk Stratification   Activity Barriers Joint Problems;Back Problems;Shortness of Breath;Deconditioning  Was to have left knee replacement and had heart attack week before scheduled surgery.  BAck problems are old and no concerns at present. Will let staff know if exercise flares back . SOB with exertion. Questions if may be deconditioning. Has osteoporosis   Cardiac Risk Stratification High      6 Minute Walk:     6 Minute Walk      06/25/16 1424       6 Minute Walk   Phase Initial     Distance 900 feet     Walk Time 6 minutes     MPH 1.7     METS 1.5     RPE 13     VO2 Peak 5.3     Symptoms No     Resting HR 81 bpm     Resting BP 110/60 mmHg     Max Ex. HR 110 bpm     Max Ex. BP 118/56 mmHg        Initial Exercise Prescription:     Initial Exercise Prescription - 06/25/16 1400    Date of Initial Exercise RX and Referring Provider   Date 06/25/16   Referring Provider Arida   Recumbant Bike   Level 1   RPM 50   Watts 20   Minutes 15   NuStep   Level 1   Watts 25   Minutes 15   T5 Nustep   Level 1   Watts 25   Minutes 15   Biostep-RELP   Level 1   Watts 25   Minutes 15   Prescription Details   Frequency (times per week) 3   Duration Progress to 30 minutes of continuous aerobic without signs/symptoms of physical distress   Intensity   THRR 40-80% of Max Heartrate 105-128   Ratings of Perceived Exertion 11-13   Perceived Dyspnea 0-4   Progression   Progression Continue to progress workloads to maintain intensity without signs/symptoms of physical distress.   Resistance Training   Training Prescription Yes   Weight 1   Reps 10-15      Perform Capillary Blood Glucose checks as needed.  Exercise Prescription Changes:     Exercise Prescription Changes      06/25/16 1400           Response to Exercise   Blood Pressure (Admit) 110/60 mmHg       Blood Pressure (Exercise) 118/56 mmHg       Blood Pressure (Exit) 94/58 mmHg       Heart Rate  (Admit) 81 bpm       Heart Rate (Exercise) 110 bpm       Heart Rate (Exit) 73 bpm       Rating of Perceived Exertion (  Exercise) 13          Exercise Comments:   Discharge Exercise Prescription (Final Exercise Prescription Changes):     Exercise Prescription Changes - 06/25/16 1400    Response to Exercise   Blood Pressure (Admit) 110/60 mmHg   Blood Pressure (Exercise) 118/56 mmHg   Blood Pressure (Exit) 94/58 mmHg   Heart Rate (Admit) 81 bpm   Heart Rate (Exercise) 110 bpm   Heart Rate (Exit) 73 bpm   Rating of Perceived Exertion (Exercise) 13      Nutrition:  Target Goals: Understanding of nutrition guidelines, daily intake of sodium '1500mg'$ , cholesterol '200mg'$ , calories 30% from fat and 7% or less from saturated fats, daily to have 5 or more servings of fruits and vegetables.  Biometrics:     Pre Biometrics - 06/25/16 1423    Pre Biometrics   Height 5' 4.5" (1.638 m)   Weight 184 lb 6.4 oz (83.643 kg)   Waist Circumference 42.5 inches   Hip Circumference 47.25 inches   Waist to Hip Ratio 0.9 %   BMI (Calculated) 31.2   Single Leg Stand 1.32 seconds       Nutrition Therapy Plan and Nutrition Goals:     Nutrition Therapy & Goals - 06/25/16 1451    Intervention Plan   Intervention Prescribe, educate and counsel regarding individualized specific dietary modifications aiming towards targeted core components such as weight, hypertension, lipid management, diabetes, heart failure and other comorbidities.   Expected Outcomes Short Term Goal: Understand basic principles of dietary content, such as calories, fat, sodium, cholesterol and nutrients.;Short Term Goal: A plan has been developed with personal nutrition goals set during dietitian appointment.;Long Term Goal: Adherence to prescribed nutrition plan.      Nutrition Discharge: Rate Your Plate Scores:   Nutrition Goals Re-Evaluation:   Psychosocial: Target Goals: Acknowledge presence or absence of depression,  maximize coping skills, provide positive support system. Participant is able to verbalize types and ability to use techniques and skills needed for reducing stress and depression.  Initial Review & Psychosocial Screening:     Initial Psych Review & Screening - 06/25/16 1452    Initial Review   Current issues with Current Depression;Current Psychotropic Meds   Family Dynamics   Good Support System? Yes  2 daughters.  Husband died in 2013-10-18   Barriers   Psychosocial barriers to participate in program There are no identifiable barriers or psychosocial needs.;The patient should benefit from training in stress management and relaxation.   Screening Interventions   Interventions Encouraged to exercise      Quality of Life Scores:     Quality of Life - 06/25/16 1457    Quality of Life Scores   Health/Function Pre 24.32 %   Socioeconomic Pre 29.58 %   Psych/Spiritual Pre 30 %   Family Pre 28.5 %   GLOBAL Pre 27.16 %      PHQ-9:     Recent Review Flowsheet Data    Depression screen [facility] 2/9 06/25/2016   Decreased Interest 1   Down, Depressed, Hopeless 0   PHQ - 2 Score 1   Altered sleeping 1   Tired, decreased energy 2   Change in appetite 3   Feeling bad or failure about yourself  0   Trouble concentrating 0   Moving slowly or fidgety/restless 0   PHQ-9 Score 7   Difficult doing work/chores Somewhat difficult      Psychosocial Evaluation and Intervention:   Psychosocial Re-Evaluation:  Vocational Rehabilitation: Provide vocational rehab assistance to qualifying candidates.   Vocational Rehab Evaluation & Intervention:     Vocational Rehab - 06/25/16 1502    Initial Vocational Rehab Evaluation & Intervention   Assessment shows need for Vocational Rehabilitation No      Education: Education Goals: Education classes will be provided on a weekly basis, covering required topics. Participant will state understanding/return demonstration of topics  presented.  Learning Barriers/Preferences:     Learning Barriers/Preferences - 06/25/16 1501    Learning Barriers/Preferences   Learning Barriers None   Learning Preferences Verbal Instruction;Written Material      Education Topics: General Nutrition Guidelines/Fats and Fiber: -Group instruction provided by verbal, written material, models and posters to present the general guidelines for heart healthy nutrition. Gives an explanation and review of dietary fats and fiber.   Controlling Sodium/Reading Food Labels: -Group verbal and written material supporting the discussion of sodium use in heart healthy nutrition. Review and explanation with models, verbal and written materials for utilization of the food label.   Exercise Physiology & Risk Factors: - Group verbal and written instruction with models to review the exercise physiology of the cardiovascular system and associated critical values. Details cardiovascular disease risk factors and the goals associated with each risk factor.   Aerobic Exercise & Resistance Training: - Gives group verbal and written discussion on the health impact of inactivity. On the components of aerobic and resistive training programs and the benefits of this training and how to safely progress through these programs.   Flexibility, Balance, General Exercise Guidelines: - Provides group verbal and written instruction on the benefits of flexibility and balance training programs. Provides general exercise guidelines with specific guidelines to those with heart or lung disease. Demonstration and skill practice provided.   Stress Management: - Provides group verbal and written instruction about the health risks of elevated stress, cause of high stress, and healthy ways to reduce stress.   Depression: - Provides group verbal and written instruction on the correlation between heart/lung disease and depressed mood, treatment options, and the stigmas  associated with seeking treatment.   Anatomy & Physiology of the Heart: - Group verbal and written instruction and models provide basic cardiac anatomy and physiology, with the coronary electrical and arterial systems. Review of: AMI, Angina, Valve disease, Heart Failure, Cardiac Arrhythmia, Pacemakers, and the ICD.   Cardiac Procedures: - Group verbal and written instruction and models to describe the testing methods done to diagnose heart disease. Reviews the outcomes of the test results. Describes the treatment choices: Medical Management, Angioplasty, or Coronary Bypass Surgery.   Cardiac Medications: - Group verbal and written instruction to review commonly prescribed medications for heart disease. Reviews the medication, class of the drug, and side effects. Includes the steps to properly store meds and maintain the prescription regimen.   Go Sex-Intimacy & Heart Disease, Get SMART - Goal Setting: - Group verbal and written instruction through game format to discuss heart disease and the return to sexual intimacy. Provides group verbal and written material to discuss and apply goal setting through the application of the S.M.A.R.T. Method.   Other Matters of the Heart: - Provides group verbal, written materials and models to describe Heart Failure, Angina, Valve Disease, and Diabetes in the realm of heart disease. Includes description of the disease process and treatment options available to the cardiac patient.   Exercise & Equipment Safety: - Individual verbal instruction and demonstration of equipment use and safety with use of the equipment.  Cardiac Rehab from 06/25/2016 in [facility] Rehab   Date  06/25/16   Educator  SB   Instruction Review Code  2- meets goals/outcomes      Infection Prevention: - Provides verbal and written material to individual with discussion of infection control including proper hand washing and proper equipment cleaning  during exercise session.      Cardiac Rehab from 06/25/2016 in [facility] Cardiac and Pulmonary Rehab   Date  06/25/16   Educator  SB   Instruction Review Code  2- meets goals/outcomes      Falls Prevention: - Provides verbal and written material to individual with discussion of falls prevention and safety.   Diabetes: - Individual verbal and written instruction to review signs/symptoms of diabetes, desired ranges of glucose level fasting, after meals and with exercise. Advice that pre and post exercise glucose checks will be done for 3 sessions at entry of program.    Knowledge Questionnaire Score:     Knowledge Questionnaire Score - 06/25/16 1501    Knowledge Questionnaire Score   Pre Score 21/27      Core Components/Risk Factors/Patient Goals at Admission:     Personal Goals and Risk Factors at Admission - 06/25/16 1451    Core Components/Risk Factors/Patient Goals on Admission    Weight Management Obesity;Yes   Intervention Weight Management: Develop a combined nutrition and exercise program designed to reach desired caloric intake, while maintaining appropriate intake of nutrient and fiber, sodium and fats, and appropriate energy expenditure required for the weight goal.;Weight Management: Provide education and appropriate resources to help participant work on and attain dietary goals.;Weight Management/Obesity: Establish reasonable short term and long term weight goals.   Expected Outcomes Short Term: Continue to assess and modify interventions until short term weight is achieved;Long Term: Adherence to nutrition and physical activity/exercise program aimed toward attainment of established weight goal;Weight Loss: Understanding of general recommendations for a balanced deficit meal plan, which promotes 1-2 lb weight loss per week and includes a negative energy balance of [phone] kcal/d   Sedentary Yes   Intervention Provide advice, education, support and counseling about physical  activity/exercise needs.;Develop an individualized exercise prescription for aerobic and resistive training based on initial evaluation findings, risk stratification, comorbidities and participant's personal goals.   Expected Outcomes Achievement of increased cardiorespiratory fitness and enhanced flexibility, muscular endurance and strength shown through measurements of functional capacity and personal statement of participant.   Increase Strength and Stamina Yes   Intervention Provide advice, education, support and counseling about physical activity/exercise needs.;Develop an individualized exercise prescription for aerobic and resistive training based on initial evaluation findings, risk stratification, comorbidities and participant's personal goals.   Expected Outcomes Achievement of increased cardiorespiratory fitness and enhanced flexibility, muscular endurance and strength shown through measurements of functional capacity and personal statement of participant.   Hypertension Yes   Intervention Provide education on lifestyle modifcations including regular physical activity/exercise, weight management, moderate sodium restriction and increased consumption of fresh fruit, vegetables, and low fat dairy, alcohol moderation, and smoking cessation.;Monitor prescription use compliance.   Expected Outcomes Short Term: Continued assessment and intervention until BP is < 140/16m HG in hypertensive participants. < 130/89mHG in hypertensive participants with diabetes, heart failure or chronic kidney disease.;Long Term: Maintenance of blood pressure at goal levels.   Lipids Yes   Intervention Provide education and support for participant on nutrition & aerobic/resistive exercise along with prescribed medications to achieve LDL '70mg'$ , HDL >'40mg'$ .   Expected Outcomes Short Term:  Participant states understanding of desired cholesterol values and is compliant with medications prescribed. Participant is following  exercise prescription and nutrition guidelines.;Long Term: Cholesterol controlled with medications as prescribed, with individualized exercise RX and with personalized nutrition plan. Value goals: LDL < '70mg'$ , HDL > 40 mg.      Core Components/Risk Factors/Patient Goals Review:    Core Components/Risk Factors/Patient Goals at Discharge (Final Review):    ITP Comments:     ITP Comments      06/25/16 1443 07/11/16 0818         ITP Comments Medical review completed today with ITP created.  Continue with ITP. Documentation of  diagnosis found [facility] 06/10/2016 30 day review. Continue with ITP         Comments:

## 2016-07-12 NOTE — Progress Notes (Signed)
MONEKE, LINCICOME ([id number]) Visit Report for 07/11/2016 Arrival Information Details Patient Name: Hailey Ellison, Hailey Ellison. Date of Service: 07/11/2016 1:30 PM Medical Record Patient Account Number: [id number] Number: Treating RN: Ahmed Prima November 26, 1935 (81 y.o. Other Clinician: Date of Birth/Sex: Female) Treating ROBSON, MICHAEL Primary Care Physician/Extender: Jeryl Columbia Physician: Referring Physician: Abelardo Diesel in Treatment: 3 Visit Information History Since Last Visit All ordered tests and consults were completed: No Patient Arrived: Ambulatory Added or deleted any medications: No Arrival Time: 13:50 Any new allergies or adverse reactions: No Accompanied By: self Had a fall or experienced change in No Transfer Assistance: None activities of daily living that may affect Patient Identification Verified: Yes risk of falls: Secondary Verification Process Yes Signs or symptoms of abuse/neglect since last No Completed: visito Patient Requires Transmission- No Hospitalized since last visit: No Based Precautions: Has Dressing in Place as Prescribed: Yes Patient Has Alerts: Yes Pain Present Now: No Patient Alerts: Patient on Blood Thinner Aspirin, Brilinta Electronic Signature(s) Signed: 07/12/2016 10:11:37 AM By: Catalina Lunger Entered By: Catalina Lunger on 07/11/2016 13:52:03 [name] ([id number]) -------------------------------------------------------------------------------- Clinic Level of Care Assessment Details Patient Name: Hailey Ellison, Hailey Ellison. Date of Service: 07/11/2016 1:30 PM Medical Record Patient Account Number: [id number] Number: Treating RN: Ahmed Prima 01-10-1935 (81 y.o. Other Clinician: Date of Birth/Sex: Female) Treating ROBSON, MICHAEL Primary Care Physician/Extender: Jeryl Columbia Physician: Referring Physician: Abelardo Diesel in Treatment: 3 Clinic Level of Care Assessment  Items TOOL 4 Quantity Score X - Use when only an EandM is performed on FOLLOW-UP visit 1 0 ASSESSMENTS - Nursing Assessment / Reassessment X - Reassessment of Co-morbidities (includes updates in patient status) 1 10 X - Reassessment of Adherence to Treatment Plan 1 5 ASSESSMENTS - Wound and Skin Assessment / Reassessment X - Simple Wound Assessment / Reassessment - one wound 1 5 []  - Complex Wound Assessment / Reassessment - multiple wounds 0 []  - Dermatologic / Skin Assessment (not related to wound area) 0 ASSESSMENTS - Focused Assessment []  - Circumferential Edema Measurements - multi extremities 0 []  - Nutritional Assessment / Counseling / Intervention 0 []  - Lower Extremity Assessment (monofilament, tuning fork, pulses) 0 []  - Peripheral Arterial Disease Assessment (using hand held doppler) 0 ASSESSMENTS - Ostomy and/or Continence Assessment and Care []  - Incontinence Assessment and Management 0 []  - Ostomy Care Assessment and Management (repouching, etc.) 0 PROCESS - Coordination of Care X - Simple Patient / Family Education for ongoing care 1 15 []  - Complex (extensive) Patient / Family Education for ongoing care 0 []  - Staff obtains Consents, Records, Test Results / Process Orders 0 AURYN, SELNER ([id number]) []  - Staff telephones HHA, Nursing Homes / Clarify orders / etc 0 []  - Routine Transfer to another Facility (non-emergent condition) 0 []  - Routine Hospital Admission (non-emergent condition) 0 []  - New Admissions / Biomedical engineer / Ordering NPWT, Apligraf, etc. 0 []  - Emergency Hospital Admission (emergent condition) 0 []  - Simple Discharge Coordination 0 X - Complex (extensive) Discharge Coordination 1 15 PROCESS - Special Needs []  - Pediatric / Minor Patient Management 0 []  - Isolation Patient Management 0 []  - Hearing / Language / Visual special needs 0 []  - Assessment of Community assistance (transportation, D/C planning, etc.) 0 []  - Additional  assistance / Altered mentation 0 []  - Support Surface(s) Assessment (bed, cushion, seat, etc.) 0 INTERVENTIONS - Wound Cleansing / Measurement X - Simple Wound Cleansing - one wound 1 5 []  - Complex Wound  Cleansing - multiple wounds 0 X - Wound Imaging (photographs - any number of wounds) 1 5 []  - Wound Tracing (instead of photographs) 0 X - Simple Wound Measurement - one wound 1 5 []  - Complex Wound Measurement - multiple wounds 0 INTERVENTIONS - Wound Dressings X - Small Wound Dressing one or multiple wounds 1 10 []  - Medium Wound Dressing one or multiple wounds 0 []  - Large Wound Dressing one or multiple wounds 0 []  - Application of Medications - topical 0 []  - Application of Medications - injection 0 Hailey Ellison, Hailey R. ([id number]) INTERVENTIONS - Miscellaneous []  - External ear exam 0 []  - Specimen Collection (cultures, biopsies, blood, body fluids, etc.) 0 []  - Specimen(s) / Culture(s) sent or taken to Lab for analysis 0 []  - Patient Transfer (multiple staff / Harrel Lemon Lift / Similar devices) 0 []  - Simple Staple / Suture removal (25 or less) 0 []  - Complex Staple / Suture removal (26 or more) 0 []  - Hypo / Hyperglycemic Management (close monitor of Blood Glucose) 0 []  - Ankle / Brachial Index (ABI) - do not check if billed separately 0 X - Vital Signs 1 5 Has the patient been seen at the hospital within the last three years: Yes Total Score: 80 Level Of Care: New/Established - Level 3 Electronic Signature(s) Signed: 07/11/2016 5:02:09 PM By: Alric Quan Entered By: Alric Quan on 07/11/2016 [place], [place]. ([id number]) -------------------------------------------------------------------------------- Encounter Discharge Information Details Patient Name: Hailey Ellison, Hailey R. Date of Service: 07/11/2016 1:30 PM Medical Record Patient Account Number: [id number] Number: Treating RN: Ahmed Prima [birth year] (81 y.o. Other Clinician: Date of  Birth/Sex: Female) Treating ROBSON, MICHAEL Primary Care Physician/Extender: Jeryl Columbia Physician: Referring Physician: Abelardo Diesel in Treatment: 3 Encounter Discharge Information Items Discharge Pain Level: 0 Discharge Condition: Stable Ambulatory Status: Ambulatory Discharge Destination: Home Transportation: Private Auto Accompanied By: self Schedule Follow-up Appointment: Yes Medication Reconciliation completed and provided to Patient/Care Yes Chesnee Floren: Provided on Clinical Summary of Care: 07/11/2016 Form Type Recipient Paper Patient SK Electronic Signature(s) Signed: 07/12/2016 10:11:37 AM By: Catalina Lunger Previous Signature: 07/11/2016 2:21:17 PM Version By: Ruthine Dose Entered By: Catalina Lunger on 07/11/2016 14:26:44 [name] ([id number]) -------------------------------------------------------------------------------- Lower Extremity Assessment Details Patient Name: SREEYA, ROSSITER R. Date of Service: 07/11/2016 1:30 PM Medical Record Patient Account Number: [id number] Number: Treating RN: Ahmed Prima 08/16/1935 (80 y.o. Other Clinician: Date of Birth/Sex: Female) Treating ROBSON, MICHAEL Primary Care Physician/Extender: Jeryl Columbia Physician: Referring Physician: Abelardo Diesel in Treatment: 3 Electronic Signature(s) Signed: 07/11/2016 5:02:09 PM By: Alric Quan Signed: 07/12/2016 10:11:37 AM By: Catalina Lunger Entered By: Catalina Lunger on 07/11/2016 13:53:21 Hailey Ellison, Hailey R. ([id number]) -------------------------------------------------------------------------------- Multi Wound Chart Details Patient Name: Hailey Ellison, Hailey R. Date of Service: 07/11/2016 1:30 PM Medical Record Patient Account Number: [id number] Number: Treating RN: Ahmed Prima [birth year] (80 y.o. Other Clinician: Date of Birth/Sex: Female) Treating ROBSON, MICHAEL Primary Care Physician/Extender: Jeryl Columbia Physician: Referring Physician: Abelardo Diesel in Treatment: 3 Vital Signs Height(in): 65 Pulse(bpm): 72 Weight(lbs): 184 Blood Pressure 109/66 (mmHg): Body Mass Index(BMI): 31 Temperature(F): 97.9 Respiratory Rate 20 (breaths/min): Photos: [1:No Photos] [N/A:N/A] Wound Location: [1:Left Breast] [N/A:N/A] Wounding Event: [1:Surgical Injury] [N/A:N/A] Primary Etiology: [1:Abscess] [N/A:N/A] Comorbid History: [1:Coronary Artery Disease, Hypertension, Myocardial Infarction, Osteoarthritis, Received Chemotherapy, Received Radiation] [N/A:N/A] Date Acquired: [1:05/25/2015] [N/A:N/A] Weeks of Treatment: [1:3] [N/A:N/A] Wound Status: [1:Open] [N/A:N/A] Measurements L x W x D 0.1x0.1x0.1 [N/A:N/A] (cm) Area (cm) : [1:0.008] [N/A:N/A] Volume (cm) : [  1:0.001] [N/A:N/A] % Reduction in Area: [1:0.00%] [N/A:N/A] % Reduction in Volume: 0.00% [N/A:N/A] Classification: [1:Partial Thickness] [N/A:N/A] Exudate Amount: [1:Large] [N/A:N/A] Exudate Type: [1:Serous] [N/A:N/A] Exudate Color: [1:amber] [N/A:N/A] Wound Margin: [1:Indistinct, nonvisible] [N/A:N/A] Granulation Amount: [1:Large (67-100%)] [N/A:N/A] Granulation Quality: [1:Pink] [N/A:N/A] Necrotic Amount: [1:None Present (0%)] [N/A:N/A] Exposed Structures: [N/A:N/A] Fascia: No Fat: No Tendon: No Muscle: No Joint: No Bone: No Limited to Skin Breakdown Epithelialization: None N/A N/A Periwound Skin Texture: Edema: No N/A N/A Excoriation: No Induration: No Callus: No Crepitus: No Fluctuance: No Friable: No Rash: No Scarring: No Periwound Skin Moist: Yes N/A N/A Moisture: Maceration: No Dry/Scaly: No Periwound Skin Color: Atrophie Blanche: No N/A N/A Cyanosis: No Ecchymosis: No Erythema: No Hemosiderin Staining: No Mottled: No Pallor: No Rubor: No Temperature: No Abnormality N/A N/A Tenderness on No N/A N/A Palpation: Wound Preparation: Ulcer Cleansing: N/A N/A Rinsed/Irrigated  with Saline Topical Anesthetic Applied: None Treatment Notes Electronic Signature(s) Signed: 07/12/2016 10:11:37 AM By: Catalina Lunger Entered By: Catalina Lunger on 07/11/2016 13:55:49 [name] ([id number]) -------------------------------------------------------------------------------- [place] Details Patient Name: Hailey Ellison, Hailey R. Date of Service: 07/11/2016 1:30 PM Medical Record Patient Account Number: [id number] Number: Treating RN: Ahmed Prima 1935-11-10 (80 y.o. Other Clinician: Date of Birth/Sex: Female) Treating ROBSON, MICHAEL Primary Care Physician/Extender: Jeryl Columbia Physician: Referring Physician: Abelardo Diesel in Treatment: 3 Active Inactive Electronic Signature(s) Signed: 07/11/2016 5:02:09 PM By: Alric Quan Signed: 07/12/2016 10:11:37 AM By: Catalina Lunger Entered By: Catalina Lunger on 07/11/2016 14:24:21 [name] ([id number]) -------------------------------------------------------------------------------- Pain Assessment Details Patient Name: Hailey Ellison, MUSGRAVES. Date of Service: 07/11/2016 1:30 PM Medical Record Patient Account Number: [id number] Number: Treating RN: Ahmed Prima 10-Sep-1935 (80 y.o. Other Clinician: Date of Birth/Sex: Female) Treating ROBSON, MICHAEL Primary Care Physician/Extender: Jeryl Columbia Physician: Referring Physician: Abelardo Diesel in Treatment: 3 Active Problems Location of Pain Severity and Description of Pain Patient Has Paino No Site Locations Rate the pain. Current Pain Level: 0 Pain Management and Medication Current Pain Management: Electronic Signature(s) Signed: 07/11/2016 5:02:09 PM By: Alric Quan Signed: 07/12/2016 10:11:37 AM By: Catalina Lunger Entered By: Catalina Lunger on 07/11/2016 13:52:32 Hailey Ellison, Hailey Ellison  ([id number]) -------------------------------------------------------------------------------- Patient/Caregiver Education Details Patient Name: Hailey Ellison, Hailey R. Date of Service: 07/11/2016 1:30 PM Medical Record Patient Account Number: [id number] Number: Treating RN: Ahmed Prima Mar 26, 1935 (80 y.o. Other Clinician: Date of Birth/Gender: Female) Treating ROBSON, MICHAEL Primary Care Physician/Extender: Jeryl Columbia Physician: Suella [place] in Treatment: 3 Referring Physician: [name] Education Assessment Education Provided To: Patient Education Topics Provided Wound/Skin Impairment: Other: call clinic if any significant changes are seen; follow-up with surgeon per Dr. Janalyn Rouse Handouts: advice Methods: Explain/Verbal Responses: State content correctly Electronic Signature(s) Signed: 07/12/2016 10:11:37 AM By: Catalina Lunger Entered By: Catalina Lunger on 07/11/2016 14:16:53 [name] ([id number]) -------------------------------------------------------------------------------- Wound Assessment Details Patient Name: EKKO, BROOMHEAD R. Date of Service: 07/11/2016 1:30 PM Medical Record Patient Account Number: [id number] Number: Treating RN: Ahmed Prima [birth year] (80 y.o. Other Clinician: Date of Birth/Sex: Female) Treating ROBSON, MICHAEL Primary Care Physician/Extender: Jeryl Columbia Physician: Referring Physician: Abelardo Diesel in Treatment: 3 Wound Status Wound Number: 1 Primary Abscess Etiology: Wound Location: Left Breast Wound Open Wounding Event: Surgical Injury Status: Date Acquired: 05/25/2015 Comorbid Coronary Artery Disease, Hypertension, Weeks Of Treatment: 3 History: Myocardial Infarction, Osteoarthritis, Clustered Wound: No Received Chemotherapy, Received Radiation Photos Photo Uploaded By: Alric Quan on 07/11/2016 16:13:23 Wound Measurements Length: (cm) 0.1 Width: (cm) 0.1 Depth: (cm)  0.1 Area: (cm)  0.008 Volume: (cm) 0.001 % Reduction in Area: 0% % Reduction in Volume: 0% Epithelialization: None Tunneling: No Undermining: No Wound Description Classification: Partial Thickness Wound Margin: Indistinct, nonvisible Exudate Amount: Large Exudate Type: Serous Exudate Color: amber Wound Bed KAYBRIE, OZGA ([id number]) Granulation Amount: Large (67-100%) Exposed Structure Granulation Quality: Pink Fascia Exposed: No Necrotic Amount: None Present (0%) Fat Layer Exposed: No Tendon Exposed: No Muscle Exposed: No Joint Exposed: No Bone Exposed: No Limited to Skin Breakdown Periwound Skin Texture Texture Color No Abnormalities Noted: No No Abnormalities Noted: No Callus: No Atrophie Blanche: No Crepitus: No Cyanosis: No Excoriation: No Ecchymosis: No Fluctuance: No Erythema: No Friable: No Hemosiderin Staining: No Induration: No Mottled: No Localized Edema: No Pallor: No Rash: No Rubor: No Scarring: No Temperature / Pain Moisture Temperature: No Abnormality No Abnormalities Noted: No Dry / Scaly: No Maceration: No Moist: Yes Wound Preparation Ulcer Cleansing: Rinsed/Irrigated with Saline Topical Anesthetic Applied: None Electronic Signature(s) Signed: 07/11/2016 5:02:09 PM By: Alric Quan Signed: 07/12/2016 10:11:37 AM By: Catalina Lunger Entered By: Catalina Lunger on 07/11/2016 13:55:34 [name] ([id number]) -------------------------------------------------------------------------------- Vitals Details Patient Name: [name]. Date of Service: 07/11/2016 1:30 PM Medical Record Patient Account Number: [id number] Number: Treating RN: Ahmed Prima 12-Dec-1935 (80 y.o. Other Clinician: Date of Birth/Sex: Female) Treating ROBSON, MICHAEL Primary Care Physician/Extender: Jeryl Columbia Physician: Referring Physician: Abelardo Diesel in Treatment: 3 Vital Signs Time Taken: 14:55 Temperature  (F): 97.9 Height (in): 65 Pulse (bpm): 72 Weight (lbs): 184 Respiratory Rate (breaths/min): 20 Body Mass Index (BMI): 30.6 Blood Pressure (mmHg): 109/66 Reference Range: 80 - 120 mg / dl Electronic Signature(s) Signed: 07/12/2016 10:11:37 AM By: Catalina Lunger Entered By: Catalina Lunger on 07/11/2016 13:53:12

## 2016-07-12 NOTE — Progress Notes (Signed)
SHAQUIDA, LEMAR ([id number]) Visit Report for 07/11/2016 Chief Complaint Document Details Patient Name: Hailey Ellison, Hailey Ellison. Date of Service: 07/11/2016 1:30 PM Medical Record Patient Account Number: [id number] Number: Treating RN: Ahmed Prima 04-13-1935 (80 y.o. Other Clinician: Date of Birth/Sex: Female) Treating ROBSON, MICHAEL Primary Care Physician/Extender: Jeryl Columbia Physician: Referring Physician: Abelardo Diesel in Treatment: 3 Information Obtained from: Patient Chief Complaint patient is hear for review of a draining sinus in the outer quadrant of her left breast Electronic Signature(s) Signed: 07/11/2016 4:29:49 PM By: Linton Ham MD Entered By: Linton Ham on 07/11/2016 14:20:12 Hailey Ellison ([id number]) -------------------------------------------------------------------------------- HPI Details Patient Name: Hailey Ellison, Hailey Ellison. Date of Service: 07/11/2016 1:30 PM Medical Record Patient Account Number: [id number] Number: Treating RN: Ahmed Prima 08-24-1935 (80 y.o. Other Clinician: Date of Birth/Sex: Female) Treating ROBSON, MICHAEL Primary Care Physician/Extender: Jeryl Columbia Physician: Referring Physician: Abelardo Diesel in Treatment: 3 History of Present Illness HPI Description: 06/19/16; This is a 81 year old woman who is here for review of a draining sinus of the left breast that she states has been present for over a year.The history she gives is that she had a lumpectomy of her left breast by Dr. Bary Castilla in 2010. She was treated with radiation and chemotherapy. She had multiple axillary nodes positive. At some point after this she was felt to have either re-occurence in the same site. She underwent three core biopsies and further surgery the latter of which finally showed her to be free of cancer. For about the last year she has had a small draining sinus in the left upper outer quadrant.  Drains mostly clear fluid, sometimes looking like puss per the patient. She denies pain, fever or other systemic symptoms. the drainage seems to come and go. She has had mammogram and Breast ultrasounds the latter of which suggested fat necrosis. She has apparently been offered further excision of the area by her surgeon however she is uncertain how to proceed. She has been using gauze over the area. She has tried some form of silver based gel and medihoney neither of which had any effect. The patient also has a history of fibrocystic breast disease. She was recently hospitalized for Acute Coronary Syndrome requiring a stent and a culture was done on 6/16 which showed coagulase negative staph and Diptheroids. According to the patient no antibiotics were given In reviewing [place].The patient's area was actually discovered in May 2016. 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 Her last mammogram on 5/12/17showed only benign surgical and radiation changes. Ultrasound of the left breast on 03/27/16 showed a 1.4x1.7 areas of scarring consistent when fat necrosis. There was apparently some area of palpable cancer 2 cm above this however the ultrasound showed only adipose tissue 06/27/16; culture I did last week once again showed coag-negative staph, given this I think it is reasonable to go ahead and treat this although I still have some degree of skepticism that it is actually a true pathogen. Nevertheless this is the second culture that showed coag-negative staph. I gave her a prescription for doxycycline. She tells me that the Iodosorb looked like at one point that it was closing the whole however leg the other topical ointments she is applied the drainage simply found its way to open the orifice once again 07/11/16 the patient has completed her dicloxacillin for  coag-negative staph.  This did not help at all. She still has clear drainage episodically purulent looking/yellow. She does not have any pain. The Iodosorb did not make any difference in the orifice. In fact I think this orifice is actually a draining site and it probably wouldn't be a good idea to totally close this even if this were possible. She is been previously offered a IandD of this area and I've encouraged her to go ahead with this or else unfortunately I think she will need to live with this Hailey Ellison, Hailey Ellison ([id number]) Electronic Signature(s) Signed: 07/11/2016 4:29:49 PM By: Linton Ham MD Entered By: Linton Ham on 07/11/2016 14:21:37 Hailey Ellison, Hailey Ellison ([id number]) -------------------------------------------------------------------------------- Physical Exam Details Patient Name: Hailey Ellison, Hailey R. Date of Service: 07/11/2016 1:30 PM Medical Record Patient Account Number: [id number] Number: Treating RN: Ahmed Prima 15-Mar-1935 (80 y.o. Other Clinician: Date of Birth/Sex: Female) Treating ROBSON, MICHAEL Primary Care Physician/Extender: Jeryl Columbia Physician: Referring Physician: Abelardo Diesel in Treatment: 3 Notes Wound exam; small draining sinus in the left upper quadrant of her breast. There is no surrounding erythema no tenderness no adenopathy. There is still easily expressed clear liquid drainage. I did not see any purulence but I previously seen as well. Electronic Signature(s) Signed: 07/11/2016 4:29:49 PM By: Linton Ham MD Entered By: Linton Ham on 07/11/2016 14:22:19 Hailey Ellison ([id number]) -------------------------------------------------------------------------------- Physician Orders Details Patient Name: Hailey Ellison, Hailey Ellison. Date of Service: 07/11/2016 1:30 PM Medical Record Patient Account Number: [id number] Number: Treating RN: Ahmed Prima June 02, 1935 (80 y.o. Other Clinician: Date of  Birth/Sex: Female) Treating ROBSON, MICHAEL Primary Care Physician/Extender: Jeryl Columbia Physician: Referring Physician: Abelardo Diesel in Treatment: 3 Verbal / Phone Orders: Yes Clinician: Pinkerton, Debi Read Back and Verified: Yes Diagnosis Coding Wound Cleansing Wound #1 Left Breast o Clean wound with Normal Saline. Secondary Dressing Wound #1 Left Breast o Boardered Foam Dressing Discharge From [facility] Services Wound #1 Left Breast o Discharge from [place] - call clinic if any significant changes are seen; follow-up with surgeon per Dr. Janalyn Rouse advice Electronic Signature(s) Signed: 07/11/2016 4:29:49 PM By: Linton Ham MD Signed: 07/11/2016 5:02:09 PM By: Alric Quan Entered By: Alric Quan on 07/11/2016 16:09:29 Hailey Ellison ([id number]) -------------------------------------------------------------------------------- Problem List Details Patient Name: Hailey Ellison, Hailey Ellison. Date of Service: 07/11/2016 1:30 PM Medical Record Patient Account Number: [id number] Number: Treating RN: Ahmed Prima 01-25-1935 (80 y.o. Other Clinician: Date of Birth/Sex: Female) Treating ROBSON, MICHAEL Primary Care Physician/Extender: Jeryl Columbia Physician: Referring Physician: Abelardo Diesel in Treatment: 3 Active Problems ICD-10 Encounter Code Description Active Date Diagnosis S21.002D Unspecified open wound of left breast, subsequent 06/19/2016 Yes encounter N64.1 Fat necrosis of breast 06/19/2016 Yes C50.412 Malignant neoplasm of upper-outer quadrant of left female 06/19/2016 Yes breast Inactive Problems Resolved Problems Electronic Signature(s) Signed: 07/11/2016 4:29:49 PM By: Linton Ham MD Entered By: Linton Ham on 07/11/2016 14:15:04 Hailey Ellison ([id number]) -------------------------------------------------------------------------------- Progress Note Details Patient Name: Hailey Ellison. Date of Service: 07/11/2016 1:30 PM Medical Record Patient Account Number: [id number] Number: Treating RN: Ahmed Prima 1935/06/11 (80 y.o. Other Clinician: Date of Birth/Sex: Female) Treating ROBSON, MICHAEL Primary Care Physician/Extender: Jeryl Columbia Physician: Referring Physician: Abelardo Diesel in Treatment: 3 Subjective Chief Complaint Information obtained from Patient patient is hear for review of a draining sinus in the outer quadrant of her left breast History of Present Illness (HPI) 06/19/16; This is a 81 year old woman who is here for review of a draining  sinus of the left breast that she states has been present for over a year.The history she gives is that she had a lumpectomy of her left breast by Dr. Bary Castilla in 2010. She was treated with radiation and chemotherapy. She had multiple axillary nodes positive. At some point after this she was felt to have either re-occurence in the same site. She underwent three core biopsies and further surgery the latter of which finally showed her to be free of cancer. For about the last year she has had a small draining sinus in the left upper outer quadrant. Drains mostly clear fluid, sometimes looking like puss per the patient. She denies pain, fever or other systemic symptoms. the drainage seems to come and go. She has had mammogram and Breast ultrasounds the latter of which suggested fat necrosis. She has apparently been offered further excision of the area by her surgeon however she is uncertain how to proceed. She has been using gauze over the area. She has tried some form of silver based gel and medihoney neither of which had any effect. The patient also has a history of fibrocystic breast disease. She was recently hospitalized for Acute Coronary Syndrome requiring a stent and a culture was done on 6/16 which showed coagulase negative staph and Diptheroids. According to the patient no antibiotics  were given In reviewing [place].The patient's area was actually discovered in May 2016. 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 Her last mammogram on 5/12/17showed only benign surgical and radiation changes. Ultrasound of the left breast on 03/27/16 showed a 1.4x1.7 areas of scarring consistent when fat necrosis. There was apparently some area of palpable cancer 2 cm above this however the ultrasound showed only adipose tissue 06/27/16; culture I did last week once again showed coag-negative staph, given this I think it is reasonable to go ahead and treat this although I still have some degree of skepticism that it is actually a true pathogen. Nevertheless this is the second culture that showed coag-negative staph. I gave her a prescription for doxycycline. She tells me that the Iodosorb looked like at one point that it was closing the whole however leg the other topical ointments she is applied the drainage simply found its way to open the orifice once again Hailey Ellison, Hailey Ellison. ([id number]) 07/11/16 the patient has completed her dicloxacillin for coag-negative staph. This did not help at all. She still has clear drainage episodically purulent looking/yellow. She does not have any pain. The Iodosorb did not make any difference in the orifice. In fact I think this orifice is actually a draining site and it probably wouldn't be a good idea to totally close this even if this were possible. She is been previously offered a IandD of this area and I've encouraged her to go ahead with this or else unfortunately I think she will need to live with this Objective Constitutional Vitals Time Taken: 2:55 PM, Height: 65 in, Weight: 184 lbs, BMI: 30.6, Temperature: 97.9 F, Pulse: 72 bpm, Respiratory Rate: 20 breaths/min, Blood Pressure: 109/66 mmHg. Integumentary (Hair,  Skin) Wound #1 status is Open. Original cause of wound was Surgical Injury. The wound is located on the Left Breast. The wound measures 0.1cm length x 0.1cm width x 0.1cm depth; 0.008cm^2 area and 0.001cm^3 volume. The wound is limited to skin breakdown. There is no tunneling or undermining noted. There is a large amount of  serous drainage noted. The wound margin is indistinct and nonvisible. There is large (67- 100%) pink granulation within the wound bed. There is no necrotic tissue within the wound bed. The periwound skin appearance exhibited: Moist. The periwound skin appearance did not exhibit: Callus, Crepitus, Excoriation, Fluctuance, Friable, Induration, Localized Edema, Rash, Scarring, Dry/Scaly, Maceration, Atrophie Blanche, Cyanosis, Ecchymosis, Hemosiderin Staining, Mottled, Pallor, Rubor, Erythema. Periwound temperature was noted as No Abnormality. Assessment Active Problems ICD-10 S21.002D - Unspecified open wound of left breast, subsequent encounter N64.1 - Fat necrosis of breast C50.412 - Malignant neoplasm of upper-outer quadrant of left female breast Plan [facility]charge [facility] Services: Hailey Ellison, Hailey Ellison ([id number]) Wound #1 Left Breast: Discharge from [place] - call clinic if any significant changes are seen; follow-up with surgeon per Dr. Janalyn Rouse advice Fat necrosis of the the left breast. Neither the antibiotics nor the iodosorb has many any difference to the patient's symptom complex. I would suppotr and I+D or simply to "llive with it" at the patient's discretion. I don't think we can help her here. Electronic Signature(s) Signed: 07/11/2016 4:29:49 PM By: Linton Ham MD Entered By: Linton Ham on 07/11/2016 14:24:36 Hailey Ellison ([id number]) -------------------------------------------------------------------------------- [place] Details Patient Name: Hailey Ellison, GAIR. Date of Service: 07/11/2016 Medical Record Patient Account Number:  [id number] Number: Treating RN: Ahmed Prima 12/20/1935 (80 y.o. Other Clinician: Date of Birth/Sex: Female) Treating ROBSON, MICHAEL Primary Care Physician/Extender: Jeryl Columbia Physician: Weeks in Treatment: 3 Referring Physician: [name] Diagnosis Coding ICD-10 Codes Code Description S21.002D Unspecified open wound of left breast, subsequent encounter N64.1 Fat necrosis of breast C50.412 Malignant neoplasm of upper-outer quadrant of left female breast Facility Procedures CPT4 Code: [id number] Description: [id number] - WOUND CARE VISIT-LEV 3 EST PT Modifier: Quantity: 1 Physician Procedures CPT4 Code: [id number] Description: [id number] - WC PHYS LEVEL 2 - EST PT ICD-10 Description Diagnosis S21.002D Unspecified open wound of left breast, subseque Modifier: nt encounter Quantity: 1 Electronic Signature(s) Signed: 07/11/2016 4:29:49 PM By: Linton Ham MD Signed: 07/11/2016 5:02:09 PM By: Alric Quan Entered By: Alric Quan on 07/11/2016 16:08:39

## 2016-07-16 ENCOUNTER — Encounter: Payer: Medicare Other | Attending: Cardiovascular Disease

## 2016-07-18 ENCOUNTER — Inpatient Hospital Stay: Admit: 2016-07-18 | Payer: Self-pay | Admitting: Orthopedic Surgery

## 2016-07-18 SURGERY — ARTHROPLASTY, KNEE, TOTAL, USING IMAGELESS COMPUTER-ASSISTED NAVIGATION
Anesthesia: Choice | Laterality: Right

## 2016-07-25 ENCOUNTER — Encounter: Payer: Medicare Other | Attending: Cardiovascular Disease

## 2016-07-25 DIAGNOSIS — S21002D Unspecified open wound of left breast, subsequent encounter: Secondary | ICD-10-CM | POA: Insufficient documentation

## 2016-07-25 DIAGNOSIS — N189 Chronic kidney disease, unspecified: Secondary | ICD-10-CM | POA: Insufficient documentation

## 2016-07-25 DIAGNOSIS — I2510 Atherosclerotic heart disease of native coronary artery without angina pectoris: Secondary | ICD-10-CM | POA: Insufficient documentation

## 2016-07-25 DIAGNOSIS — Z8673 Personal history of transient ischemic attack (TIA), and cerebral infarction without residual deficits: Secondary | ICD-10-CM | POA: Insufficient documentation

## 2016-07-25 DIAGNOSIS — Z955 Presence of coronary angioplasty implant and graft: Secondary | ICD-10-CM | POA: Insufficient documentation

## 2016-07-25 DIAGNOSIS — Z9861 Coronary angioplasty status: Secondary | ICD-10-CM | POA: Insufficient documentation

## 2016-07-25 DIAGNOSIS — I252 Old myocardial infarction: Secondary | ICD-10-CM | POA: Insufficient documentation

## 2016-07-25 DIAGNOSIS — X58XXXD Exposure to other specified factors, subsequent encounter: Secondary | ICD-10-CM | POA: Insufficient documentation

## 2016-07-25 DIAGNOSIS — C50412 Malignant neoplasm of upper-outer quadrant of left female breast: Secondary | ICD-10-CM | POA: Insufficient documentation

## 2016-07-25 DIAGNOSIS — N641 Fat necrosis of breast: Secondary | ICD-10-CM | POA: Insufficient documentation

## 2016-07-25 DIAGNOSIS — I129 Hypertensive chronic kidney disease with stage 1 through stage 4 chronic kidney disease, or unspecified chronic kidney disease: Secondary | ICD-10-CM | POA: Insufficient documentation

## 2016-07-25 DIAGNOSIS — M1990 Unspecified osteoarthritis, unspecified site: Secondary | ICD-10-CM | POA: Insufficient documentation

## 2016-07-30 ENCOUNTER — Telehealth: Payer: Self-pay | Admitting: *Deleted

## 2016-07-30 NOTE — Telephone Encounter (Signed)
Called to check on status to return to program.

## 2016-08-03 ENCOUNTER — Telehealth: Payer: Self-pay | Admitting: *Deleted

## 2016-08-03 DIAGNOSIS — I213 ST elevation (STEMI) myocardial infarction of unspecified site: Secondary | ICD-10-CM

## 2016-08-03 DIAGNOSIS — Z955 Presence of coronary angioplasty implant and graft: Secondary | ICD-10-CM

## 2016-08-03 NOTE — Telephone Encounter (Signed)
Called to check on status to return to program. Returning [place] call to [place]. LMOM

## 2016-08-08 ENCOUNTER — Encounter: Payer: Self-pay | Admitting: *Deleted

## 2016-08-08 DIAGNOSIS — I213 ST elevation (STEMI) myocardial infarction of unspecified site: Secondary | ICD-10-CM

## 2016-08-08 DIAGNOSIS — Z955 Presence of coronary angioplasty implant and graft: Secondary | ICD-10-CM

## 2016-08-08 NOTE — Progress Notes (Signed)
Cardiac Individual Treatment Plan  Patient Details  Name: Hailey Ellison MRN: [id number] Date of Birth: 1935/01/07 Referring Provider:   Flowsheet Row Cardiac Rehab from 06/25/2016 in [facility] Cardiac and Pulmonary Rehab  Referring Provider  Arida      Initial Encounter Date:  Flowsheet Row Cardiac Rehab from 06/25/2016 in [facility] Cardiac and Pulmonary Rehab  Date  06/25/16  Referring Provider  Fletcher Anon      Visit Diagnosis: ST elevation myocardial infarction (STEMI), unspecified artery [facility])  Status post coronary artery stent placement  Patient's Home Medications on Admission:  Current Outpatient Prescriptions:  .  acetaminophen (TYLENOL) 650 MG CR tablet, Take by mouth., Disp: , Rfl:  .  anastrozole (ARIMIDEX) 1 MG tablet, Take 1 mg by mouth daily., Disp: , Rfl:  .  aspirin 81 MG chewable tablet, Chew 1 tablet (81 mg total) by mouth daily., Disp: 30 tablet, Rfl: 2 .  atorvastatin (LIPITOR) 40 MG tablet, Take 1 tablet (40 mg total) by mouth at bedtime., Disp: 30 tablet, Rfl: 2 .  calcium citrate-vitamin D (CITRACAL+D) 315-200 MG-UNIT per tablet, Take 1 tablet by mouth 2 (two) times daily., Disp: , Rfl:  .  dipyridamole-aspirin (AGGRENOX) 200-25 MG per 12 hr capsule, Take 1 capsule by mouth 2 (two) times daily. Reported on 06/25/2016, Disp: , Rfl:  .  furosemide (LASIX) 20 MG tablet, Take 20 mg by mouth daily as needed for fluid. , Disp: , Rfl:  .  glucosamine-chondroitin 500-400 MG tablet, Take 2 tablets by mouth daily., Disp: , Rfl:  .  hydrochlorothiazide (HYDRODIURIL) 25 MG tablet, Take 0.5 tablets (12.5 mg total) by mouth daily. (Patient not taking: Reported on 06/25/2016), Disp: , Rfl:  .  lansoprazole (PREVACID) 30 MG capsule, Take 30 mg by mouth daily., Disp: , Rfl:  .  Multiple Vitamin (MULTIVITAMIN WITH MINERALS) TABS, Take 1 tablet by mouth daily., Disp: , Rfl:  .  PARoxetine (PAXIL) 40 MG tablet, Take 40 mg by mouth daily., Disp: , Rfl:  .  ticagrelor (BRILINTA) 90 MG TABS tablet, Take  1 tablet (90 mg total) by mouth 2 (two) times daily., Disp: 60 tablet, Rfl: 0 .  valsartan (DIOVAN) 320 MG tablet, Take 320 mg by mouth daily., Disp: , Rfl:  .  verapamil (CALAN) 120 MG tablet, Take 60 mg by mouth daily., Disp: , Rfl:   Past Medical History: Past Medical History:  Diagnosis Date  . Arthritis   . Breast cancer ([place])    Adjuvant chemotherapy with Taxotere/Carboplatin/Herceptin. Arimidex initiated November 2011  . Cancer ([place]) 2010   T1c,N2a, M0; ER 90%, PR 60%,  HER-2/neu 2+ IHC, equivocal on fish. 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  Margins clear.  . Chronic kidney disease 2014  . Hypertension   . Lymphedema   . Murmur   . Radiation 2010   with chemo  . Squamous cell carcinoma [facility]) June 2016   Left upper inner chest wall, area of previous radiation.    Tobacco Use: History  Smoking Status  . Former Smoker  Smokeless Tobacco  . Never Used    Labs: Recent [employer] for ITP Cardiac and Pulmonary Rehab Latest Ref Rng & Units 06/08/2016   Cholestrol 0 - 200 mg/dL 135   LDLCALC 0 - 99 mg/dL 81   HDL >40 mg/dL 47   Trlycerides <150 mg/dL 37  Exercise Target Goals:    Exercise Program Goal: Individual exercise prescription set with THRR, safety & activity barriers. Participant demonstrates ability to understand and report RPE using BORG scale, to self-measure pulse accurately, and to acknowledge the importance of the exercise prescription.  Exercise Prescription Goal: Starting with aerobic activity 30 plus minutes a day, 3 days per week for initial exercise prescription. Provide home exercise prescription and guidelines that participant acknowledges understanding prior to discharge.  Activity Barriers & Risk Stratification:     Activity Barriers & Cardiac Risk Stratification - 06/25/16 1458       Activity Barriers & Cardiac Risk Stratification   Activity Barriers Joint Problems;Back Problems;Shortness of Breath;Deconditioning  Was to have left knee replacement and had heart attack week before scheduled surgery.  BAck problems are old and no concerns at present. Will let staff know if exercise flares back . SOB with exertion. Questions if may be deconditioning. Has osteoporosis   Cardiac Risk Stratification High      6 Minute Walk:     6 Minute Walk    Row Name 06/25/16 1424         6 Minute Walk   Phase Initial     Distance 900 feet     Walk Time 6 minutes     MPH 1.7     METS 1.5     RPE 13     VO2 Peak 5.3     Symptoms No     Resting HR 81 bpm     Resting BP 110/60     Max Ex. HR 110 bpm     Max Ex. BP 118/56        Initial Exercise Prescription:     Initial Exercise Prescription - 06/25/16 1400      Date of Initial Exercise RX and Referring Provider   Date 06/25/16   Referring Provider Arida     Recumbant Bike   Level 1   RPM 50   Watts 20   Minutes 15     NuStep   Level 1   Watts 25   Minutes 15     T5 Nustep   Level 1   Watts 25   Minutes 15     Biostep-RELP   Level 1   Watts 25   Minutes 15     Prescription Details   Frequency (times per week) 3   Duration Progress to 30 minutes of continuous aerobic without signs/symptoms of physical distress     Intensity   THRR 40-80% of Max Heartrate 105-128   Ratings of Perceived Exertion 11-13   Perceived Dyspnea 0-4     Progression   Progression Continue to progress workloads to maintain intensity without signs/symptoms of physical distress.     Resistance Training   Training Prescription Yes   Weight 1   Reps 10-15      Perform Capillary Blood Glucose checks as needed.  Exercise Prescription Changes:     Exercise Prescription Changes    Row Name 06/25/16 1400             Response to Exercise   Blood Pressure (Admit) 110/60       Blood Pressure (Exercise) 118/56        Blood Pressure (Exit) 94/58       Heart Rate (Admit) 81 bpm       Heart Rate (Exercise) 110 bpm       Heart Rate (Exit) 73 bpm  Rating of Perceived Exertion (Exercise) 13          Exercise Comments:     Exercise Comments    Row Name 08/03/16 1030           Exercise Comments Ms Deisher has not attended the program since her medical review 06/25/16.          Discharge Exercise Prescription (Final Exercise Prescription Changes):     Exercise Prescription Changes - 06/25/16 1400      Response to Exercise   Blood Pressure (Admit) 110/60   Blood Pressure (Exercise) 118/56   Blood Pressure (Exit) 94/58   Heart Rate (Admit) 81 bpm   Heart Rate (Exercise) 110 bpm   Heart Rate (Exit) 73 bpm   Rating of Perceived Exertion (Exercise) 13      Nutrition:  Target Goals: Understanding of nutrition guidelines, daily intake of sodium <1548m, cholesterol <2060m calories 30% from fat and 7% or less from saturated fats, daily to have 5 or more servings of fruits and vegetables.  Biometrics:     Pre Biometrics - 06/25/16 1423      Pre Biometrics   Height 5' 4.5" (1.638 m)   Weight 184 lb 6.4 oz (83.6 kg)   Waist Circumference 42.5 inches   Hip Circumference 47.25 inches   Waist to Hip Ratio 0.9 %   BMI (Calculated) 31.2   Single Leg Stand 1.32 seconds       Nutrition Therapy Plan and Nutrition Goals:     Nutrition Therapy & Goals - 06/25/16 1451      Intervention Plan   Intervention Prescribe, educate and counsel regarding individualized specific dietary modifications aiming towards targeted core components such as weight, hypertension, lipid management, diabetes, heart failure and other comorbidities.   Expected Outcomes Short Term Goal: Understand basic principles of dietary content, such as calories, fat, sodium, cholesterol and nutrients.;Short Term Goal: A plan has been developed with personal nutrition goals set during dietitian appointment.;Long Term Goal: Adherence  to prescribed nutrition plan.      Nutrition Discharge: Rate Your Plate Scores:   Nutrition Goals Re-Evaluation:   Psychosocial: Target Goals: Acknowledge presence or absence of depression, maximize coping skills, provide positive support system. Participant is able to verbalize types and ability to use techniques and skills needed for reducing stress and depression.  Initial Review & Psychosocial Screening:     Initial Psych Review & Screening - 06/25/16 1452      Initial Review   Current issues with Current Depression;Current Psychotropic Meds     Family Dynamics   Good Support System? Yes  2 daughters.  Husband died in [place]07-Nov-2014   Barriers   Psychosocial barriers to participate in program There are no identifiable barriers or psychosocial needs.;The patient should benefit from training in stress management and relaxation.     Screening Interventions   Interventions Encouraged to exercise      Quality of Life Scores:     Quality of Life - 06/25/16 1457      Quality of Life Scores   Health/Function Pre 24.32 %   Socioeconomic Pre 29.58 %   Psych/Spiritual Pre 30 %   Family Pre 28.5 %   GLOBAL Pre 27.16 %      PHQ-9: Recent Review Flowsheet Data    [facility]   Decreased Interest 1   Down, Depressed, Hopeless 0   PHQ - 2 Score 1   Altered sleeping 1   Tired, decreased  energy 2   Change in appetite 3   Feeling bad or failure about yourself  0   Trouble concentrating 0   Moving slowly or fidgety/restless 0   PHQ-9 Score 7   Difficult doing work/chores Somewhat difficult      Psychosocial Evaluation and Intervention:   Psychosocial Re-Evaluation:   Vocational Rehabilitation: Provide vocational rehab assistance to qualifying candidates.   Vocational Rehab Evaluation & Intervention:     Vocational Rehab - 06/25/16 1502      Initial Vocational Rehab Evaluation & Intervention   Assessment shows need for Vocational  Rehabilitation No      Education: Education Goals: Education classes will be provided on a weekly basis, covering required topics. Participant will state understanding/return demonstration of topics presented.  Learning Barriers/Preferences:     Learning Barriers/Preferences - 06/25/16 1501      Learning Barriers/Preferences   Learning Barriers None   Learning Preferences Verbal Instruction;Written Material      Education Topics: General Nutrition Guidelines/Fats and Fiber: -Group instruction provided by verbal, written material, models and posters to present the general guidelines for heart healthy nutrition. Gives an explanation and review of dietary fats and fiber.   Controlling Sodium/Reading Food Labels: -Group verbal and written material supporting the discussion of sodium use in heart healthy nutrition. Review and explanation with models, verbal and written materials for utilization of the food label.   Exercise Physiology & Risk Factors: - Group verbal and written instruction with models to review the exercise physiology of the cardiovascular system and associated critical values. Details cardiovascular disease risk factors and the goals associated with each risk factor.   Aerobic Exercise & Resistance Training: - Gives group verbal and written discussion on the health impact of inactivity. On the components of aerobic and resistive training programs and the benefits of this training and how to safely progress through these programs.   Flexibility, Balance, General Exercise Guidelines: - Provides group verbal and written instruction on the benefits of flexibility and balance training programs. Provides general exercise guidelines with specific guidelines to those with heart or lung disease. Demonstration and skill practice provided.   Stress Management: - Provides group verbal and written instruction about the health risks of elevated stress, cause of high stress, and  healthy ways to reduce stress.   Depression: - Provides group verbal and written instruction on the correlation between heart/lung disease and depressed mood, treatment options, and the stigmas associated with seeking treatment.   Anatomy & Physiology of the Heart: - Group verbal and written instruction and models provide basic cardiac anatomy and physiology, with the coronary electrical and arterial systems. Review of: AMI, Angina, Valve disease, Heart Failure, Cardiac Arrhythmia, Pacemakers, and the ICD.   Cardiac Procedures: - Group verbal and written instruction and models to describe the testing methods done to diagnose heart disease. Reviews the outcomes of the test results. Describes the treatment choices: Medical Management, Angioplasty, or Coronary Bypass Surgery.   Cardiac Medications: - Group verbal and written instruction to review commonly prescribed medications for heart disease. Reviews the medication, class of the drug, and side effects. Includes the steps to properly store meds and maintain the prescription regimen.   Go Sex-Intimacy & Heart Disease, Get SMART - Goal Setting: - Group verbal and written instruction through game format to discuss heart disease and the return to sexual intimacy. Provides group verbal and written material to discuss and apply goal setting through the application of the S.M.A.R.T. Method.   Other Matters of  the Heart: - Provides group verbal, written materials and models to describe Heart Failure, Angina, Valve Disease, and Diabetes in the realm of heart disease. Includes description of the disease process and treatment options available to the cardiac patient.   Exercise & Equipment Safety: - Individual verbal instruction and demonstration of equipment use and safety with use of the equipment. Flowsheet Row Cardiac Rehab from 06/25/2016 in [facility] Cardiac and Pulmonary Rehab  Date  06/25/16  Educator  SB  Instruction Review Code  2- meets  goals/outcomes      Infection Prevention: - Provides verbal and written material to individual with discussion of infection control including proper hand washing and proper equipment cleaning during exercise session. Flowsheet Row Cardiac Rehab from 06/25/2016 in [facility] Cardiac and Pulmonary Rehab  Date  06/25/16  Educator  SB  Instruction Review Code  2- meets goals/outcomes      Falls Prevention: - Provides verbal and written material to individual with discussion of falls prevention and safety.   Diabetes: - Individual verbal and written instruction to review signs/symptoms of diabetes, desired ranges of glucose level fasting, after meals and with exercise. Advice that pre and post exercise glucose checks will be done for 3 sessions at entry of program.    Knowledge Questionnaire Score:     Knowledge Questionnaire Score - 06/25/16 1501      Knowledge Questionnaire Score   Pre Score 21/27      Core Components/Risk Factors/Patient Goals at Admission:     Personal Goals and Risk Factors at Admission - 06/25/16 1451      Core Components/Risk Factors/Patient Goals on Admission    Weight Management Obesity;Yes   Intervention Weight Management: Develop a combined nutrition and exercise program designed to reach desired caloric intake, while maintaining appropriate intake of nutrient and fiber, sodium and fats, and appropriate energy expenditure required for the weight goal.;Weight Management: Provide education and appropriate resources to help participant work on and attain dietary goals.;Weight Management/Obesity: Establish reasonable short term and long term weight goals.   Expected Outcomes Short Term: Continue to assess and modify interventions until short term weight is achieved;Long Term: Adherence to nutrition and physical activity/exercise program aimed toward attainment of established weight goal;Weight Loss: Understanding of general recommendations for a balanced deficit  meal plan, which promotes 1-2 lb weight loss per week and includes a negative energy balance of [phone] kcal/d   Sedentary Yes   Intervention Provide advice, education, support and counseling about physical activity/exercise needs.;Develop an individualized exercise prescription for aerobic and resistive training based on initial evaluation findings, risk stratification, comorbidities and participant's personal goals.   Expected Outcomes Achievement of increased cardiorespiratory fitness and enhanced flexibility, muscular endurance and strength shown through measurements of functional capacity and personal statement of participant.   Increase Strength and Stamina Yes   Intervention Provide advice, education, support and counseling about physical activity/exercise needs.;Develop an individualized exercise prescription for aerobic and resistive training based on initial evaluation findings, risk stratification, comorbidities and participant's personal goals.   Expected Outcomes Achievement of increased cardiorespiratory fitness and enhanced flexibility, muscular endurance and strength shown through measurements of functional capacity and personal statement of participant.   Hypertension Yes   Intervention Provide education on lifestyle modifcations including regular physical activity/exercise, weight management, moderate sodium restriction and increased consumption of fresh fruit, vegetables, and low fat dairy, alcohol moderation, and smoking cessation.;Monitor prescription use compliance.   Expected Outcomes Short Term: Continued assessment and intervention until BP is < 140/2m HG in  hypertensive participants. < 130/44m HG in hypertensive participants with diabetes, heart failure or chronic kidney disease.;Long Term: Maintenance of blood pressure at goal levels.   Lipids Yes   Intervention Provide education and support for participant on nutrition & aerobic/resistive exercise along with prescribed  medications to achieve LDL <778m HDL >4055m  Expected Outcomes Short Term: Participant states understanding of desired cholesterol values and is compliant with medications prescribed. Participant is following exercise prescription and nutrition guidelines.;Long Term: Cholesterol controlled with medications as prescribed, with individualized exercise RX and with personalized nutrition plan. Value goals: LDL < 68m27mDL > 40 mg.      Core Components/Risk Factors/Patient Goals Review:    Core Components/Risk Factors/Patient Goals at Discharge (Final Review):    ITP Comments:     ITP Comments    Row Name 06/25/16 1443 07/11/16 0818 08/08/16 [id number]   ITP Comments Medical review completed today with ITP created.  Continue with ITP. [facility]  [facility]. Continue with ITP 30 day review. Continue with ITP unless changes noted by Medical Director at signature of review. Has attended medical review 7/3, no visits since.         Comments:

## 2016-08-09 DIAGNOSIS — I213 ST elevation (STEMI) myocardial infarction of unspecified site: Secondary | ICD-10-CM

## 2016-08-09 DIAGNOSIS — I129 Hypertensive chronic kidney disease with stage 1 through stage 4 chronic kidney disease, or unspecified chronic kidney disease: Secondary | ICD-10-CM | POA: Diagnosis not present

## 2016-08-09 DIAGNOSIS — X58XXXD Exposure to other specified factors, subsequent encounter: Secondary | ICD-10-CM | POA: Diagnosis not present

## 2016-08-09 DIAGNOSIS — Z9861 Coronary angioplasty status: Secondary | ICD-10-CM | POA: Diagnosis not present

## 2016-08-09 DIAGNOSIS — Z955 Presence of coronary angioplasty implant and graft: Secondary | ICD-10-CM | POA: Diagnosis not present

## 2016-08-09 DIAGNOSIS — N189 Chronic kidney disease, unspecified: Secondary | ICD-10-CM | POA: Diagnosis not present

## 2016-08-09 DIAGNOSIS — M1990 Unspecified osteoarthritis, unspecified site: Secondary | ICD-10-CM | POA: Diagnosis not present

## 2016-08-09 DIAGNOSIS — S21002D Unspecified open wound of left breast, subsequent encounter: Secondary | ICD-10-CM | POA: Diagnosis not present

## 2016-08-09 DIAGNOSIS — I2510 Atherosclerotic heart disease of native coronary artery without angina pectoris: Secondary | ICD-10-CM | POA: Diagnosis not present

## 2016-08-09 DIAGNOSIS — Z8673 Personal history of transient ischemic attack (TIA), and cerebral infarction without residual deficits: Secondary | ICD-10-CM | POA: Diagnosis not present

## 2016-08-09 DIAGNOSIS — N641 Fat necrosis of breast: Secondary | ICD-10-CM | POA: Diagnosis not present

## 2016-08-09 DIAGNOSIS — C50412 Malignant neoplasm of upper-outer quadrant of left female breast: Secondary | ICD-10-CM | POA: Diagnosis present

## 2016-08-09 DIAGNOSIS — I252 Old myocardial infarction: Secondary | ICD-10-CM | POA: Diagnosis not present

## 2016-08-09 NOTE — Progress Notes (Signed)
Daily Session Note  Patient Details  Name: Hailey Ellison MRN: [id number] Date of Birth: 1935/06/06 Referring Provider:   Flowsheet Row Cardiac Rehab from 06/25/2016 in [facility] Cardiac and Pulmonary Rehab  Referring Provider  Arida      Encounter Date: 08/09/2016  Check In:     Session Check In - 08/09/16 1728      Check-In   Location [facility]-Cardiac & Pulmonary Rehab   Staff Present Jeanell Sparrow, DPT, Burlene Arnt, BA, ACSM CEP, Exercise Physiologist;Diane Joya Gaskins, RN, BSN   Supervising physician immediately available to respond to emergencies See telemetry face sheet for immediately available ER MD   Medication changes reported     No   Fall or balance concerns reported    No   Warm-up and Cool-down Performed on first and last piece of equipment   Resistance Training Performed Yes   VAD Patient? No     Pain Assessment   Currently in Pain? No/denies   Multiple Pain Sites No         Goals Met:  No report of cardiac concerns or symptoms Strength training completed today  Goals Unmet:  Not Applicable  Comments: .First day of exercise! Patient was oriented to the gym and the equipment functions and settings. Procedures and policies of the gym were outlined and explained. The patient's individual exercise prescription and treatment plan were reviewed with them. All starting workloads were established based on the results of the functional testing  done at the initial intake visit. The plan for exercise progression was also introduced and progression will be customized based on the patient's performance and goals.    Dr. Emily Filbert is Medical Director for [place] and [facility].

## 2016-08-13 DIAGNOSIS — C50412 Malignant neoplasm of upper-outer quadrant of left female breast: Secondary | ICD-10-CM | POA: Diagnosis not present

## 2016-08-13 DIAGNOSIS — Z955 Presence of coronary angioplasty implant and graft: Secondary | ICD-10-CM

## 2016-08-13 DIAGNOSIS — I213 ST elevation (STEMI) myocardial infarction of unspecified site: Secondary | ICD-10-CM

## 2016-08-13 NOTE — Progress Notes (Signed)
Daily Session Note  Patient Details  Name: Hailey Ellison MRN: [id number] Date of Birth: Aug 11, 1935 Referring Provider:   Flowsheet Row Cardiac Rehab from 06/25/2016 in [facility] Cardiac and Pulmonary Rehab  Referring Provider  Arida      Encounter Date: 08/13/2016  Check In:     Session Check In - 08/13/16 1624      Check-In   Location [facility]-Cardiac & Pulmonary Rehab   Staff Present Earlean Shawl, BS, ACSM CEP, Exercise Physiologist;Parrish Daddario Oletta Darter, BA, ACSM CEP, Exercise Physiologist;Carroll Enterkin, RN, BSN   Supervising physician immediately available to respond to emergencies See telemetry face sheet for immediately available ER MD   Medication changes reported     No   Fall or balance concerns reported    No   Warm-up and Cool-down Performed on first and last piece of equipment   Resistance Training Performed Yes   VAD Patient? No     Pain Assessment   Currently in Pain? No/denies   Multiple Pain Sites No         Goals Met:  Proper associated with RPD/PD & O2 Sat [place] with exercise equipment No report of cardiac concerns or symptoms Strength training completed today  Goals Unmet:  Not Applicable  Comments: Pt able to follow exercise prescription today without complaint.  Will continue to monitor for progression.    Dr. Emily Filbert is Medical Director for [place] and [facility].

## 2016-08-18 ENCOUNTER — Emergency Department
Admission: EM | Admit: 2016-08-18 | Discharge: 2016-08-18 | Disposition: A | Discharge status: Home / Self Care | Payer: Medicare Other | Attending: Emergency Medicine | Admitting: Emergency Medicine

## 2016-08-18 ENCOUNTER — Encounter: Payer: Self-pay | Admitting: Emergency Medicine

## 2016-08-18 ENCOUNTER — Emergency Department: Payer: Medicare Other

## 2016-08-18 DIAGNOSIS — M79672 Pain in left foot: Secondary | ICD-10-CM | POA: Diagnosis present

## 2016-08-18 DIAGNOSIS — Z87891 Personal history of nicotine dependence: Secondary | ICD-10-CM | POA: Insufficient documentation

## 2016-08-18 DIAGNOSIS — Z85828 Personal history of other malignant neoplasm of skin: Secondary | ICD-10-CM | POA: Diagnosis not present

## 2016-08-18 DIAGNOSIS — Z7982 Long term (current) use of aspirin: Secondary | ICD-10-CM | POA: Insufficient documentation

## 2016-08-18 DIAGNOSIS — I2510 Atherosclerotic heart disease of native coronary artery without angina pectoris: Secondary | ICD-10-CM | POA: Diagnosis not present

## 2016-08-18 DIAGNOSIS — I252 Old myocardial infarction: Secondary | ICD-10-CM | POA: Diagnosis not present

## 2016-08-18 DIAGNOSIS — Z791 Long term (current) use of non-steroidal anti-inflammatories (NSAID): Secondary | ICD-10-CM | POA: Insufficient documentation

## 2016-08-18 DIAGNOSIS — L03116 Cellulitis of left lower limb: Secondary | ICD-10-CM

## 2016-08-18 DIAGNOSIS — Z79899 Other long term (current) drug therapy: Secondary | ICD-10-CM | POA: Diagnosis not present

## 2016-08-18 DIAGNOSIS — Z853 Personal history of malignant neoplasm of breast: Secondary | ICD-10-CM | POA: Diagnosis not present

## 2016-08-18 DIAGNOSIS — I129 Hypertensive chronic kidney disease with stage 1 through stage 4 chronic kidney disease, or unspecified chronic kidney disease: Secondary | ICD-10-CM | POA: Insufficient documentation

## 2016-08-18 DIAGNOSIS — N189 Chronic kidney disease, unspecified: Secondary | ICD-10-CM | POA: Insufficient documentation

## 2016-08-18 HISTORY — DX: Acute myocardial infarction, unspecified: I21.9

## 2016-08-18 HISTORY — DX: Age-related osteoporosis without current pathological fracture: M81.0

## 2016-08-18 LAB — BASIC METABOLIC PANEL
ANION GAP: 8 (ref 5–15)
BUN: 30 mg/dL — ABNORMAL HIGH (ref 6–20)
CALCIUM: 9.8 mg/dL (ref 8.9–10.3)
CO2: 25 mmol/L (ref 22–32)
CREATININE: 1.73 mg/dL — AB (ref 0.44–1.00)
Chloride: 106 mmol/L (ref 101–111)
GFR calc non Af Amer: 27 mL/min — ABNORMAL LOW (ref >60)
GFR, EST AFRICAN AMERICAN: 31 mL/min — AB (ref >60)
Glucose, Bld: 103 mg/dL — ABNORMAL HIGH (ref 65–99)
Potassium: 4.3 mmol/L (ref 3.5–5.1)
SODIUM: 139 mmol/L (ref 135–145)

## 2016-08-18 LAB — CBC
HCT: 36.9 % (ref 35.0–47.0)
Hemoglobin: 13.2 g/dL (ref 12.0–16.0)
MCH: 34.6 pg — ABNORMAL HIGH (ref 26.0–34.0)
MCHC: 35.7 g/dL (ref 32.0–36.0)
MCV: 97.1 fL (ref 80.0–100.0)
PLATELETS: 153 10*3/uL (ref 150–440)
RBC: 3.8 MIL/uL (ref 3.80–5.20)
RDW: 12.6 % (ref 11.5–14.5)
WBC: 8 10*3/uL (ref 3.6–11.0)

## 2016-08-18 MED ORDER — CEPHALEXIN 500 MG PO CAPS: 500.0000 mg | ORAL_CAPSULE | Freq: Four times a day (QID) | ORAL | 0 refills | Status: AC

## 2016-08-18 MED ORDER — CEFAZOLIN IN D5W 1 GM/50ML IV SOLN
1.0000 g | Freq: Once | INTRAVENOUS | Status: AC
  Administered 2016-08-18: 1 g via INTRAVENOUS
  Filled 2016-08-18: qty 50

## 2016-08-18 NOTE — ED Notes (Signed)
Triage completed by Leonides Schanz, RN.

## 2016-08-18 NOTE — ED Notes (Signed)
Redness on foot outlined by this RN. Patient instructed to keep the area outlined and to return if she develops a fever or if the redness spreads.

## 2016-08-18 NOTE — ED Notes (Signed)
Patient transported to US 

## 2016-08-18 NOTE — ED Provider Notes (Signed)
[facility] Emergency Department Provider Note  ____________________________________________  Time seen: Approximately 3:34 PM  I have reviewed the triage vital signs and the nursing notes.   HISTORY  Chief Complaint Foot Pain   HPI Hailey Ellison is a 81 y.o. female a history of arthritis, breast cancer now on remission, CKD, hypertension, hyperlipidemia, CAD s/p STEMI in 05/2016 who presents for evaluation of left foot pain, swelling and redness. She reports that about a week ago she noticed a lump in the medial aspect of her left foot. She reports discomfort every time she bears weight. Today she noticed that the foot was getting progressively more swollen and noticed some redness over it. She went to urgent care and was sent here for further evaluation. Patient reports mild pain in her foot that is worse when she bears weight, located mostly on the ankle, now radiating to the dorsum of the foot. She denies personal or family history of blood clots, trauma to her leg, fever. She does endorse some nausea that started today. No vomiting. No chills.  Past Medical History:  Diagnosis Date  . Arthritis   . Breast cancer ([place])    Adjuvant chemotherapy with Taxotere/Carboplatin/Herceptin. Arimidex initiated November 2011  . Cancer ([place]) 2010   T1c,N2a, M0; ER 90%, PR 60%,  HER-2/neu 2+ IHC, equivocal on fish. 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  Margins clear.  . Chronic kidney disease 2014  . Heart attack ([place]) 06/09/2016  . Hypertension   . Lymphedema   . Murmur   . Osteoporosis   . Radiation 2010   with chemo  . Squamous cell carcinoma [facility]) June 2016   Left upper inner chest wall, area of previous radiation.    Patient Active Problem List   Diagnosis Date Noted  . ST elevation myocardial infarction (STEMI) of inferior wall  ([place]) 06/08/2016  . ST elevation myocardial infarction involving right coronary artery ([place])   . Breast mass, left 03/27/2016  . Fat necrosis (segmental) of breast 07/26/2015  . Squamous cell carcinoma in situ 05/25/2015  . Skin nodule 05/14/2015  . Lymphedema 08/05/2014  . Lump or mass in breast 07/08/2014  . Personal history of malignant neoplasm of breast 05/25/2014  . Breast cancer, stage 2 ([place]) 04/08/2013    Past Surgical History:  Procedure Laterality Date  . BREAST BIOPSY Left 2010  . BREAST BIOPSY Right   . BREAST CYST EXCISION    . BREAST LUMPECTOMY Left 2010  . CARDIAC CATHETERIZATION N/A 06/08/2016   Procedure: Left Heart Cath and Coronary Angiography;  Surgeon: Wellington Hampshire, MD;  Location: [place] [facility];  Service: Cardiovascular;  Laterality: N/A;  . CARDIAC CATHETERIZATION N/A 06/08/2016   Procedure: Coronary Stent Intervention;  Surgeon: Wellington Hampshire, MD;  Location: [place] [facility];  Service: Cardiovascular;  Laterality: N/A;  . CARPAL TUNNEL RELEASE Bilateral   . CHOLECYSTECTOMY    . JOINT REPLACEMENT Left 2008  . PARTIAL HYSTERECTOMY  1980's  . PORT-A-CATH REMOVAL      Prior to Admission medications   Medication Sig Start Date End Date Taking? Authorizing Provider  acetaminophen (TYLENOL) 650 MG CR tablet Take by mouth.    Historical Provider, MD  anastrozole (ARIMIDEX) 1 MG tablet Take 1 mg by mouth daily.    Historical Provider, MD  aspirin 81 MG chewable tablet Chew 1 tablet (81 mg  total) by mouth daily. 06/10/16   Demetrios Loll, MD  atorvastatin (LIPITOR) 40 MG tablet Take 1 tablet (40 mg total) by mouth at bedtime. 06/10/16   Demetrios Loll, MD  calcium citrate-vitamin D (CITRACAL+D) 315-200 MG-UNIT per tablet Take 1 tablet by mouth 2 (two) times daily.    Historical Provider, MD  cephALEXin (KEFLEX) 500 MG capsule Take 1 capsule (500 mg total) by mouth 4 (four) times daily. 08/18/16 08/25/16  Rudene Re, MD  dipyridamole-aspirin (AGGRENOX) 200-25  MG per 12 hr capsule Take 1 capsule by mouth 2 (two) times daily. Reported on 06/25/2016    Historical Provider, MD  furosemide (LASIX) 20 MG tablet Take 20 mg by mouth daily as needed for fluid.     Historical Provider, MD  glucosamine-chondroitin 500-400 MG tablet Take 2 tablets by mouth daily.    Historical Provider, MD  hydrochlorothiazide (HYDRODIURIL) 25 MG tablet Take 0.5 tablets (12.5 mg total) by mouth daily. Patient not taking: Reported on 06/25/2016 06/10/16   Demetrios Loll, MD  lansoprazole (PREVACID) 30 MG capsule Take 30 mg by mouth daily.    Historical Provider, MD  Multiple Vitamin (MULTIVITAMIN WITH MINERALS) TABS Take 1 tablet by mouth daily.    Historical Provider, MD  PARoxetine (PAXIL) 40 MG tablet Take 40 mg by mouth daily.    Historical Provider, MD  ticagrelor (BRILINTA) 90 MG TABS tablet Take 1 tablet (90 mg total) by mouth 2 (two) times daily. 06/10/16   Demetrios Loll, MD  valsartan (DIOVAN) 320 MG tablet Take 320 mg by mouth daily.    Historical Provider, MD  verapamil (CALAN) 120 MG tablet Take 60 mg by mouth daily.    Historical Provider, MD    Allergies Benadryl [diphenhydramine hcl]; Tape; Tegaderm ag mesh [silver]; and Neosporin [neomycin-bacitracin zn-polymyx]  Family History  Problem Relation Age of Onset  . Cancer Sister     breast  . Cancer Sister     kidney    Social History Social History  Substance Use Topics  . Smoking status: Former Research scientist (life sciences)  . Smokeless tobacco: Never Used  . Alcohol use No    Review of Systems  Constitutional: Negative for fever. Eyes: Negative for visual changes. ENT: Negative for sore throat. Cardiovascular: Negative for chest pain. Respiratory: Negative for shortness of breath. Gastrointestinal: Negative for abdominal pain, vomiting or diarrhea. Genitourinary: Negative for dysuria. Musculoskeletal: Negative for back pain. + L ankle/foot pain, swelling Skin: Negative for rash. Neurological: Negative for headaches, weakness or  numbness.  ____________________________________________   PHYSICAL EXAM:  VITAL SIGNS: ED Triage Vitals  Enc Vitals Group     BP 08/18/16 1308 (!) 111/58     Pulse Rate 08/18/16 1308 75     Resp 08/18/16 1308 18     Temp 08/18/16 1308 97.6 F (36.4 C)     Temp Source 08/18/16 1308 Oral     SpO2 08/18/16 1308 95 %     Weight 08/18/16 1308 189 lb (85.7 kg)     Height 08/18/16 1308 '5\' 4"'$  (1.626 m)     Head Circumference --      Peak Flow --      Pain Score 08/18/16 1316 8     Pain Loc --      Pain Edu? --      Excl. in [place]? --     Constitutional: Alert and oriented. Well appearing and in no apparent distress. HEENT:      Head: Normocephalic and atraumatic.  Eyes: Conjunctivae are normal. Sclera is non-icteric. EOMI. PERRL      Mouth/Throat: Mucous membranes are moist.       Neck: Supple with no signs of meningismus. Cardiovascular: Regular rate and rhythm. No murmurs, gallops, or rubs. 2+ symmetrical distal pulses are present in all extremities. No JVD. Respiratory: Normal respiratory effort. Lungs are clear to auscultation bilaterally. No wheezes, crackles, or rhonchi.  Musculoskeletal: Asymmetric swelling of LLE with pitting edema over the ankle and trace edema on the shin. Neurologic: Normal speech and language. Face is symmetric. Moving all extremities. No gross focal neurologic deficits are appreciated. Skin: Warmth and erythema overlying the medial aspect of the L ankle and dorsum of the left foot which is warm to the touch Psychiatric: Mood and affect are normal. Speech and behavior are normal.  ____________________________________________   LABS (all labs ordered are listed, but only abnormal results are displayed)  Labs Reviewed  BASIC METABOLIC PANEL - Abnormal; Notable for the following:       Result Value   Glucose, Bld 103 (*)    BUN 30 (*)    Creatinine, Ser 1.73 (*)    GFR calc non Af Amer 27 (*)    GFR calc Af Amer 31 (*)    All other components  within normal limits  CBC - Abnormal; Notable for the following:    MCH 34.6 (*)    All other components within normal limits   ____________________________________________  EKG  none ____________________________________________  RADIOLOGY  Doppler US: Negative ____________________________________________   PROCEDURES  Procedure(s) performed: None Procedures Critical Care performed:  None ____________________________________________   INITIAL IMPRESSION / ASSESSMENT AND PLAN / ED COURSE  81 y.o. female a history of arthritis, breast cancer now on remission, CKD, hypertension, hyperlipidemia, CAD s/p STEMI in 05/2016 who presents for evaluation of one week of left foot/ankle pain, now with swelling, warmth and erythema overlying the medial aspect of the left ankle and dorsum of the foot. There is mild asymmetric pitting edema of the left lower extremity when compared to the right. Patient has a history of cancer in the past and therefore will get Doppler ultrasound to rule out a DVT. Presentation most likely concern for cellulitis. No systemic symptoms. Normal white count and afebrile. We'll start patient on Keflex.  Clinical Course  Comment By Time  Doppler negative for DVT. Patient was given Ancef. We'll discharge home on Keflex. Area was demarcated. Patient was encouraged to return to the emergency room if the redness is spreading or if she develops a fever. Otherwise patient will follow up with primary care doctor on Monday. Rudene Re, MD 08/26 1815    Pertinent labs & imaging results that were available during my care of the patient were reviewed by me and considered in my medical decision making (see chart for details).    ____________________________________________   FINAL CLINICAL IMPRESSION(S) / ED DIAGNOSES  Final diagnoses:  Cellulitis of left lower extremity      NEW MEDICATIONS STARTED DURING THIS VISIT:  New Prescriptions   CEPHALEXIN  (KEFLEX) 500 MG CAPSULE    Take 1 capsule (500 mg total) by mouth 4 (four) times daily.     Note:  This document was prepared using Dragon voice recognition software and may include unintentional dictation errors.    Rudene Re, MD 08/18/16 [phone]

## 2016-08-18 NOTE — Discharge Instructions (Signed)

## 2016-08-18 NOTE — ED Triage Notes (Signed)
Pt to ED from [facility] c/o left foot pain that started last Sunday and progressively gotten worse.  Pt states started out as knot medial left foot and noticed swelling and redness to foot yesterday.  Pt denies injury to foot, denies diabetes.  Reports intermittent numbness and tingling and feeling of sheets across foot is painful.  Pt states given tramadol 50mg  without much relief.  Hx of HA in June.  Left foot swollen, red, pulses palpated, A&Ox4, speaking in complete and coherent sentences.

## 2016-08-23 ENCOUNTER — Encounter: Payer: Self-pay | Admitting: Dietician

## 2016-08-26 ENCOUNTER — Encounter: Payer: Self-pay | Admitting: Emergency Medicine

## 2016-08-26 ENCOUNTER — Emergency Department
Admission: EM | Admit: 2016-08-26 | Discharge: 2016-08-26 | Disposition: A | Discharge status: Home / Self Care | Payer: Medicare Other | Attending: Emergency Medicine | Admitting: Emergency Medicine

## 2016-08-26 DIAGNOSIS — Z79899 Other long term (current) drug therapy: Secondary | ICD-10-CM | POA: Insufficient documentation

## 2016-08-26 DIAGNOSIS — I129 Hypertensive chronic kidney disease with stage 1 through stage 4 chronic kidney disease, or unspecified chronic kidney disease: Secondary | ICD-10-CM | POA: Diagnosis not present

## 2016-08-26 DIAGNOSIS — Z7982 Long term (current) use of aspirin: Secondary | ICD-10-CM | POA: Insufficient documentation

## 2016-08-26 DIAGNOSIS — L509 Urticaria, unspecified: Secondary | ICD-10-CM | POA: Insufficient documentation

## 2016-08-26 DIAGNOSIS — Z853 Personal history of malignant neoplasm of breast: Secondary | ICD-10-CM | POA: Insufficient documentation

## 2016-08-26 DIAGNOSIS — Z85828 Personal history of other malignant neoplasm of skin: Secondary | ICD-10-CM | POA: Insufficient documentation

## 2016-08-26 DIAGNOSIS — N189 Chronic kidney disease, unspecified: Secondary | ICD-10-CM | POA: Diagnosis not present

## 2016-08-26 MED ORDER — SULFAMETHOXAZOLE-TRIMETHOPRIM 800-160 MG PO TABS: 1.0000 | ORAL_TABLET | Freq: Two times a day (BID) | ORAL | 0 refills | Status: DC

## 2016-08-26 MED ORDER — TRIAMCINOLONE ACETONIDE 0.1 % EX CREA: 1.0000 "application " | TOPICAL_CREAM | Freq: Four times a day (QID) | CUTANEOUS | 0 refills | Status: DC

## 2016-08-26 MED ORDER — FAMOTIDINE 20 MG PO TABS
20.0000 mg | ORAL_TABLET | Freq: Once | ORAL | Status: AC
  Administered 2016-08-26: 20 mg via ORAL
  Filled 2016-08-26: qty 1

## 2016-08-26 MED ORDER — PREDNISONE 10 MG PO TABS: 10.0000 mg | ORAL_TABLET | ORAL | 0 refills | Status: DC

## 2016-08-26 MED ORDER — METHYLPREDNISOLONE SODIUM SUCC 125 MG IJ SOLR
125.0000 mg | Freq: Once | INTRAMUSCULAR | Status: AC
  Administered 2016-08-26: 125 mg via INTRAMUSCULAR
  Filled 2016-08-26: qty 2

## 2016-08-26 NOTE — ED Triage Notes (Signed)
Pt states she is on an atbx for cellulitis and states she noticed hives developing on abdomen, lower extremities and upper extremities.  Pt c/o itching. Pt is allergic benadryl. Pt states she has been using cortisone for relief.

## 2016-08-26 NOTE — ED Provider Notes (Signed)
[facility] Emergency Department Provider Note  ____________________________________________  Time seen: Approximately 5:47 PM  I have reviewed the triage vital signs and the nursing notes.   HISTORY  Chief Complaint Urticaria    HPI Hailey Ellison is a 81 y.o. female who presents emergency department complaining of generalized hives. Patient reports that she first noticed hives 4 days prior. She states that there was too on her lower extremity. Patient was being treated for cellulitis to the left foot with antibiotics. Patient reports that she's use hydrocortisone cream on the hives on the lower extremity. Today, however symptoms worsened and she has generalized eyes. Patient did not finish her antibiotics today. Patient has 2 days left antibiotics. Patient denies any other changes in foods or medications. No new daily products, soaps or shampoos, detergents. Patient denies any airway complaints.   Past Medical History:  Diagnosis Date  . Arthritis   . Breast cancer ([place])    Adjuvant chemotherapy with Taxotere/Carboplatin/Herceptin. Arimidex initiated November 2011  . Cancer ([place]) 2010   T1c,N2a, M0; ER 90%, PR 60%,  HER-2/neu 2+ IHC, equivocal on fish. 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  Margins clear.  . Chronic kidney disease 2014  . Heart attack ([place]) 06/09/2016  . Hypertension   . Lymphedema   . Murmur   . Osteoporosis   . Radiation 2010   with chemo  . Squamous cell carcinoma [facility]) June 2016   Left upper inner chest wall, area of previous radiation.    Patient Active Problem List   Diagnosis Date Noted  . ST elevation myocardial infarction (STEMI) of inferior wall ([place]) 06/08/2016  . ST elevation myocardial infarction involving right coronary artery ([place])   . Breast mass, left 03/27/2016  . Fat necrosis (segmental)  of breast 07/26/2015  . Squamous cell carcinoma in situ 05/25/2015  . Skin nodule 05/14/2015  . Lymphedema 08/05/2014  . Lump or mass in breast 07/08/2014  . Personal history of malignant neoplasm of breast 05/25/2014  . Breast cancer, stage 2 ([place]) 04/08/2013    Past Surgical History:  Procedure Laterality Date  . BREAST BIOPSY Left 2010  . BREAST BIOPSY Right   . BREAST CYST EXCISION    . BREAST LUMPECTOMY Left 2010  . CARDIAC CATHETERIZATION N/A 06/08/2016   Procedure: Left Heart Cath and Coronary Angiography;  Surgeon: Wellington Hampshire, MD;  Location: [place] [facility];  Service: Cardiovascular;  Laterality: N/A;  . CARDIAC CATHETERIZATION N/A 06/08/2016   Procedure: Coronary Stent Intervention;  Surgeon: Wellington Hampshire, MD;  Location: [place] [facility];  Service: Cardiovascular;  Laterality: N/A;  . CARPAL TUNNEL RELEASE Bilateral   . CHOLECYSTECTOMY    . JOINT REPLACEMENT Left 2008  . PARTIAL HYSTERECTOMY  1980's  . PORT-A-CATH REMOVAL      Prior to Admission medications   Medication Sig Start Date End Date Taking? Authorizing Provider  acetaminophen (TYLENOL) 650 MG CR tablet Take by mouth.    Historical Provider, MD  anastrozole (ARIMIDEX) 1 MG tablet Take 1 mg by mouth daily.    Historical Provider, MD  aspirin 81 MG chewable tablet Chew 1 tablet (81 mg total) by mouth daily. 06/10/16   Demetrios Loll, MD  atorvastatin (LIPITOR) 40 MG tablet Take 1 tablet (40 mg total) by mouth at bedtime. 06/10/16   Demetrios Loll, MD  calcium citrate-vitamin D (CITRACAL+D) 315-200 MG-UNIT  per tablet Take 1 tablet by mouth 2 (two) times daily.    Historical Provider, MD  dipyridamole-aspirin (AGGRENOX) 200-25 MG per 12 hr capsule Take 1 capsule by mouth 2 (two) times daily. Reported on 06/25/2016    Historical Provider, MD  furosemide (LASIX) 20 MG tablet Take 20 mg by mouth daily as needed for fluid.     Historical Provider, MD  glucosamine-chondroitin 500-400 MG tablet Take 2 tablets by mouth  daily.    Historical Provider, MD  hydrochlorothiazide (HYDRODIURIL) 25 MG tablet Take 0.5 tablets (12.5 mg total) by mouth daily. Patient not taking: Reported on 06/25/2016 06/10/16   Demetrios Loll, MD  lansoprazole (PREVACID) 30 MG capsule Take 30 mg by mouth daily.    Historical Provider, MD  Multiple Vitamin (MULTIVITAMIN WITH MINERALS) TABS Take 1 tablet by mouth daily.    Historical Provider, MD  PARoxetine (PAXIL) 40 MG tablet Take 40 mg by mouth daily.    Historical Provider, MD  predniSONE (DELTASONE) 10 MG tablet Take 1 tablet (10 mg total) by mouth as directed. Take on daily regimen of 6, 5, 4, 3, 2, 1 08/26/16   Roderic Palau D Cuthriell, PA-C  sulfamethoxazole-trimethoprim (BACTRIM DS,SEPTRA DS) 800-160 MG tablet Take 1 tablet by mouth 2 (two) times daily. 08/26/16 08/31/16  Roderic Palau D Cuthriell, PA-C  ticagrelor (BRILINTA) 90 MG TABS tablet Take 1 tablet (90 mg total) by mouth 2 (two) times daily. 06/10/16   Demetrios Loll, MD  triamcinolone cream (KENALOG) 0.1 % Apply 1 application topically 4 (four) times daily. 08/26/16   Charline Bills Cuthriell, PA-C  valsartan (DIOVAN) 320 MG tablet Take 320 mg by mouth daily.    Historical Provider, MD  verapamil (CALAN) 120 MG tablet Take 60 mg by mouth daily.    Historical Provider, MD    Allergies Benadryl [diphenhydramine hcl]; Tape; Tegaderm ag mesh [silver]; and Neosporin [neomycin-bacitracin zn-polymyx]  Family History  Problem Relation Age of Onset  . Cancer Sister     breast  . Cancer Sister     kidney    Social History Social History  Substance Use Topics  . Smoking status: Former Research scientist (life sciences)  . Smokeless tobacco: Never Used  . Alcohol use No     Review of Systems  Constitutional: No fever/chills ENT: No upper respiratory complaints. Cardiovascular: no chest pain. Respiratory: no cough. No SOB. Gastrointestinal: No abdominal pain.  No nausea, no vomiting.  No diarrhea.  No constipation. Musculoskeletal: Negative for musculoskeletal pain. Skin:  Negative for rash, abrasions, lacerations, ecchymosis.Positive for generalized hives to torso, extremities. No facial involvement. Neurological: Negative for headaches, focal weakness or numbness. 10-point ROS otherwise negative.  ____________________________________________   PHYSICAL EXAM:  VITAL SIGNS: ED Triage Vitals  Enc Vitals Group     BP 08/26/16 1724 (!) 153/60     Pulse Rate 08/26/16 1724 60     Resp 08/26/16 1724 18     Temp 08/26/16 1724 98.2 F (36.8 C)     Temp Source 08/26/16 1724 Oral     SpO2 08/26/16 1724 98 %     Weight 08/26/16 1725 183 lb (83 kg)     Height 08/26/16 1725 _0  (1.626 m)     Head Circumference --      Peak Flow --      Pain Score 08/26/16 1725 0     Pain Loc --      Pain Edu? --      Excl. in [place]? --      Constitutional:  Alert and oriented. Well appearing and in no acute distress. Eyes: Conjunctivae are normal. PERRL. EOMI. Head: Atraumatic. ENT:      Ears:       Nose: No congestion/rhinnorhea.      Mouth/Throat: Mucous membranes are moist. Oropharynx nonerythematous and nonedematous. Uvula is midline. Neck: No stridor.    Cardiovascular: Normal rate, regular rhythm. Normal S1 and S2.  Good peripheral circulation. Respiratory: Normal respiratory effort without tachypnea or retractions. Lungs CTAB. Good air entry to the bases with no decreased or absent breath sounds. Musculoskeletal: Full range of motion to all extremities. No gross deformities appreciated. Neurologic:  Normal speech and language. No gross focal neurologic deficits are appreciated.  Skin:  Skin is warm, dry and intact. No rash noted. Scattered hives are visualized over bilateral lower shoulder raise, torso, pressure days. Minor excoriations from scratching. Cellulitis to right foot appears to be improving. Small, 1 cm in diameter reddened lesion noted to medial left foot. Patient still has permanent marker outlines from previous visit. This has drastically  improved. Psychiatric: Mood and affect are normal. Speech and behavior are normal. Patient exhibits appropriate insight and judgement.   ____________________________________________   LABS (all labs ordered are listed, but only abnormal results are displayed)  Labs Reviewed - No data to display ____________________________________________  EKG   ____________________________________________  RADIOLOGY   No results found.  ____________________________________________    PROCEDURES  Procedure(s) performed:    Procedures    Medications  famotidine (PEPCID) tablet 20 mg (not administered)  methylPREDNISolone sodium succinate (SOLU-MEDROL) 125 mg/2 mL injection 125 mg (not administered)     ____________________________________________   INITIAL IMPRESSION / ASSESSMENT AND PLAN / ED COURSE  Pertinent labs & imaging results that were available during my care of the patient were reviewed by me and considered in my medical decision making (see chart for details).  Review of the [place] CSRS was performed in accordance of the [place] prior to dispensing any controlled drugs.  Clinical Course    Patient's diagnosis is consistent with Hives. This is unknown origin. Patient has been taking antibiotics for over a week prior to symptoms occurring. All pattern is not completely typical for drug rash, patient's antibiotics will be stopped and she will be placed on new antibiotics. Patient is allergic to Benadryl cannot receive this medication. As such, patient will be given an injection of steroids and famotidine in the emergency department.. Patient will be discharged home with prescriptions for a few days supply of Bactrim to complete all antibiotic course, oral steroids, topical steroid for symptom control. Patient is to follow up with primary care as needed or otherwise directed. Patient is given ED precautions to return to the ED for any worsening or new  symptoms.     ____________________________________________  FINAL CLINICAL IMPRESSION(S) / ED DIAGNOSES  Final diagnoses:  Hives      NEW MEDICATIONS STARTED DURING THIS VISIT:  New Prescriptions   PREDNISONE (DELTASONE) 10 MG TABLET    Take 1 tablet (10 mg total) by mouth as directed. Take on daily regimen of 6, 5, 4, 3, 2, 1   SULFAMETHOXAZOLE-TRIMETHOPRIM (BACTRIM DS,SEPTRA DS) 800-160 MG TABLET    Take 1 tablet by mouth 2 (two) times daily.   TRIAMCINOLONE CREAM (KENALOG) 0.1 %    Apply 1 application topically 4 (four) times daily.        This chart was dictated using voice recognition software/Dragon. Despite best efforts to proofread, errors can occur which can change the meaning. Any  change was purely unintentional.    Darletta Moll, PA-C 08/26/16 1815    Orbie Pyo, MD 08/26/16 [phone]

## 2016-08-29 ENCOUNTER — Telehealth: Payer: Self-pay | Admitting: *Deleted

## 2016-08-29 ENCOUNTER — Encounter: Payer: Self-pay | Admitting: *Deleted

## 2016-08-29 NOTE — Telephone Encounter (Signed)
Hailey Ellison called and said she is sorry that she has been out but went to the Emerg Dept twice with cellulitis of her left foot. She said she has hives from the antibiotic. Hailey Ellison said she hopes to be back in Cardiac Rehab.

## 2016-08-30 ENCOUNTER — Other Ambulatory Visit: Payer: Self-pay | Admitting: *Deleted

## 2016-08-30 DIAGNOSIS — Z853 Personal history of malignant neoplasm of breast: Secondary | ICD-10-CM

## 2016-08-31 ENCOUNTER — Other Ambulatory Visit: Payer: Self-pay | Admitting: *Deleted

## 2016-08-31 ENCOUNTER — Ambulatory Visit: Payer: Medicare Other

## 2016-08-31 ENCOUNTER — Other Ambulatory Visit: Payer: Self-pay

## 2016-08-31 ENCOUNTER — Inpatient Hospital Stay (HOSPITAL_BASED_OUTPATIENT_CLINIC_OR_DEPARTMENT_OTHER): Payer: Medicare Other | Admitting: Hematology and Oncology

## 2016-08-31 ENCOUNTER — Inpatient Hospital Stay: Payer: Medicare Other | Attending: Hematology and Oncology

## 2016-08-31 VITALS — BP 163/79 | HR 75 | Temp 96.2°F | Resp 18 | Wt 186.9 lb

## 2016-08-31 DIAGNOSIS — Z79811 Long term (current) use of aromatase inhibitors: Secondary | ICD-10-CM

## 2016-08-31 DIAGNOSIS — Z79899 Other long term (current) drug therapy: Secondary | ICD-10-CM

## 2016-08-31 DIAGNOSIS — M810 Age-related osteoporosis without current pathological fracture: Secondary | ICD-10-CM | POA: Insufficient documentation

## 2016-08-31 DIAGNOSIS — N189 Chronic kidney disease, unspecified: Secondary | ICD-10-CM | POA: Insufficient documentation

## 2016-08-31 DIAGNOSIS — Z170 Estrogen receptor positive status [ER+]: Secondary | ICD-10-CM

## 2016-08-31 DIAGNOSIS — I252 Old myocardial infarction: Secondary | ICD-10-CM | POA: Insufficient documentation

## 2016-08-31 DIAGNOSIS — C50912 Malignant neoplasm of unspecified site of left female breast: Secondary | ICD-10-CM | POA: Diagnosis not present

## 2016-08-31 DIAGNOSIS — Z9221 Personal history of antineoplastic chemotherapy: Secondary | ICD-10-CM | POA: Diagnosis not present

## 2016-08-31 DIAGNOSIS — I890 Lymphedema, not elsewhere classified: Secondary | ICD-10-CM

## 2016-08-31 DIAGNOSIS — N641 Fat necrosis of breast: Secondary | ICD-10-CM | POA: Insufficient documentation

## 2016-08-31 DIAGNOSIS — Z7952 Long term (current) use of systemic steroids: Secondary | ICD-10-CM | POA: Diagnosis not present

## 2016-08-31 DIAGNOSIS — Z923 Personal history of irradiation: Secondary | ICD-10-CM

## 2016-08-31 DIAGNOSIS — Z7982 Long term (current) use of aspirin: Secondary | ICD-10-CM | POA: Insufficient documentation

## 2016-08-31 DIAGNOSIS — M1990 Unspecified osteoarthritis, unspecified site: Secondary | ICD-10-CM

## 2016-08-31 DIAGNOSIS — I129 Hypertensive chronic kidney disease with stage 1 through stage 4 chronic kidney disease, or unspecified chronic kidney disease: Secondary | ICD-10-CM | POA: Diagnosis not present

## 2016-08-31 DIAGNOSIS — Z853 Personal history of malignant neoplasm of breast: Secondary | ICD-10-CM

## 2016-08-31 DIAGNOSIS — Z87891 Personal history of nicotine dependence: Secondary | ICD-10-CM | POA: Insufficient documentation

## 2016-08-31 LAB — CBC WITH DIFFERENTIAL/PLATELET
Basophils Absolute: 0 10*3/uL (ref 0–0.1)
Basophils Relative: 0 %
Eosinophils Absolute: 0 10*3/uL (ref 0–0.7)
Eosinophils Relative: 0 %
HCT: 35 % (ref 35.0–47.0)
Hemoglobin: 12.3 g/dL (ref 12.0–16.0)
Lymphocytes Relative: 8 %
Lymphs Abs: 0.6 10*3/uL — ABNORMAL LOW (ref 1.0–3.6)
MCH: 34 pg (ref 26.0–34.0)
MCHC: 35.2 g/dL (ref 32.0–36.0)
MCV: 96.4 fL (ref 80.0–100.0)
Monocytes Absolute: 0.3 10*3/uL (ref 0.2–0.9)
Monocytes Relative: 4 %
Neutro Abs: 7.2 10*3/uL — ABNORMAL HIGH (ref 1.4–6.5)
Neutrophils Relative %: 88 %
Platelets: 201 10*3/uL (ref 150–440)
RBC: 3.63 MIL/uL — ABNORMAL LOW (ref 3.80–5.20)
RDW: 12.6 % (ref 11.5–14.5)
WBC: 8.2 10*3/uL (ref 3.6–11.0)

## 2016-08-31 LAB — CREATININE, SERUM
Creatinine, Ser: 1.62 mg/dL — ABNORMAL HIGH (ref 0.44–1.00)
GFR calc Af Amer: 34 mL/min — ABNORMAL LOW (ref >60)
GFR calc non Af Amer: 29 mL/min — ABNORMAL LOW (ref >60)

## 2016-08-31 LAB — HEPATIC FUNCTION PANEL
ALT: 29 U/L (ref 14–54)
AST: 31 U/L (ref 15–41)
Albumin: 4 g/dL (ref 3.5–5.0)
Alkaline Phosphatase: 59 U/L (ref 38–126)
Bilirubin, Direct: <0.1 mg/dL — ABNORMAL LOW (ref 0.1–0.5)
Total Bilirubin: 0.4 mg/dL (ref 0.3–1.2)
Total Protein: 6.6 g/dL (ref 6.5–8.1)

## 2016-08-31 NOTE — Progress Notes (Signed)
Patient states she had a heart attack in June.  She had cellulitis in her left foot in July.  Last week she broke out in hives and is currently on prednisone taper.  States she is currently in heart rehab.  Patitent has an area in her left breast that she would like to have evaluated.  She states it is a tiny hole and has been evaluated by Dr. Bary Castilla several times.

## 2016-08-31 NOTE — Progress Notes (Signed)
[facility] day:  08/31/2016  Chief Complaint: Hailey Ellison is a 81 y.o. female with stage II left breast cancer who is seen for reassessment.  HPI: The patient was diagnosed with breast cancer 2010 following a screening mammogram.  Breast biopsy on 02/21/2009 revealed grade II invasive ductal carcinoma with focal micropapillary features.  There was solid, nuclear grade 3 necrosis DCIS.  Left breast wide excision by Dr. Bary Castilla on 03/09/2009 revealed a 1.2 cm grade III invasive ductal carcinoma with micropapillary features.  One of 2 sentinel lymph nodes and 5 of 13 axillary lymph nodes were positive.  The size of the largest metastatic deposit was 1.2 cm There was lymph-vascular invasion. Estrogen receptor was positive (greater than 90%), progesterone receptor positive (60%) and HER-2/neu 2+ by IHC and equivocal by FISH.  She was treated with 6 cycles of [place] followed by a year of adjuvant Herceptin (last 05/01/2010).  During her treatment she describes an admission for diarrhea.  She received radiation following completion of [employer].  She began anastazole in 07/2010.  She has chronic left breast drainage for which she is followed by Dr. Bary Castilla.  Notes from 02/22/2016 revealed drainage from fat necrosis. Options discussed included wide excision versus observation.   He was last seen in medical oncology clinic on 09/02/2015 by Dr. Ma Hillock.  Mammogram on 04/07/2015 was negative. CA 27-29 was 24.4 on 09/02/2015.   Bone density study in 05/2013 revealed osteopenia with T score of -1.7 in the right femoral neck. Bone density study on 09/14/2015 revealed a T score -1.8 in the left femoral neck.  She began Prolia twice a year (last received 08/12/2016).   The patient has kidney disease. She is followed by Dr.Stoioff for a right kidney mass.  She notes that since last being seen, she had a myocardial infarction in 05/2016.  She had cellulitis in 08/2016.  Antibiotics  caused hives.  She notes ongoing issues with lymphedema.  She denies any breast concerns.   Past Medical History:  Diagnosis Date  . Arthritis   . Breast cancer ([place])    Adjuvant chemotherapy with Taxotere/Carboplatin/Herceptin. Arimidex initiated November 2011  . Cancer ([place]) 2010   T1c,N2a, M0; ER 90%, PR 60%,  HER-2/neu 2+ IHC, equivocal on fish. 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  Margins clear.  . Chronic kidney disease 2014  . Heart attack ([place]) 06/09/2016  . Hypertension   . Lymphedema   . Murmur   . Osteoporosis   . Radiation 2010   with chemo  . Squamous cell carcinoma [facility]) June 2016   Left upper inner chest wall, area of previous radiation.    Past Surgical History:  Procedure Laterality Date  . BREAST BIOPSY Left 2010  . BREAST BIOPSY Right   . BREAST CYST EXCISION    . BREAST LUMPECTOMY Left 2010  . CARDIAC CATHETERIZATION N/A 06/08/2016   Procedure: Left Heart Cath and Coronary Angiography;  Surgeon: Wellington Hampshire, MD;  Location: [place] [facility];  Service: Cardiovascular;  Laterality: N/A;  . CARDIAC CATHETERIZATION N/A 06/08/2016   Procedure: Coronary Stent Intervention;  Surgeon: Wellington Hampshire, MD;  Location: [place] [facility];  Service: Cardiovascular;  Laterality: N/A;  . CARPAL TUNNEL RELEASE Bilateral   . CHOLECYSTECTOMY    . JOINT REPLACEMENT Left 2008  . PARTIAL HYSTERECTOMY  1980's  .  PORT-A-CATH REMOVAL      Family History  Problem Relation Age of Onset  . Cancer Sister     breast  . Cancer Sister     kidney    Social History:  reports that she has quit smoking. She has never used smokeless tobacco. She reports that she does not drink alcohol or use drugs.  She lives in [place].  The patient is alone today.  Allergies:  Allergies  Allergen Reactions  . Alendronate Shortness Of Breath  . Benadryl  [Diphenhydramine Hcl] Hives  . Tape   . Tegaderm Ag Mesh [Silver]     tegaderm  . Neomycin-Bacitracin-Polymyxin  [Bacitracin-Neomycin-Polymyxin] Rash  . Neosporin [Neomycin-Bacitracin Zn-Polymyx] Rash  . Sulfa Antibiotics Rash    Current Medications: Current Outpatient Prescriptions  Medication Sig Dispense Refill  . anastrozole (ARIMIDEX) 1 MG tablet Take 1 mg by mouth daily.    Marland Kitchen aspirin 81 MG chewable tablet Chew 1 tablet (81 mg total) by mouth daily. 30 tablet 2  . atorvastatin (LIPITOR) 40 MG tablet Take 1 tablet (40 mg total) by mouth at bedtime. 30 tablet 2  . calcium citrate-vitamin D (CITRACAL+D) 315-200 MG-UNIT per tablet Take 1 tablet by mouth 2 (two) times daily.    . furosemide (LASIX) 20 MG tablet Take 20 mg by mouth daily as needed for fluid.     Marland Kitchen glucosamine-chondroitin 500-400 MG tablet Take 2 tablets by mouth daily.    . lansoprazole (PREVACID) 30 MG capsule Take 30 mg by mouth daily.    . Multiple Vitamin (MULTIVITAMIN WITH MINERALS) TABS Take 1 tablet by mouth daily.    Marland Kitchen PARoxetine (PAXIL) 40 MG tablet Take 40 mg by mouth daily.    . predniSONE (DELTASONE) 10 MG tablet Take 1 tablet (10 mg total) by mouth as directed. Take on daily regimen of 6, 5, 4, 3, 2, 1 21 tablet 0  . ticagrelor (BRILINTA) 90 MG TABS tablet Take 1 tablet (90 mg total) by mouth 2 (two) times daily. 60 tablet 0  . triamcinolone cream (KENALOG) 0.1 % Apply 1 application topically 4 (four) times daily. 30 g 0  . valsartan (DIOVAN) 320 MG tablet Take 320 mg by mouth daily.    . verapamil (CALAN) 120 MG tablet Take 60 mg by mouth daily.     No current facility-administered medications for this visit.     Review of Systems:  GENERAL:  Feels "ok".  No fevers, sweats or weight loss. PERFORMANCE STATUS (ECOG):  1 HEENT:  No visual changes, runny nose, sore throat, mouth sores or tenderness. Lungs: No shortness of breath or cough.  No hemoptysis. Cardiac:  No chest pain, palpitations, orthopnea, or  PND. GI:  No nausea, vomiting, diarrhea, constipation, melena or hematochezia. GU:  No urgency, frequency, dysuria, or hematuria. Musculoskeletal:  No back pain.  No joint pain.  No muscle tenderness. Extremities:  No pain or swelling. Skin:  Resolving hives s/p antibiotics. Neuro:  No headache, numbness or weakness, balance or coordination issues. Endocrine:  No diabetes, thyroid issues, hot flashes or night sweats. Psych:  No mood changes, depression or anxiety. Pain:  No focal pain. Review of systems:  All other systems reviewed and found to be negative.  Physical Exam: Blood pressure (!) 163/79, pulse 75, temperature (!) 96.2 F (35.7 C), temperature source Tympanic, resp. rate 18, weight 186 lb 15.2 oz (84.8 kg). GENERAL:  Well developed, well nourished, sitting comfortably in the exam room in no acute distress. MENTAL STATUS:  Alert  and oriented to person, place and time. HEAD:  Short gray hair.  Normocephalic, atraumatic, face symmetric, no Cushingoid features. EYES:  Glasses.  Blue eyes.  Pupils equal round and reactive to light and accomodation.  No conjunctivitis or scleral icterus. ENT:  Oropharynx clear without lesion.  Tongue normal. Mucous membranes moist.  RESPIRATORY:  Clear to auscultation without rales, wheezes or rhonchi. CARDIOVASCULAR:  Regular rate and rhythm without murmur, rub or gallop. BREAST:  Right breast without masses, skin changes or nipple discharge.  Left breast with small bandage superiorly.  Significant upper quadrant scarring with small hole under bandage.  No discrete masses, skin changes or nipple discharge.  ABDOMEN:  Soft, non-tender, with active bowel sounds, and no hepatosplenomegaly.  No masses. SKIN:  No rashes, ulcers or lesions.  Several small flat hyperpigmented lesions s/p hives. EXTREMITIES: Wearing a left upper extremity lymphedema sleeve.  No lower extremity edema, no skin discoloration or tenderness.  No palpable cords. LYMPH NODES: No  palpable cervical, supraclavicular, axillary or inguinal adenopathy  NEUROLOGICAL: Unremarkable. PSYCH:  Appropriate.   Orders Only on 08/31/2016  Component Date Value Ref Range Status  . WBC 08/31/2016 8.2  3.6 - 11.0 K/uL Final  . RBC 08/31/2016 3.63* 3.80 - 5.20 MIL/uL Final  . Hemoglobin 08/31/2016 12.3  12.0 - 16.0 g/dL Final  . HCT 08/31/2016 35.0  35.0 - 47.0 % Final  . MCV 08/31/2016 96.4  80.0 - 100.0 fL Final  . MCH 08/31/2016 34.0  26.0 - 34.0 pg Final  . MCHC 08/31/2016 35.2  32.0 - 36.0 g/dL Final  . RDW 08/31/2016 12.6  11.5 - 14.5 % Final  . Platelets 08/31/2016 201  150 - 440 K/uL Final  . Neutrophils Relative % 08/31/2016 88  % Final  . Neutro Abs 08/31/2016 7.2* 1.4 - 6.5 K/uL Final  . Lymphocytes Relative 08/31/2016 8  % Final  . Lymphs Abs 08/31/2016 0.6* 1.0 - 3.6 K/uL Final  . Monocytes Relative 08/31/2016 4  % Final  . Monocytes Absolute 08/31/2016 0.3  0.2 - 0.9 K/uL Final  . Eosinophils Relative 08/31/2016 0  % Final  . Eosinophils Absolute 08/31/2016 0.0  0 - 0.7 K/uL Final  . Basophils Relative 08/31/2016 0  % Final  . Basophils Absolute 08/31/2016 0.0  0 - 0.1 K/uL Final    Assessment:  Hailey Ellison is a 81 y.o. female with stage II left breast cancer s/p wide excision on 03/09/2009.  Pathology revealed a 1.2 cm grade III invasive ductal carcinoma with micropapillary features.  One of 2 sentinel lymph nodes and 5 of 13 axillary lymph nodes were positive.  The size of the largest metastatic deposit was 1.2 cm There was lymph-vascular invasion. Estrogen receptor was positive (greater than 90%), progesterone receptor positive (60%) and HER-2/neu 2+ by IHC and equivocal by FISH.  She was treated with 6 cycles of [place] followed by a year of adjuvant Herceptin (last 05/01/2010).  She received radiation following completion of [place].  She began Arimidex in 07/2010.  She has chronic left breast drainage for which she is followed by Dr. Bary Castilla.  CA 27-29 was 24.4 on  09/02/2015 and 27.7 on 08/31/2016.   Bone density study on 09/14/2015 revealed osteopenia with a T score -1.8 in the left femoral neck.  She began Prolia twice a year (last received 08/12/2016).   The patient has kidney disease. She is followed by Dr.Stoioff for a right kidney mass. He notes that since last being seen she had  a myocardial infarction in 05/2016. She had cellulitis in 08/2016.   Symptomatically, she has ongoing issues with lymphedema.  She denies any breast concerns.  Plan: 1.  Discuss entire medical history.  Discuss diagnosis, staging and management of breast cancer. 2.  Discuss BCI testing to assess benefit of extended adjuvant hormonal therapy. 3.  Labs today:  CBC with diff, CMP, CA27.29. 4.  Continue Arimidex. 5.  Referral to Lymphedema Clinic 6.  RTC in 1 year for MD assessment and labs (CBC with diff, CMP, CA27.29)   Lequita Asal, MD  08/31/2016, 4:02 PM

## 2016-09-01 LAB — CA 27.29 (SERIAL MONITOR): CA 27.29: 27.7 U/mL (ref 0.0–38.6)

## 2016-09-02 ENCOUNTER — Encounter: Payer: Self-pay | Admitting: Hematology and Oncology

## 2016-09-05 ENCOUNTER — Encounter: Payer: Self-pay | Admitting: *Deleted

## 2016-09-05 DIAGNOSIS — I213 ST elevation (STEMI) myocardial infarction of unspecified site: Secondary | ICD-10-CM

## 2016-09-05 DIAGNOSIS — Z955 Presence of coronary angioplasty implant and graft: Secondary | ICD-10-CM

## 2016-09-05 NOTE — Progress Notes (Signed)
Cardiac Individual Treatment Plan  Patient Details  Name: Hailey Ellison MRN: [id number] Date of Birth: June 25, 1935 Referring Provider:   Flowsheet Row Cardiac Rehab from 06/25/2016 in [facility] Cardiac and Pulmonary Rehab  Referring Provider  Arida      Initial Encounter Date:  Flowsheet Row Cardiac Rehab from 06/25/2016 in [facility] Cardiac and Pulmonary Rehab  Date  06/25/16  Referring Provider  Fletcher Anon      Visit Diagnosis: ST elevation myocardial infarction (STEMI), unspecified artery [facility])  Status post coronary artery stent placement  Patient's Home Medications on Admission:  Current Outpatient Prescriptions:  .  anastrozole (ARIMIDEX) 1 MG tablet, Take 1 mg by mouth daily., Disp: , Rfl:  .  aspirin 81 MG chewable tablet, Chew 1 tablet (81 mg total) by mouth daily., Disp: 30 tablet, Rfl: 2 .  atorvastatin (LIPITOR) 40 MG tablet, Take 1 tablet (40 mg total) by mouth at bedtime., Disp: 30 tablet, Rfl: 2 .  calcium citrate-vitamin D (CITRACAL+D) 315-200 MG-UNIT per tablet, Take 1 tablet by mouth 2 (two) times daily., Disp: , Rfl:  .  furosemide (LASIX) 20 MG tablet, Take 20 mg by mouth daily as needed for fluid. , Disp: , Rfl:  .  glucosamine-chondroitin 500-400 MG tablet, Take 2 tablets by mouth daily., Disp: , Rfl:  .  lansoprazole (PREVACID) 30 MG capsule, Take 30 mg by mouth daily., Disp: , Rfl:  .  Multiple Vitamin (MULTIVITAMIN WITH MINERALS) TABS, Take 1 tablet by mouth daily., Disp: , Rfl:  .  PARoxetine (PAXIL) 40 MG tablet, Take 40 mg by mouth daily., Disp: , Rfl:  .  predniSONE (DELTASONE) 10 MG tablet, Take 1 tablet (10 mg total) by mouth as directed. Take on daily regimen of 6, 5, 4, 3, 2, 1, Disp: 21 tablet, Rfl: 0 .  ticagrelor (BRILINTA) 90 MG TABS tablet, Take 1 tablet (90 mg total) by mouth 2 (two) times daily., Disp: 60 tablet, Rfl: 0 .  triamcinolone cream (KENALOG) 0.1 %, Apply 1 application topically 4 (four) times daily., Disp: 30 g, Rfl: 0 .  valsartan (DIOVAN) 320 MG  tablet, Take 320 mg by mouth daily., Disp: , Rfl:  .  verapamil (CALAN) 120 MG tablet, Take 60 mg by mouth daily., Disp: , Rfl:   Past Medical History: Past Medical History:  Diagnosis Date  . Arthritis   . Breast cancer ([place])    Adjuvant chemotherapy with Taxotere/Carboplatin/Herceptin. Arimidex initiated November 2011  . Cancer ([place]) 2010   T1c,N2a, M0; ER 90%, PR 60%,  HER-2/neu 2+ IHC, equivocal on fish. 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  Margins clear.  . Chronic kidney disease 2014  . Heart attack ([place]) 06/09/2016  . Hypertension   . Lymphedema   . Murmur   . Osteoporosis   . Radiation 2010   with chemo  . Squamous cell carcinoma [facility]) June 2016   Left upper inner chest wall, area of previous radiation.    Tobacco Use: History  Smoking Status  . Former Smoker  Smokeless Tobacco  . Never Used    Labs: Recent [employer] for ITP Cardiac and Pulmonary Rehab Latest Ref Rng & Units 06/08/2016   Cholestrol 0 - 200 mg/dL 135   LDLCALC 0 - 99 mg/dL 81   HDL >40 mg/dL 47   Trlycerides <150 mg/dL 37       Exercise  Target Goals:    Exercise Program Goal: Individual exercise prescription set with THRR, safety & activity barriers. Participant demonstrates ability to understand and report RPE using BORG scale, to self-measure pulse accurately, and to acknowledge the importance of the exercise prescription.  Exercise Prescription Goal: Starting with aerobic activity 30 plus minutes a day, 3 days per week for initial exercise prescription. Provide home exercise prescription and guidelines that participant acknowledges understanding prior to discharge.  Activity Barriers & Risk Stratification:     Activity Barriers & Cardiac Risk Stratification - 06/25/16 1458      Activity Barriers & Cardiac Risk Stratification   Activity  Barriers Joint Problems;Back Problems;Shortness of Breath;Deconditioning  Was to have left knee replacement and had heart attack week before scheduled surgery.  BAck problems are old and no concerns at present. Will let staff know if exercise flares back . SOB with exertion. Questions if may be deconditioning. Has osteoporosis   Cardiac Risk Stratification High      6 Minute Walk:     6 Minute Walk    Row Name 06/25/16 1424         6 Minute Walk   Phase Initial     Distance 900 feet     Walk Time 6 minutes     MPH 1.7     METS 1.5     RPE 13     VO2 Peak 5.3     Symptoms No     Resting HR 81 bpm     Resting BP 110/60     Max Ex. HR 110 bpm     Max Ex. BP 118/56        Initial Exercise Prescription:     Initial Exercise Prescription - 06/25/16 1400      Date of Initial Exercise RX and Referring Provider   Date 06/25/16   Referring Provider Arida     Recumbant Bike   Level 1   RPM 50   Watts 20   Minutes 15     NuStep   Level 1   Watts 25   Minutes 15     T5 Nustep   Level 1   Watts 25   Minutes 15     Biostep-RELP   Level 1   Watts 25   Minutes 15     Prescription Details   Frequency (times per week) 3   Duration Progress to 30 minutes of continuous aerobic without signs/symptoms of physical distress     Intensity   THRR 40-80% of Max Heartrate 105-128   Ratings of Perceived Exertion 11-13   Perceived Dyspnea 0-4     Progression   Progression Continue to progress workloads to maintain intensity without signs/symptoms of physical distress.     Resistance Training   Training Prescription Yes   Weight 1   Reps 10-15      Perform Capillary Blood Glucose checks as needed.  Exercise Prescription Changes:     Exercise Prescription Changes    Row Name 06/25/16 1400 08/16/16 1500           Exercise Review   Progression  - Yes        Response to Exercise   Blood Pressure (Admit) 110/60 138/62      Blood Pressure (Exercise) 118/56  120/68      Blood Pressure (Exit) 94/58 144/62      Heart Rate (Admit) 81 bpm 89 bpm      Heart Rate (Exercise) 110 bpm 89  bpm      Heart Rate (Exit) 73 bpm 75 bpm      Rating of Perceived Exertion (Exercise) 13 12        Progression   Average METs  - 1.85        Resistance Training   Training Prescription  - Yes      Weight  - 0  due to lymphedema      Reps  - 10-15        Interval Training   Interval Training  - No        NuStep   Level  - 1      Minutes  - 15        T5 Nustep   Level  - 1      Minutes  - 15         Exercise Comments:     Exercise Comments    Row Name 08/03/16 1030 08/16/16 1505 08/30/16 1122       Exercise Comments Ms Kelner has not attended the program since her medical review 06/25/16. Genoveva is progressing well with exercise. Aislin is currently not attending due to cellulitis.  She plans to return when cleared for exercise.        Discharge Exercise Prescription (Final Exercise Prescription Changes):     Exercise Prescription Changes - 08/16/16 1500      Exercise Review   Progression Yes     Response to Exercise   Blood Pressure (Admit) 138/62   Blood Pressure (Exercise) 120/68   Blood Pressure (Exit) 144/62   Heart Rate (Admit) 89 bpm   Heart Rate (Exercise) 89 bpm   Heart Rate (Exit) 75 bpm   Rating of Perceived Exertion (Exercise) 12     Progression   Average METs 1.85     Resistance Training   Training Prescription Yes   Weight 0  due to lymphedema   Reps 10-15     Interval Training   Interval Training No     NuStep   Level 1   Minutes 15     T5 Nustep   Level 1   Minutes 15      Nutrition:  Target Goals: Understanding of nutrition guidelines, daily intake of sodium <1550m, cholesterol <2033m calories 30% from fat and 7% or less from saturated fats, daily to have 5 or more servings of fruits and vegetables.  Biometrics:     Pre Biometrics - 06/25/16 1423      Pre Biometrics   Height 5' 4.5" (1.638 m)    Weight 184 lb 6.4 oz (83.6 kg)   Waist Circumference 42.5 inches   Hip Circumference 47.25 inches   Waist to Hip Ratio 0.9 %   BMI (Calculated) 31.2   Single Leg Stand 1.32 seconds       Nutrition Therapy Plan and Nutrition Goals:     Nutrition Therapy & Goals - 08/23/16 1810      Nutrition Therapy   Diet 1300kcal renal diet   Drug/Food Interactions Statins/Certain Fruits   Protein (specify units) 5-6oz   Fiber 20 grams   Whole Grain Foods 0 servings   Saturated Fats 11 max. grams   Fruits and Vegetables 8 servings/day   Sodium 1500 grams     Personal Nutrition Goals   Personal Goal #1 Try adding a boiled egg with your oatmeal at breakfast   Personal Goal #2 Keep beans, cheese, peanut butter to one serving daily (to keep phosphorus intake low)  Personal Goal #3 Try lightly-flavored water such as [employer]. Avoid Propel water (sodium, potassium)   Comments Patient has been overly restricting protein in effort to protect her kidneys. Advised 5-6oz of heart healthy proteins daily, discussed options. She is limiting most foods with saturated fat.      Intervention Plan   Intervention Prescribe, educate and counsel regarding individualized specific dietary modifications aiming towards targeted core components such as weight, hypertension, lipid management, diabetes, heart failure and other comorbidities.;Nutrition handout(s) given to patient.   Expected Outcomes Short Term Goal: Understand basic principles of dietary content, such as calories, fat, sodium, cholesterol and nutrients.;Short Term Goal: A plan has been developed with personal nutrition goals set during dietitian appointment.;Long Term Goal: Adherence to prescribed nutrition plan.      Nutrition Discharge: Rate Your Plate Scores:     Nutrition Assessments - 08/23/16 1815      Rate Your Plate Scores   Pre Score 58   Pre Score % 64 %      Nutrition Goals Re-Evaluation:   Psychosocial: Target Goals:  Acknowledge presence or absence of depression, maximize coping skills, provide positive support system. Participant is able to verbalize types and ability to use techniques and skills needed for reducing stress and depression.  Initial Review & Psychosocial Screening:     Initial Psych Review & Screening - 06/25/16 1452      Initial Review   Current issues with Current Depression;Current Psychotropic Meds     Family Dynamics   Good Support System? Yes  2 daughters.  Husband died in October 05, 2013     Barriers   Psychosocial barriers to participate in program There are no identifiable barriers or psychosocial needs.;The patient should benefit from training in stress management and relaxation.     Screening Interventions   Interventions Encouraged to exercise      Quality of Life Scores:     Quality of Life - 06/25/16 1457      Quality of Life Scores   Health/Function Pre 24.32 %   Socioeconomic Pre 29.58 %   Psych/Spiritual Pre 30 %   Family Pre 28.5 %   GLOBAL Pre 27.16 %      PHQ-9: Recent Review Flowsheet Data    Depression screen [facility] 2/9 06/25/2016   Decreased Interest 1   Down, Depressed, Hopeless 0   PHQ - 2 Score 1   Altered sleeping 1   Tired, decreased energy 2   Change in appetite 3   Feeling bad or failure about yourself  0   Trouble concentrating 0   Moving slowly or fidgety/restless 0   PHQ-9 Score 7   Difficult doing work/chores Somewhat difficult      Psychosocial Evaluation and Intervention:   Psychosocial Re-Evaluation:     Psychosocial Re-Evaluation    Row Name 08/29/16 1244             Psychosocial Re-Evaluation   Comments Kyandra Mcclaine called and said she is sorry that she has been out but went to the Emerg Dept twice with cellulitis of her left foot. She said she has hives from the antibiotic. Layloni said she hopes to be back in Cardiac Rehab.           Vocational Rehabilitation: Provide vocational rehab assistance to qualifying  candidates.   Vocational Rehab Evaluation & Intervention:     Vocational Rehab - 06/25/16 1502      Initial Vocational Rehab Evaluation & Intervention   Assessment shows need for  Vocational Rehabilitation No      Education: Education Goals: Education classes will be provided on a weekly basis, covering required topics. Participant will state understanding/return demonstration of topics presented.  Learning Barriers/Preferences:     Learning Barriers/Preferences - 06/25/16 1501      Learning Barriers/Preferences   Learning Barriers None   Learning Preferences Verbal Instruction;Written Material      Education Topics: General Nutrition Guidelines/Fats and Fiber: -Group instruction provided by verbal, written material, models and posters to present the general guidelines for heart healthy nutrition. Gives an explanation and review of dietary fats and fiber.   Controlling Sodium/Reading Food Labels: -Group verbal and written material supporting the discussion of sodium use in heart healthy nutrition. Review and explanation with models, verbal and written materials for utilization of the food label.   Exercise Physiology & Risk Factors: - Group verbal and written instruction with models to review the exercise physiology of the cardiovascular system and associated critical values. Details cardiovascular disease risk factors and the goals associated with each risk factor.   Aerobic Exercise & Resistance Training: - Gives group verbal and written discussion on the health impact of inactivity. On the components of aerobic and resistive training programs and the benefits of this training and how to safely progress through these programs.   Flexibility, Balance, General Exercise Guidelines: - Provides group verbal and written instruction on the benefits of flexibility and balance training programs. Provides general exercise guidelines with specific guidelines to those with heart or  lung disease. Demonstration and skill practice provided.   Stress Management: - Provides group verbal and written instruction about the health risks of elevated stress, cause of high stress, and healthy ways to reduce stress.   Depression: - Provides group verbal and written instruction on the correlation between heart/lung disease and depressed mood, treatment options, and the stigmas associated with seeking treatment.   Anatomy & Physiology of the Heart: - Group verbal and written instruction and models provide basic cardiac anatomy and physiology, with the coronary electrical and arterial systems. Review of: AMI, Angina, Valve disease, Heart Failure, Cardiac Arrhythmia, Pacemakers, and the ICD. Flowsheet Row Cardiac Rehab from 08/13/2016 in [facility] Cardiac and Pulmonary Rehab  Date  08/13/16  Educator  CE  Instruction Review Code  2- meets goals/outcomes      Cardiac Procedures: - Group verbal and written instruction and models to describe the testing methods done to diagnose heart disease. Reviews the outcomes of the test results. Describes the treatment choices: Medical Management, Angioplasty, or Coronary Bypass Surgery.   Cardiac Medications: - Group verbal and written instruction to review commonly prescribed medications for heart disease. Reviews the medication, class of the drug, and side effects. Includes the steps to properly store meds and maintain the prescription regimen.   Go Sex-Intimacy & Heart Disease, Get SMART - Goal Setting: - Group verbal and written instruction through game format to discuss heart disease and the return to sexual intimacy. Provides group verbal and written material to discuss and apply goal setting through the application of the S.M.A.R.T. Method.   Other Matters of the Heart: - Provides group verbal, written materials and models to describe Heart Failure, Angina, Valve Disease, and Diabetes in the realm of heart disease. Includes description of  the disease process and treatment options available to the cardiac patient. Flowsheet Row Cardiac Rehab from 08/13/2016 in [facility] Cardiac and Pulmonary Rehab  Date  08/13/16  Educator  CE  Instruction Review Code  2- meets goals/outcomes  Exercise & Equipment Safety: - Individual verbal instruction and demonstration of equipment use and safety with use of the equipment. Flowsheet Row Cardiac Rehab from 08/13/2016 in [facility] Cardiac and Pulmonary Rehab  Date  06/25/16  Educator  SB  Instruction Review Code  2- meets goals/outcomes      Infection Prevention: - Provides verbal and written material to individual with discussion of infection control including proper hand washing and proper equipment cleaning during exercise session. Flowsheet Row Cardiac Rehab from 08/13/2016 in [facility] Cardiac and Pulmonary Rehab  Date  06/25/16  Educator  SB  Instruction Review Code  2- meets goals/outcomes      Falls Prevention: - Provides verbal and written material to individual with discussion of falls prevention and safety.   Diabetes: - Individual verbal and written instruction to review signs/symptoms of diabetes, desired ranges of glucose level fasting, after meals and with exercise. Advice that pre and post exercise glucose checks will be done for 3 sessions at entry of program.    Knowledge Questionnaire Score:     Knowledge Questionnaire Score - 06/25/16 1501      Knowledge Questionnaire Score   Pre Score 21/27      Core Components/Risk Factors/Patient Goals at Admission:     Personal Goals and Risk Factors at Admission - 06/25/16 1451      Core Components/Risk Factors/Patient Goals on Admission    Weight Management Obesity;Yes   Intervention Weight Management: Develop a combined nutrition and exercise program designed to reach desired caloric intake, while maintaining appropriate intake of nutrient and fiber, sodium and fats, and appropriate energy expenditure required for the  weight goal.;Weight Management: Provide education and appropriate resources to help participant work on and attain dietary goals.;Weight Management/Obesity: Establish reasonable short term and long term weight goals.   Expected Outcomes Short Term: Continue to assess and modify interventions until short term weight is achieved;Long Term: Adherence to nutrition and physical activity/exercise program aimed toward attainment of established weight goal;Weight Loss: Understanding of general recommendations for a balanced deficit meal plan, which promotes 1-2 lb weight loss per week and includes a negative energy balance of [phone] kcal/d   Sedentary Yes   Intervention Provide advice, education, support and counseling about physical activity/exercise needs.;Develop an individualized exercise prescription for aerobic and resistive training based on initial evaluation findings, risk stratification, comorbidities and participant's personal goals.   Expected Outcomes Achievement of increased cardiorespiratory fitness and enhanced flexibility, muscular endurance and strength shown through measurements of functional capacity and personal statement of participant.   Increase Strength and Stamina Yes   Intervention Provide advice, education, support and counseling about physical activity/exercise needs.;Develop an individualized exercise prescription for aerobic and resistive training based on initial evaluation findings, risk stratification, comorbidities and participant's personal goals.   Expected Outcomes Achievement of increased cardiorespiratory fitness and enhanced flexibility, muscular endurance and strength shown through measurements of functional capacity and personal statement of participant.   Hypertension Yes   Intervention Provide education on lifestyle modifcations including regular physical activity/exercise, weight management, moderate sodium restriction and increased consumption of fresh fruit,  vegetables, and low fat dairy, alcohol moderation, and smoking cessation.;Monitor prescription use compliance.   Expected Outcomes Short Term: Continued assessment and intervention until BP is < 140/47m HG in hypertensive participants. < 130/842mHG in hypertensive participants with diabetes, heart failure or chronic kidney disease.;Long Term: Maintenance of blood pressure at goal levels.   Lipids Yes   Intervention Provide education and support for participant on nutrition & aerobic/resistive  exercise along with prescribed medications to achieve LDL <81m, HDL >471m   Expected Outcomes Short Term: Participant states understanding of desired cholesterol values and is compliant with medications prescribed. Participant is following exercise prescription and nutrition guidelines.;Long Term: Cholesterol controlled with medications as prescribed, with individualized exercise RX and with personalized nutrition plan. Value goals: LDL < 7063mHDL > 40 mg.      Core Components/Risk Factors/Patient Goals Review:    Core Components/Risk Factors/Patient Goals at Discharge (Final Review):    ITP Comments:     ITP Comments    Row Name 06/25/16 1443 07/11/16 0818 08/08/16 0832 08/16/16 1505 08/29/16 1244   ITP Comments Medical review completed today with ITP created.  Continue with ITP. [place] of  [place] 30 day review. Continue with ITP 30 day review. Continue with ITP unless changes noted by Medical Director at signature of review. Has attended medical review 7/3, no visits since.  SylNyia progressing well with exercise. SylJoi Leyvalled and said she is sorry that she has been out but went to the Emerg Dept twice with cellulitis of her left foot. She said she has hives from the antibiotic. SylHaaniid she hopes to be back in Cardiac Rehab.   Row Name 08/30/16 1123 09/05/16 0705         ITP Comments SylLea currently not attending due to cellulitis.  She plans to  return when cleared for exercise. 30 day review. Continue with ITP unless changes noted by Medical Director at signature of review.         Comments:

## 2016-09-10 ENCOUNTER — Encounter: Payer: Medicare Other | Attending: Cardiovascular Disease

## 2016-09-11 ENCOUNTER — Encounter: Payer: Self-pay | Admitting: Podiatry

## 2016-09-12 ENCOUNTER — Ambulatory Visit (INDEPENDENT_AMBULATORY_CARE_PROVIDER_SITE_OTHER): Payer: Medicare Other | Admitting: Podiatry

## 2016-09-12 DIAGNOSIS — M79676 Pain in unspecified toe(s): Secondary | ICD-10-CM | POA: Diagnosis not present

## 2016-09-12 DIAGNOSIS — B351 Tinea unguium: Secondary | ICD-10-CM | POA: Diagnosis not present

## 2016-09-12 NOTE — Progress Notes (Signed)
She presents today with chief complaint of painful elongated toenails 1 through 5 bilateral.  Objective: Her toenails are thick yellow dystrophic onychomycotic and sharply incurvated. Pulses are palpable no open lesions or wounds are noted.  Assessment: Pain in limb secondary onychomycosis.  Plan: Debridement of toenails 1 through 5 bilateral. Follow up with me as needed.

## 2016-09-19 ENCOUNTER — Telehealth: Payer: Self-pay | Admitting: *Deleted

## 2016-09-19 NOTE — Telephone Encounter (Signed)
Called pt and go there voicemail and left message that the BCI test would be covered 100 % however with her having more than 3 positive nodes on her original pathology the test is not reliable when more than 3 nodes positive so the test will not be done.  also if she wants to stay on the breast cancer medicine for 10 years it is ok we just do not have test results to prove it.  Also at her last visit Dr. Mike Gip and pt decided about lymphedema ref. But pt wanted to hold off for a while. I wanted to check to see if she is ready now for it.  I asked her to call me at [facility] location today and left the numbers to reach me

## 2016-09-26 ENCOUNTER — Telehealth: Payer: Self-pay

## 2016-09-26 NOTE — Telephone Encounter (Signed)
LMOM

## 2016-09-28 ENCOUNTER — Other Ambulatory Visit: Payer: Self-pay | Admitting: *Deleted

## 2016-09-28 ENCOUNTER — Encounter: Payer: Self-pay | Admitting: *Deleted

## 2016-09-28 DIAGNOSIS — I213 ST elevation (STEMI) myocardial infarction of unspecified site: Secondary | ICD-10-CM

## 2016-09-28 DIAGNOSIS — Z955 Presence of coronary angioplasty implant and graft: Secondary | ICD-10-CM

## 2016-09-28 NOTE — Progress Notes (Signed)
Discharge Summary  Patient Details  Name: Hailey Ellison MRN: [id number] Date of Birth: 1935-05-13 Referring Provider:   Flowsheet Row Cardiac Rehab from 06/25/2016 in [facility]  Referring Provider  [place]       Number of Visits: 4  Reason for Discharge:  Early Exit:  Personal Ongoing health issues including cellulitis  Smoking History:  History  Smoking Status  . Former Smoker  Smokeless Tobacco  . Never Used    Diagnosis:  ST elevation myocardial infarction (STEMI), unspecified artery ([place])  Status post coronary artery stent placement  ADL [facility]:   Initial Exercise Prescription:     Initial Exercise Prescription - 06/25/16 1400      Date of Initial Exercise RX and Referring Provider   Date 06/25/16   Referring Provider Arida     Recumbant Bike   Level 1   RPM 50   Watts 20   Minutes 15     NuStep   Level 1   Watts 25   Minutes 15     T5 Nustep   Level 1   Watts 25   Minutes 15     Biostep-RELP   Level 1   Watts 25   Minutes 15     Prescription Details   Frequency (times per week) 3   Duration Progress to 30 minutes of continuous aerobic without signs/symptoms of physical distress     Intensity   THRR 40-80% of Max Heartrate 105-128   Ratings of Perceived Exertion 11-13   Perceived Dyspnea 0-4     Progression   Progression Continue to progress workloads to maintain intensity without signs/symptoms of physical distress.     Resistance Training   Training Prescription Yes   Weight 1   Reps 10-15      Discharge Exercise Prescription (Final Exercise Prescription Changes):     Exercise Prescription Changes - 08/16/16 1500      Exercise Review   Progression Yes     Response to Exercise   Blood Pressure (Admit) 138/62   Blood Pressure (Exercise) 120/68   Blood Pressure (Exit) 144/62   Heart Rate (Admit) 89 bpm   Heart Rate (Exercise) 89 bpm   Heart Rate (Exit) 75 bpm   Rating of Perceived Exertion (Exercise)  12     Progression   Average METs 1.85     Resistance Training   Training Prescription Yes   Weight 0  due to lymphedema   Reps 10-15     Interval Training   Interval Training No     NuStep   Level 1   Minutes 15     T5 Nustep   Level 1   Minutes 15      Functional Capacity:     6 Minute Walk    Row Name 06/25/16 1424         6 Minute Walk   Phase Initial     Distance 900 feet     Walk Time 6 minutes     MPH 1.7     METS 1.5     RPE 13     VO2 Peak 5.3     Symptoms No     Resting HR 81 bpm     Resting BP 110/60     Max Ex. HR 110 bpm     Max Ex. BP 118/56        Psychological, QOL, Others - Outcomes: PHQ 2/9: Depression screen [facility] 2/9 06/25/2016  Decreased  Interest 1  Down, Depressed, Hopeless 0  PHQ - 2 Score 1  Altered sleeping 1  Tired, decreased energy 2  Change in appetite 3  Feeling bad or failure about yourself  0  Trouble concentrating 0  Moving slowly or fidgety/restless 0  PHQ-9 Score 7  Difficult doing work/chores Somewhat difficult    Quality of Life:     Quality of Life - 06/25/16 1457      Quality of Life Scores   Health/Function Pre 24.32 %   Socioeconomic Pre 29.58 %   Psych/Spiritual Pre 30 %   Family Pre 28.5 %   GLOBAL Pre 27.16 %      Personal Goals: Goals established at orientation with interventions provided to work toward goal.     Personal Goals and Risk Factors at Admission - 06/25/16 1451      Core Components/Risk Factors/Patient Goals on Admission    Weight Management Obesity;Yes   Intervention Weight Management: Develop a combined nutrition and exercise program designed to reach desired caloric intake, while maintaining appropriate intake of nutrient and fiber, sodium and fats, and appropriate energy expenditure required for the weight goal.;Weight Management: Provide education and appropriate resources to help participant work on and attain dietary goals.;Weight Management/Obesity: Establish reasonable  short term and long term weight goals.   Expected Outcomes Short Term: Continue to assess and modify interventions until short term weight is achieved;Long Term: Adherence to nutrition and physical activity/exercise program aimed toward attainment of established weight goal;Weight Loss: Understanding of general recommendations for a balanced deficit meal plan, which promotes 1-2 lb weight loss per week and includes a negative energy balance of [phone] kcal/d   Sedentary Yes   Intervention Provide advice, education, support and counseling about physical activity/exercise needs.;Develop an individualized exercise prescription for aerobic and resistive training based on initial evaluation findings, risk stratification, comorbidities and participant's personal goals.   Expected Outcomes Achievement of increased cardiorespiratory fitness and enhanced flexibility, muscular endurance and strength shown through measurements of functional capacity and personal statement of participant.   Increase Strength and Stamina Yes   Intervention Provide advice, education, support and counseling about physical activity/exercise needs.;Develop an individualized exercise prescription for aerobic and resistive training based on initial evaluation findings, risk stratification, comorbidities and participant's personal goals.   Expected Outcomes Achievement of increased cardiorespiratory fitness and enhanced flexibility, muscular endurance and strength shown through measurements of functional capacity and personal statement of participant.   Hypertension Yes   Intervention Provide education on lifestyle modifcations including regular physical activity/exercise, weight management, moderate sodium restriction and increased consumption of fresh fruit, vegetables, and low fat dairy, alcohol moderation, and smoking cessation.;Monitor prescription use compliance.   Expected Outcomes Short Term: Continued assessment and intervention  until BP is < 140/43mm HG in hypertensive participants. < 130/55mm HG in hypertensive participants with diabetes, heart failure or chronic kidney disease.;Long Term: Maintenance of blood pressure at goal levels.   Lipids Yes   Intervention Provide education and support for participant on nutrition & aerobic/resistive exercise along with prescribed medications to achieve LDL 70mg , HDL >40mg .   Expected Outcomes Short Term: Participant states understanding of desired cholesterol values and is compliant with medications prescribed. Participant is following exercise prescription and nutrition guidelines.;Long Term: Cholesterol controlled with medications as prescribed, with individualized exercise RX and with personalized nutrition plan. Value goals: LDL < 70mg , HDL > 40 mg.       Personal Goals Discharge:   Nutrition & Weight - Outcomes:  Pre Biometrics - 06/25/16 1423      Pre Biometrics   Height 5' 4.5" (1.638 m)   Weight 184 lb 6.4 oz (83.6 kg)   Waist Circumference 42.5 inches   Hip Circumference 47.25 inches   Waist to Hip Ratio 0.9 %   BMI (Calculated) 31.2   Single Leg Stand 1.32 seconds       Nutrition:     Nutrition Therapy & Goals - 08/23/16 1810      Nutrition Therapy   Diet 1300kcal renal diet   Drug/Food Interactions Statins/Certain Fruits   Protein (specify units) 5-6oz   Fiber 20 grams   Whole Grain Foods 0 servings   Saturated Fats 11 max. grams   Fruits and Vegetables 8 servings/day   Sodium 1500 grams     Personal Nutrition Goals   Personal Goal #1 Try adding a boiled egg with your oatmeal at breakfast   Personal Goal #2 Keep beans, cheese, peanut butter to one serving daily (to keep phosphorus intake low)   Personal Goal #3 Try lightly-flavored water such as [employer]. Avoid [employer] water (sodium, potassium)   Comments Patient has been overly restricting protein in effort to protect her kidneys. Advised 5-6oz of heart healthy proteins daily,  discussed options. She is limiting most foods with saturated fat.      Intervention Plan   Intervention Prescribe, educate and counsel regarding individualized specific dietary modifications aiming towards targeted core components such as weight, hypertension, lipid management, diabetes, heart failure and other comorbidities.;Nutrition handout(s) given to patient.   Expected Outcomes Short Term Goal: Understand basic principles of dietary content, such as calories, fat, sodium, cholesterol and nutrients.;Short Term Goal: A plan has been developed with personal nutrition goals set during dietitian appointment.;Long Term Goal: Adherence to prescribed nutrition plan.      Nutrition Discharge:     Nutrition Assessments - 08/23/16 1815      Rate Your Plate Scores   Pre Score 58   Pre Score % 64 %      Education Questionnaire Score:     Knowledge Questionnaire Score - 06/25/16 1501      Knowledge Questionnaire Score   Pre Score 21/27      Goals reviewed with patient; copy given to patient.

## 2016-09-28 NOTE — Progress Notes (Signed)
Cardiac Individual Treatment Plan  Patient Details  Name: Hailey Ellison MRN: [id number] Date of Birth: [birth year] Referring Provider:   Flowsheet Row Cardiac Rehab from 06/25/2016 in [facility] Cardiac and Pulmonary Rehab  Referring Provider  Arida      Initial Encounter Date:  Flowsheet Row Cardiac Rehab from 06/25/2016 in [facility] Cardiac and Pulmonary Rehab  Date  06/25/16  Referring Provider  Fletcher Anon      Visit Diagnosis: ST elevation myocardial infarction (STEMI), unspecified artery [facility])  Status post coronary artery stent placement  Patient's Home Medications on Admission:  Current Outpatient Prescriptions:  .  anastrozole (ARIMIDEX) 1 MG tablet, Take 1 mg by mouth daily., Disp: , Rfl:  .  aspirin 81 MG chewable tablet, Chew 1 tablet (81 mg total) by mouth daily., Disp: 30 tablet, Rfl: 2 .  atorvastatin (LIPITOR) 40 MG tablet, Take 1 tablet (40 mg total) by mouth at bedtime., Disp: 30 tablet, Rfl: 2 .  calcium citrate-vitamin D (CITRACAL+D) 315-200 MG-UNIT per tablet, Take 1 tablet by mouth 2 (two) times daily., Disp: , Rfl:  .  furosemide (LASIX) 20 MG tablet, Take 20 mg by mouth daily as needed for fluid. , Disp: , Rfl:  .  glucosamine-chondroitin 500-400 MG tablet, Take 2 tablets by mouth daily., Disp: , Rfl:  .  lansoprazole (PREVACID) 30 MG capsule, Take 30 mg by mouth daily., Disp: , Rfl:  .  Multiple Vitamin (MULTIVITAMIN WITH MINERALS) TABS, Take 1 tablet by mouth daily., Disp: , Rfl:  .  PARoxetine (PAXIL) 40 MG tablet, Take 40 mg by mouth daily., Disp: , Rfl:  .  predniSONE (DELTASONE) 10 MG tablet, Take 1 tablet (10 mg total) by mouth as directed. Take on daily regimen of 6, 5, 4, 3, 2, 1, Disp: 21 tablet, Rfl: 0 .  ticagrelor (BRILINTA) 90 MG TABS tablet, Take 1 tablet (90 mg total) by mouth 2 (two) times daily., Disp: 60 tablet, Rfl: 0 .  triamcinolone cream (KENALOG) 0.1 %, Apply 1 application topically 4 (four) times daily., Disp: 30 g, Rfl: 0 .  valsartan (DIOVAN) 320 MG  tablet, Take 320 mg by mouth daily., Disp: , Rfl:  .  verapamil (CALAN) 120 MG tablet, Take 60 mg by mouth daily., Disp: , Rfl:   Past Medical History: Past Medical History:  Diagnosis Date  . Arthritis   . Breast cancer ([place])    Adjuvant chemotherapy with Taxotere/Carboplatin/Herceptin. Arimidex initiated November 2011  . Cancer ([place]) 2010   T1c,N2a, M0; ER 90%, PR 60%,  HER-2/neu 2+ IHC, equivocal on fish. 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  Margins clear.  . Chronic kidney disease 2014  . Heart attack 06/09/2016  . Hypertension   . Lymphedema   . Murmur   . Osteoporosis   . Radiation 2010   with chemo  . Squamous cell carcinoma June 2016   Left upper inner chest wall, area of previous radiation.    Tobacco Use: History  Smoking Status  . Former Smoker  Smokeless Tobacco  . Never Used    Labs: Recent [employer] for ITP Cardiac and Pulmonary Rehab Latest Ref Rng & Units 06/08/2016   Cholestrol 0 - 200 mg/dL 135   LDLCALC 0 - 99 mg/dL 81   HDL >40 mg/dL 47   Trlycerides <150 mg/dL 37       Exercise Target Goals:  Exercise Program Goal: Individual exercise prescription set with THRR, safety & activity barriers. Participant demonstrates ability to understand and report RPE using BORG scale, to self-measure pulse accurately, and to acknowledge the importance of the exercise prescription.  Exercise Prescription Goal: Starting with aerobic activity 30 plus minutes a day, 3 days per week for initial exercise prescription. Provide home exercise prescription and guidelines that participant acknowledges understanding prior to discharge.  Activity Barriers & Risk Stratification:     Activity Barriers & Cardiac Risk Stratification - 06/25/16 1458      Activity Barriers & Cardiac Risk Stratification   Activity Barriers Joint  Problems;Back Problems;Shortness of Breath;Deconditioning  Was to have left knee replacement and had heart attack week before scheduled surgery.  BAck problems are old and no concerns at present. Will let staff know if exercise flares back . SOB with exertion. Questions if may be deconditioning. Has osteoporosis   Cardiac Risk Stratification High      6 Minute Walk:     6 Minute Walk    Row Name 06/25/16 1424         6 Minute Walk   Phase Initial     Distance 900 feet     Walk Time 6 minutes     MPH 1.7     METS 1.5     RPE 13     VO2 Peak 5.3     Symptoms No     Resting HR 81 bpm     Resting BP 110/60     Max Ex. HR 110 bpm     Max Ex. BP 118/56        Initial Exercise Prescription:     Initial Exercise Prescription - 06/25/16 1400      Date of Initial Exercise RX and Referring Provider   Date 06/25/16   Referring Provider Arida     Recumbant Bike   Level 1   RPM 50   Watts 20   Minutes 15     NuStep   Level 1   Watts 25   Minutes 15     T5 Nustep   Level 1   Watts 25   Minutes 15     Biostep-RELP   Level 1   Watts 25   Minutes 15     Prescription Details   Frequency (times per week) 3   Duration Progress to 30 minutes of continuous aerobic without signs/symptoms of physical distress     Intensity   THRR 40-80% of Max Heartrate 105-128   Ratings of Perceived Exertion 11-13   Perceived Dyspnea 0-4     Progression   Progression Continue to progress workloads to maintain intensity without signs/symptoms of physical distress.     Resistance Training   Training Prescription Yes   Weight 1   Reps 10-15      Perform Capillary Blood Glucose checks as needed.  Exercise Prescription Changes:     Exercise Prescription Changes    Row Name 06/25/16 1400 08/16/16 1500           Exercise Review   Progression  - Yes        Response to Exercise   Blood Pressure (Admit) 110/60 138/62      Blood Pressure (Exercise) 118/56 120/68      Blood  Pressure (Exit) 94/58 144/62      Heart Rate (Admit) 81 bpm 89 bpm      Heart Rate (Exercise) 110 bpm 89 bpm  Heart Rate (Exit) 73 bpm 75 bpm      Rating of Perceived Exertion (Exercise) 13 12        Progression   Average METs  - 1.85        Resistance Training   Training Prescription  - Yes      Weight  - 0  due to lymphedema      Reps  - 10-15        Interval Training   Interval Training  - No        NuStep   Level  - 1      Minutes  - 15        T5 Nustep   Level  - 1      Minutes  - 15         Exercise Comments:     Exercise Comments    Row Name 08/03/16 1030 08/16/16 1505 08/30/16 1122 09/13/16 1036     Exercise Comments Ms Pollina has not attended the program since her medical review 06/25/16. Janea is progressing well with exercise. Dniyah is currently not attending due to cellulitis.  She plans to return when cleared for exercise. Eudell has missed sessions due to cellulitis.  She plans to return when able.       Discharge Exercise Prescription (Final Exercise Prescription Changes):     Exercise Prescription Changes - 08/16/16 1500      Exercise Review   Progression Yes     Response to Exercise   Blood Pressure (Admit) 138/62   Blood Pressure (Exercise) 120/68   Blood Pressure (Exit) 144/62   Heart Rate (Admit) 89 bpm   Heart Rate (Exercise) 89 bpm   Heart Rate (Exit) 75 bpm   Rating of Perceived Exertion (Exercise) 12     Progression   Average METs 1.85     Resistance Training   Training Prescription Yes   Weight 0  due to lymphedema   Reps 10-15     Interval Training   Interval Training No     NuStep   Level 1   Minutes 15     T5 Nustep   Level 1   Minutes 15      Nutrition:  Target Goals: Understanding of nutrition guidelines, daily intake of sodium '1500mg'$ , cholesterol '200mg'$ , calories 30% from fat and 7% or less from saturated fats, daily to have 5 or more servings of fruits and vegetables.  Biometrics:     Pre Biometrics  - 06/25/16 1423      Pre Biometrics   Height 5' 4.5" (1.638 m)   Weight 184 lb 6.4 oz (83.6 kg)   Waist Circumference 42.5 inches   Hip Circumference 47.25 inches   Waist to Hip Ratio 0.9 %   BMI (Calculated) 31.2   Single Leg Stand 1.32 seconds       Nutrition Therapy Plan and Nutrition Goals:     Nutrition Therapy & Goals - 08/23/16 1810      Nutrition Therapy   Diet 1300kcal renal diet   Drug/Food Interactions Statins/Certain Fruits   Protein (specify units) 5-6oz   Fiber 20 grams   Whole Grain Foods 0 servings   Saturated Fats 11 max. grams   Fruits and Vegetables 8 servings/day   Sodium 1500 grams     Personal Nutrition Goals   Personal Goal #1 Try adding a boiled egg with your oatmeal at breakfast   Personal Goal #2 Keep beans, cheese, peanut butter to one  serving daily (to keep phosphorus intake low)   Personal Goal #3 Try lightly-flavored water such as [employer]. Avoid Propel water (sodium, potassium)   Comments Patient has been overly restricting protein in effort to protect her kidneys. Advised 5-6oz of heart healthy proteins daily, discussed options. She is limiting most foods with saturated fat.      Intervention Plan   Intervention Prescribe, educate and counsel regarding individualized specific dietary modifications aiming towards targeted core components such as weight, hypertension, lipid management, diabetes, heart failure and other comorbidities.;Nutrition handout(s) given to patient.   Expected Outcomes Short Term Goal: Understand basic principles of dietary content, such as calories, fat, sodium, cholesterol and nutrients.;Short Term Goal: A plan has been developed with personal nutrition goals set during dietitian appointment.;Long Term Goal: Adherence to prescribed nutrition plan.      Nutrition Discharge: Rate Your Plate Scores:     Nutrition Assessments - 08/23/16 1815      Rate Your Plate Scores   Pre Score 58   Pre Score % 64 %       Nutrition Goals Re-Evaluation:   Psychosocial: Target Goals: Acknowledge presence or absence of depression, maximize coping skills, provide positive support system. Participant is able to verbalize types and ability to use techniques and skills needed for reducing stress and depression.  Initial Review & Psychosocial Screening:     Initial Psych Review & Screening - 06/25/16 1452      Initial Review   Current issues with Current Depression;Current Psychotropic Meds     Family Dynamics   Good Support System? Yes  2 daughters.  Husband died in 10/16/13    Barriers   Psychosocial barriers to participate in program There are no identifiable barriers or psychosocial needs.;The patient should benefit from training in stress management and relaxation.     Screening Interventions   Interventions Encouraged to exercise      Quality of Life Scores:     Quality of Life - 06/25/16 1457      Quality of Life Scores   Health/Function Pre 24.32 %   Socioeconomic Pre 29.58 %   Psych/Spiritual Pre 30 %   Family Pre 28.5 %   GLOBAL Pre 27.16 %      PHQ-9: Recent Review Flowsheet Data    Depression screen [facility] 2/9 06/25/2016   Decreased Interest 1   Down, Depressed, Hopeless 0   PHQ - 2 Score 1   Altered sleeping 1   Tired, decreased energy 2   Change in appetite 3   Feeling bad or failure about yourself  0   Trouble concentrating 0   Moving slowly or fidgety/restless 0   PHQ-9 Score 7   Difficult doing work/chores Somewhat difficult      Psychosocial Evaluation and Intervention:   Psychosocial Re-Evaluation:     Psychosocial Re-Evaluation    Row Name 08/29/16 1244             Psychosocial Re-Evaluation   Comments Rajvi Armentor called and said she is sorry that she has been out but went to the Emerg Dept twice with cellulitis of her left foot. She said she has hives from the antibiotic. Deicy said she hopes to be back in Cardiac Rehab.           Vocational  Rehabilitation: Provide vocational rehab assistance to qualifying candidates.   Vocational Rehab Evaluation & Intervention:     Vocational Rehab - 06/25/16 1502      Initial Vocational Rehab  Evaluation & Intervention   Assessment shows need for Vocational Rehabilitation No      Education: Education Goals: Education classes will be provided on a weekly basis, covering required topics. Participant will state understanding/return demonstration of topics presented.  Learning Barriers/Preferences:     Learning Barriers/Preferences - 06/25/16 1501      Learning Barriers/Preferences   Learning Barriers None   Learning Preferences Verbal Instruction;Written Material      Education Topics: General Nutrition Guidelines/Fats and Fiber: -Group instruction provided by verbal, written material, models and posters to present the general guidelines for heart healthy nutrition. Gives an explanation and review of dietary fats and fiber.   Controlling Sodium/Reading Food Labels: -Group verbal and written material supporting the discussion of sodium use in heart healthy nutrition. Review and explanation with models, verbal and written materials for utilization of the food label.   Exercise Physiology & Risk Factors: - Group verbal and written instruction with models to review the exercise physiology of the cardiovascular system and associated critical values. Details cardiovascular disease risk factors and the goals associated with each risk factor.   Aerobic Exercise & Resistance Training: - Gives group verbal and written discussion on the health impact of inactivity. On the components of aerobic and resistive training programs and the benefits of this training and how to safely progress through these programs.   Flexibility, Balance, General Exercise Guidelines: - Provides group verbal and written instruction on the benefits of flexibility and balance training programs. Provides general  exercise guidelines with specific guidelines to those with heart or lung disease. Demonstration and skill practice provided.   Stress Management: - Provides group verbal and written instruction about the health risks of elevated stress, cause of high stress, and healthy ways to reduce stress.   Depression: - Provides group verbal and written instruction on the correlation between heart/lung disease and depressed mood, treatment options, and the stigmas associated with seeking treatment.   Anatomy & Physiology of the Heart: - Group verbal and written instruction and models provide basic cardiac anatomy and physiology, with the coronary electrical and arterial systems. Review of: AMI, Angina, Valve disease, Heart Failure, Cardiac Arrhythmia, Pacemakers, and the ICD. Flowsheet Row Cardiac Rehab from 08/13/2016 in [facility] Cardiac and Pulmonary Rehab  Date  08/13/16  Educator  CE  Instruction Review Code  2- meets goals/outcomes      Cardiac Procedures: - Group verbal and written instruction and models to describe the testing methods done to diagnose heart disease. Reviews the outcomes of the test results. Describes the treatment choices: Medical Management, Angioplasty, or Coronary Bypass Surgery.   Cardiac Medications: - Group verbal and written instruction to review commonly prescribed medications for heart disease. Reviews the medication, class of the drug, and side effects. Includes the steps to properly store meds and maintain the prescription regimen.   Go Sex-Intimacy & Heart Disease, Get SMART - Goal Setting: - Group verbal and written instruction through game format to discuss heart disease and the return to sexual intimacy. Provides group verbal and written material to discuss and apply goal setting through the application of the S.M.A.R.T. Method.   Other Matters of the Heart: - Provides group verbal, written materials and models to describe Heart Failure, Angina, Valve Disease,  and Diabetes in the realm of heart disease. Includes description of the disease process and treatment options available to the cardiac patient. Flowsheet Row Cardiac Rehab from 08/13/2016 in [facility] Cardiac and Pulmonary Rehab  Date  08/13/16  Educator  CE  Instruction Review Code  2- meets goals/outcomes      Exercise & Equipment Safety: - Individual verbal instruction and demonstration of equipment use and safety with use of the equipment. Flowsheet Row Cardiac Rehab from 08/13/2016 in [facility] Cardiac and Pulmonary Rehab  Date  06/25/16  Educator  SB  Instruction Review Code  2- meets goals/outcomes      Infection Prevention: - Provides verbal and written material to individual with discussion of infection control including proper hand washing and proper equipment cleaning during exercise session. Flowsheet Row Cardiac Rehab from 08/13/2016 in [facility] Cardiac and Pulmonary Rehab  Date  06/25/16  Educator  SB  Instruction Review Code  2- meets goals/outcomes      Falls Prevention: - Provides verbal and written material to individual with discussion of falls prevention and safety.   Diabetes: - Individual verbal and written instruction to review signs/symptoms of diabetes, desired ranges of glucose level fasting, after meals and with exercise. Advice that pre and post exercise glucose checks will be done for 3 sessions at entry of program.    Knowledge Questionnaire Score:     Knowledge Questionnaire Score - 06/25/16 1501      Knowledge Questionnaire Score   Pre Score 21/27      Core Components/Risk Factors/Patient Goals at Admission:     Personal Goals and Risk Factors at Admission - 06/25/16 1451      Core Components/Risk Factors/Patient Goals on Admission    Weight Management Obesity;Yes   Intervention Weight Management: Develop a combined nutrition and exercise program designed to reach desired caloric intake, while maintaining appropriate intake of nutrient and fiber,  sodium and fats, and appropriate energy expenditure required for the weight goal.;Weight Management: Provide education and appropriate resources to help participant work on and attain dietary goals.;Weight Management/Obesity: Establish reasonable short term and long term weight goals.   Expected Outcomes Short Term: Continue to assess and modify interventions until short term weight is achieved;Long Term: Adherence to nutrition and physical activity/exercise program aimed toward attainment of established weight goal;Weight Loss: Understanding of general recommendations for a balanced deficit meal plan, which promotes 1-2 lb weight loss per week and includes a negative energy balance of [phone] kcal/d   Sedentary Yes   Intervention Provide advice, education, support and counseling about physical activity/exercise needs.;Develop an individualized exercise prescription for aerobic and resistive training based on initial evaluation findings, risk stratification, comorbidities and participant's personal goals.   Expected Outcomes Achievement of increased cardiorespiratory fitness and enhanced flexibility, muscular endurance and strength shown through measurements of functional capacity and personal statement of participant.   Increase Strength and Stamina Yes   Intervention Provide advice, education, support and counseling about physical activity/exercise needs.;Develop an individualized exercise prescription for aerobic and resistive training based on initial evaluation findings, risk stratification, comorbidities and participant's personal goals.   Expected Outcomes Achievement of increased cardiorespiratory fitness and enhanced flexibility, muscular endurance and strength shown through measurements of functional capacity and personal statement of participant.   Hypertension Yes   Intervention Provide education on lifestyle modifcations including regular physical activity/exercise, weight management, moderate  sodium restriction and increased consumption of fresh fruit, vegetables, and low fat dairy, alcohol moderation, and smoking cessation.;Monitor prescription use compliance.   Expected Outcomes Short Term: Continued assessment and intervention until BP is < 140/68m HG in hypertensive participants. < 130/880mHG in hypertensive participants with diabetes, heart failure or chronic kidney disease.;Long Term: Maintenance of blood pressure at goal levels.   Lipids Yes  Intervention Provide education and support for participant on nutrition & aerobic/resistive exercise along with prescribed medications to achieve LDL '70mg'$ , HDL >'40mg'$ .   Expected Outcomes Short Term: Participant states understanding of desired cholesterol values and is compliant with medications prescribed. Participant is following exercise prescription and nutrition guidelines.;Long Term: Cholesterol controlled with medications as prescribed, with individualized exercise RX and with personalized nutrition plan. Value goals: LDL < '70mg'$ , HDL > 40 mg.      Core Components/Risk Factors/Patient Goals Review:    Core Components/Risk Factors/Patient Goals at Discharge (Final Review):    ITP Comments:     ITP Comments    Row Name 06/25/16 1443 07/11/16 0818 08/08/16 0832 08/16/16 1505 08/29/16 1244   ITP Comments Medical review completed today with ITP created.  Continue with ITP. Documentation of  diagnosis found [place] [facility] 06/10/2016 30 day review. Continue with ITP 30 day review. Continue with ITP unless changes noted by Medical Director at signature of review. Has attended medical review 7/3, no visits since.  Barb is progressing well with exercise. Takyla Kuchera called and said she is sorry that she has been out but went to the Emerg Dept twice with cellulitis of her left foot. She said she has hives from the antibiotic. Fotini said she hopes to be back in Cardiac Rehab.   Row Name 08/30/16 1123 09/05/16 0705 09/13/16 1036 09/28/16 1256      ITP Comments Analyse is currently not attending due to cellulitis.  She plans to return when cleared for exercise. 30 day review. Continue with ITP unless changes noted by Medical Director at signature of review. Brisa has missed sessions due to cellulitis.  She plans to return when able. Delano returned our call about status. She continues to have had on going health issues and would like to drop at this time.  She hopes to return to finish the program as she feels better (hopefully within the next month or so).       Comments: Discharge ITP

## 2016-10-24 ENCOUNTER — Other Ambulatory Visit: Payer: Self-pay | Admitting: *Deleted

## 2016-10-24 MED ORDER — ANASTROZOLE 1 MG PO TABS: 1.0000 mg | ORAL_TABLET | Freq: Every day | ORAL | 1 refills | Status: DC

## 2016-10-25 ENCOUNTER — Other Ambulatory Visit: Payer: Self-pay | Admitting: *Deleted

## 2016-10-25 MED ORDER — ANASTROZOLE 1 MG PO TABS: 1.0000 mg | ORAL_TABLET | Freq: Every day | ORAL | 0 refills | Status: DC

## 2016-10-25 MED ORDER — ANASTROZOLE 1 MG PO TABS: 1.0000 mg | ORAL_TABLET | Freq: Every day | ORAL | 1 refills | Status: DC

## 2016-10-31 ENCOUNTER — Encounter: Payer: Self-pay | Admitting: *Deleted

## 2016-10-31 DIAGNOSIS — I213 ST elevation (STEMI) myocardial infarction of unspecified site: Secondary | ICD-10-CM

## 2016-10-31 DIAGNOSIS — Z955 Presence of coronary angioplasty implant and graft: Secondary | ICD-10-CM

## 2016-10-31 NOTE — Progress Notes (Signed)
Cardiac Individual Treatment Plan  Patient Details  Name: Hailey Ellison MRN: [id number] Date of Birth: [birth year] Referring Provider:   Flowsheet Row Cardiac Rehab from 06/25/2016 in [facility] Cardiac and Pulmonary Rehab  Referring Provider  Arida      Initial Encounter Date:  Flowsheet Row Cardiac Rehab from 06/25/2016 in [facility] Cardiac and Pulmonary Rehab  Date  06/25/16  Referring Provider  Fletcher Anon      Visit Diagnosis: ST elevation myocardial infarction (STEMI), unspecified artery [facility])  Status post coronary artery stent placement  Patient's Home Medications on Admission:  Current Outpatient Prescriptions:  .  anastrozole (ARIMIDEX) 1 MG tablet, Take 1 tablet (1 mg total) by mouth daily., Disp: 90 tablet, Rfl: 1 .  aspirin 81 MG chewable tablet, Chew 1 tablet (81 mg total) by mouth daily., Disp: 30 tablet, Rfl: 2 .  atorvastatin (LIPITOR) 40 MG tablet, Take 1 tablet (40 mg total) by mouth at bedtime., Disp: 30 tablet, Rfl: 2 .  calcium citrate-vitamin D (CITRACAL+D) 315-200 MG-UNIT per tablet, Take 1 tablet by mouth 2 (two) times daily., Disp: , Rfl:  .  furosemide (LASIX) 20 MG tablet, Take 20 mg by mouth daily as needed for fluid. , Disp: , Rfl:  .  glucosamine-chondroitin 500-400 MG tablet, Take 2 tablets by mouth daily., Disp: , Rfl:  .  lansoprazole (PREVACID) 30 MG capsule, Take 30 mg by mouth daily., Disp: , Rfl:  .  Multiple Vitamin (MULTIVITAMIN WITH MINERALS) TABS, Take 1 tablet by mouth daily., Disp: , Rfl:  .  PARoxetine (PAXIL) 40 MG tablet, Take 40 mg by mouth daily., Disp: , Rfl:  .  predniSONE (DELTASONE) 10 MG tablet, Take 1 tablet (10 mg total) by mouth as directed. Take on daily regimen of 6, 5, 4, 3, 2, 1, Disp: 21 tablet, Rfl: 0 .  ticagrelor (BRILINTA) 90 MG TABS tablet, Take 1 tablet (90 mg total) by mouth 2 (two) times daily., Disp: 60 tablet, Rfl: 0 .  triamcinolone cream (KENALOG) 0.1 %, Apply 1 application topically 4 (four) times daily., Disp: 30 g, Rfl: 0 .   valsartan (DIOVAN) 320 MG tablet, Take 320 mg by mouth daily., Disp: , Rfl:  .  verapamil (CALAN) 120 MG tablet, Take 60 mg by mouth daily., Disp: , Rfl:   Past Medical History: Past Medical History:  Diagnosis Date  . Arthritis   . Breast cancer ([place])    Adjuvant chemotherapy with Taxotere/Carboplatin/Herceptin. Arimidex initiated November 2011  . Cancer ([place]) 2010   T1c,N2a, M0; ER 90%, PR 60%,  HER-2/neu 2+ IHC, equivocal on fish. 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  Margins clear.  . Chronic kidney disease 2014  . Heart attack 06/09/2016  . Hypertension   . Lymphedema   . Murmur   . Osteoporosis   . Radiation 2010   with chemo  . Squamous cell carcinoma June 2016   Left upper inner chest wall, area of previous radiation.    Tobacco Use: History  Smoking Status  . Former Smoker  Smokeless Tobacco  . Never Used    Labs: Recent [employer]chant [employer] for ITP Cardiac and Pulmonary Rehab Latest Ref Rng & Units 06/08/2016   Cholestrol 0 - 200 mg/dL 135   LDLCALC 0 - 99 mg/dL 81   HDL >40 mg/dL 47   Trlycerides <150 mg/dL 37  Exercise Target Goals:    Exercise Program Goal: Individual exercise prescription set with THRR, safety & activity barriers. Participant demonstrates ability to understand and report RPE using BORG scale, to self-measure pulse accurately, and to acknowledge the importance of the exercise prescription.  Exercise Prescription Goal: Starting with aerobic activity 30 plus minutes a day, 3 days per week for initial exercise prescription. Provide home exercise prescription and guidelines that participant acknowledges understanding prior to discharge.  Activity Barriers & Risk Stratification:   6 Minute Walk:   Initial Exercise Prescription:   Perform Capillary Blood Glucose checks as needed.  Exercise  Prescription Changes:   Exercise Comments:     Exercise Comments    Row Name 09/13/16 1036           Exercise Comments Emrey has missed sessions due to cellulitis.  She plans to return when able.          Discharge Exercise Prescription (Final Exercise Prescription Changes):   Nutrition:  Target Goals: Understanding of nutrition guidelines, daily intake of sodium '1500mg'$ , cholesterol '200mg'$ , calories 30% from fat and 7% or less from saturated fats, daily to have 5 or more servings of fruits and vegetables.  Biometrics:    Nutrition Therapy Plan and Nutrition Goals:   Nutrition Discharge: Rate Your Plate Scores:   Nutrition Goals Re-Evaluation:   Psychosocial: Target Goals: Acknowledge presence or absence of depression, maximize coping skills, provide positive support system. Participant is able to verbalize types and ability to use techniques and skills needed for reducing stress and depression.  Initial Review & Psychosocial Screening:   Quality of Life Scores:   PHQ-9: Recent Review Flowsheet Data    Depression screen [facility] 2/9 06/25/2016   Decreased Interest 1   Down, Depressed, Hopeless 0   PHQ - 2 Score 1   Altered sleeping 1   Tired, decreased energy 2   Change in appetite 3   Feeling bad or failure about yourself  0   Trouble concentrating 0   Moving slowly or fidgety/restless 0   PHQ-9 Score 7   Difficult doing work/chores Somewhat difficult      Psychosocial Evaluation and Intervention:   Psychosocial Re-Evaluation:   Vocational Rehabilitation: Provide vocational rehab assistance to qualifying candidates.   Vocational Rehab Evaluation & Intervention:   Education: Education Goals: Education classes will be provided on a weekly basis, covering required topics. Participant will state understanding/return demonstration of topics presented.  Learning Barriers/Preferences:   Education Topics: General Nutrition Guidelines/Fats and  Fiber: -Group instruction provided by verbal, written material, models and posters to present the general guidelines for heart healthy nutrition. Gives an explanation and review of dietary fats and fiber.   Controlling Sodium/Reading Food Labels: -Group verbal and written material supporting the discussion of sodium use in heart healthy nutrition. Review and explanation with models, verbal and written materials for utilization of the food label.   Exercise Physiology & Risk Factors: - Group verbal and written instruction with models to review the exercise physiology of the cardiovascular system and associated critical values. Details cardiovascular disease risk factors and the goals associated with each risk factor.   Aerobic Exercise & Resistance Training: - Gives group verbal and written discussion on the health impact of inactivity. On the components of aerobic and resistive training programs and the benefits of this training and how to safely progress through these programs.   Flexibility, Balance, General Exercise Guidelines: - Provides group verbal and written instruction on the benefits of flexibility and  balance training programs. Provides general exercise guidelines with specific guidelines to those with heart or lung disease. Demonstration and skill practice provided.   Stress Management: - Provides group verbal and written instruction about the health risks of elevated stress, cause of high stress, and healthy ways to reduce stress.   Depression: - Provides group verbal and written instruction on the correlation between heart/lung disease and depressed mood, treatment options, and the stigmas associated with seeking treatment.   Anatomy & Physiology of the Heart: - Group verbal and written instruction and models provide basic cardiac anatomy and physiology, with the coronary electrical and arterial systems. Review of: AMI, Angina, Valve disease, Heart Failure, Cardiac  Arrhythmia, Pacemakers, and the ICD. Flowsheet Row Cardiac Rehab from 08/13/2016 in [facility] Cardiac and Pulmonary Rehab  Date  08/13/16  Educator  CE  Instruction Review Code  2- meets goals/outcomes      Cardiac Procedures: - Group verbal and written instruction and models to describe the testing methods done to diagnose heart disease. Reviews the outcomes of the test results. Describes the treatment choices: Medical Management, Angioplasty, or Coronary Bypass Surgery.   Cardiac Medications: - Group verbal and written instruction to review commonly prescribed medications for heart disease. Reviews the medication, class of the drug, and side effects. Includes the steps to properly store meds and maintain the prescription regimen.   Go Sex-Intimacy & Heart Disease, Get SMART - Goal Setting: - Group verbal and written instruction through game format to discuss heart disease and the return to sexual intimacy. Provides group verbal and written material to discuss and apply goal setting through the application of the S.M.A.R.T. Method.   Other Matters of the Heart: - Provides group verbal, written materials and models to describe Heart Failure, Angina, Valve Disease, and Diabetes in the realm of heart disease. Includes description of the disease process and treatment options available to the cardiac patient. Flowsheet Row Cardiac Rehab from 08/13/2016 in [facility] Cardiac and Pulmonary Rehab  Date  08/13/16  Educator  CE  Instruction Review Code  2- meets goals/outcomes      Exercise & Equipment Safety: - Individual verbal instruction and demonstration of equipment use and safety with use of the equipment. Flowsheet Row Cardiac Rehab from 08/13/2016 in [facility] Cardiac and Pulmonary Rehab  Date  06/25/16  Educator  SB  Instruction Review Code  2- meets goals/outcomes      Infection Prevention: - Provides verbal and written material to individual with discussion of infection control including  proper hand washing and proper equipment cleaning during exercise session. Flowsheet Row Cardiac Rehab from 08/13/2016 in [facility] Cardiac and Pulmonary Rehab  Date  06/25/16  Educator  SB  Instruction Review Code  2- meets goals/outcomes      Falls Prevention: - Provides verbal and written material to individual with discussion of falls prevention and safety.   Diabetes: - Individual verbal and written instruction to review signs/symptoms of diabetes, desired ranges of glucose level fasting, after meals and with exercise. Advice that pre and post exercise glucose checks will be done for 3 sessions at entry of program.    Knowledge Questionnaire Score:   Core Components/Risk Factors/Patient Goals at Admission:   Core Components/Risk Factors/Patient Goals Review:    Core Components/Risk Factors/Patient Goals at Discharge (Final Review):    ITP Comments:     ITP Comments    Row Name 09/05/16 0705 09/13/16 1036 09/28/16 1256 10/31/16 0632     ITP Comments 30 day review. Continue with  ITP unless changes noted by Medical Director at [place] of review. Adonai has missed sessions due to cellulitis.  She plans to return when able. Pierce returned our call about status. She continues to have had on going health issues and would like to drop at this time.  She hopes to return to finish the program as she feels better (hopefully within the next month or so). Discharged       Comments:

## 2016-12-21 ENCOUNTER — Ambulatory Visit: Payer: Medicare Other | Admitting: Podiatry

## 2017-01-04 ENCOUNTER — Ambulatory Visit: Payer: Medicare Other | Admitting: Podiatry

## 2017-01-07 ENCOUNTER — Encounter: Payer: Self-pay | Admitting: *Deleted

## 2017-01-08 ENCOUNTER — Ambulatory Visit (INDEPENDENT_AMBULATORY_CARE_PROVIDER_SITE_OTHER): Payer: Medicare Other | Admitting: General Surgery

## 2017-01-08 ENCOUNTER — Encounter: Payer: Self-pay | Admitting: General Surgery

## 2017-01-08 ENCOUNTER — Inpatient Hospital Stay: Payer: Self-pay

## 2017-01-08 VITALS — BP 132/74 | HR 68 | Resp 12 | Ht 67.0 in | Wt 188.0 lb

## 2017-01-08 DIAGNOSIS — N611 Abscess of the breast and nipple: Secondary | ICD-10-CM

## 2017-01-08 NOTE — Patient Instructions (Addendum)
Return in May 2018 for her bilateral diagnotic mammogram.

## 2017-01-08 NOTE — Progress Notes (Signed)
Patient ID: Hailey Ellison, female   DOB: August 16, 1935, 82 y.o.   MRN: [id number]  Chief Complaint  Patient presents with  . Other    HPI Hailey Ellison is a 82 y.o. female here today for a left breast pain and noticed a new whole in her left breast area. She noticed this about a month ago and the area is draining.  HPI  Past Medical History:  Diagnosis Date  . Arthritis   . Breast cancer ([place])    Adjuvant chemotherapy with Taxotere/Carboplatin/Herceptin. Arimidex initiated November 2011  . Cancer ([place]) 2010   T1c,N2a, M0; ER 90%, PR 60%,  HER-2/neu 2+ IHC, equivocal on fish. 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  Margins clear.  . Chronic kidney disease 2014  . Heart attack 06/09/2016  . Hypertension   . Lymphedema   . Murmur   . Osteoporosis   . Radiation 2010   with chemo  . Squamous cell carcinoma June 2016   Left upper inner chest wall, area of previous radiation.    Past Surgical History:  Procedure Laterality Date  . BREAST BIOPSY Left 2010  . BREAST BIOPSY Right   . BREAST CYST EXCISION    . BREAST LUMPECTOMY Left 2010  . CARDIAC CATHETERIZATION N/A 06/08/2016   Procedure: Left Heart Cath and Coronary Angiography;  Surgeon: Wellington Hampshire, MD;  Location: [facility];  Service: Cardiovascular;  Laterality: N/A;  . CARDIAC CATHETERIZATION N/A 06/08/2016   Procedure: Coronary Stent Intervention;  Surgeon: Wellington Hampshire, MD;  Location: [facility];  Service: Cardiovascular;  Laterality: N/A;  . CARPAL TUNNEL RELEASE Bilateral   . CHOLECYSTECTOMY    . JOINT REPLACEMENT Left 2008  . PARTIAL HYSTERECTOMY  1980's  . PORT-A-CATH REMOVAL      Family History  Problem Relation Age of Onset  . Cancer Sister     breast  . Cancer Sister     kidney    Social History Social History  Substance Use Topics  . Smoking status:  Former Research scientist (life sciences)  . Smokeless tobacco: Never Used  . Alcohol use No    Allergies  Allergen Reactions  . Alendronate Shortness Of Breath  . Benadryl [Diphenhydramine Hcl] Hives  . Tape   . Tegaderm Ag Mesh [Silver]     tegaderm  . Neomycin-Bacitracin-Polymyxin  [Bacitracin-Neomycin-Polymyxin] Rash  . Neosporin [Neomycin-Bacitracin Zn-Polymyx] Rash  . Sulfa Antibiotics Rash    Current Outpatient Prescriptions  Medication Sig Dispense Refill  . anastrozole (ARIMIDEX) 1 MG tablet Take 1 tablet (1 mg total) by mouth daily. 90 tablet 1  . aspirin 81 MG chewable tablet Chew 1 tablet (81 mg total) by mouth daily. 30 tablet 2  . atorvastatin (LIPITOR) 40 MG tablet Take 1 tablet (40 mg total) by mouth at bedtime. 30 tablet 2  . calcium citrate-vitamin D (CITRACAL+D) 315-200 MG-UNIT per tablet Take 1 tablet by mouth 2 (two) times daily.    . clopidogrel (PLAVIX) 75 MG tablet     . furosemide (LASIX) 20 MG tablet Take 20 mg by mouth daily as needed for fluid.     Marland Kitchen glucosamine-chondroitin 500-400 MG tablet Take 2 tablets by mouth daily.    . lansoprazole (PREVACID) 30 MG capsule Take 30 mg by mouth daily.    . Multiple Vitamin (MULTIVITAMIN WITH MINERALS) TABS Take 1 tablet by mouth daily.    Marland Kitchen  PARoxetine (PAXIL) 40 MG tablet Take 40 mg by mouth daily.    Marland Kitchen triamcinolone cream (KENALOG) 0.1 % Apply 1 application topically 4 (four) times daily. 30 g 0  . valsartan (DIOVAN) 320 MG tablet Take 320 mg by mouth daily.    . verapamil (CALAN) 120 MG tablet Take 60 mg by mouth daily.     No current facility-administered medications for this visit.     Review of Systems Review of Systems  Constitutional: Negative.   Respiratory: Negative.   Cardiovascular: Negative.     Blood pressure 132/74, pulse 68, resp. rate 12, height '5\' 7"'$  (1.702 m), weight 188 lb (85.3 kg).  Physical Exam Physical Exam  Constitutional: She is oriented to person, place, and time. She appears well-developed and  well-nourished.  Eyes: No scleral icterus.  Pulmonary/Chest:    Musculoskeletal:       Arms: Lymphadenopathy:    She has no cervical adenopathy.  Neurological: She is alert and oriented to person, place, and time.  Skin: Skin is warm and dry.    Data Reviewed Review of the May 2017 [facility] mammogram shows extensive areas calcification of the site of previous wide excision and radiation "boost". The extent of calcifications on the mammograms in locations are up to 6 cm. No evidence of malignancy at that time.  Ultrasound examination of the area of scarring and the draining sinuses shows dense shadowing consistent with calcifications in the adipose tissue extending for an area of almost 1.6 cm.  BIRAD-2. Assessment    Chronic draining secondary to ischemic tissue post wide excision and radiation.    Plan    There is no clinical impression of recurrent cancer. I think the patient will be best served by deferring intervention until after her May 2018 mammograms. If no new findings are identified, at that point we can look at going to the operating room for formal excision of the area without undue bleeding from her antiplatelet therapy. Would ideally postpone this until 12 months after her stent placement which was completed in June 2017.    Return in May 2018 for her bilateral diagnotic mammogram.  This information has been scribed by Gaspar Cola CMA.   Robert Bellow 01/08/2017, 11:35 AM

## 2017-02-18 ENCOUNTER — Ambulatory Visit: Payer: Medicare Other | Admitting: Podiatry

## 2017-02-22 ENCOUNTER — Telehealth: Payer: Self-pay | Admitting: General Surgery

## 2017-02-22 NOTE — Telephone Encounter (Signed)
L/M ASKING PT WHERE SHE WOULD LIKE TO HAVE HER  MAMMOGRAM THIS YEAR @ [facility].PLEASE GIVE [facility] PATIENTS RESPONSE.

## 2017-02-26 ENCOUNTER — Other Ambulatory Visit: Payer: Self-pay

## 2017-02-26 DIAGNOSIS — C50912 Malignant neoplasm of unspecified site of left female breast: Secondary | ICD-10-CM

## 2017-03-04 ENCOUNTER — Ambulatory Visit: Payer: Medicare Other | Admitting: Podiatry

## 2017-04-01 ENCOUNTER — Ambulatory Visit (INDEPENDENT_AMBULATORY_CARE_PROVIDER_SITE_OTHER): Payer: Medicare Other | Admitting: Podiatry

## 2017-04-01 DIAGNOSIS — M79676 Pain in unspecified toe(s): Secondary | ICD-10-CM | POA: Diagnosis not present

## 2017-04-01 DIAGNOSIS — B351 Tinea unguium: Secondary | ICD-10-CM

## 2017-04-01 NOTE — Progress Notes (Signed)
Complaint:  Visit Type: Patient returns to my office for continued preventative foot care services. Complaint: Patient states" my nails have grown long and thick and become painful to walk and wear shoes" . The patient presents for preventative foot care services. No changes to ROS  Podiatric Exam: Vascular: dorsalis pedis and posterior tibial pulses are palpable bilateral. Capillary return is immediate. Temperature gradient is WNL. Skin turgor WNL  Sensorium: Normal Semmes Weinstein monofilament test. Normal tactile sensation bilaterally. Nail Exam: Pt has thick disfigured discolored nails with subungual debris noted bilateral entire nail hallux through fifth toenails Ulcer Exam: There is no evidence of ulcer or pre-ulcerative changes or infection. Orthopedic Exam: Muscle tone and strength are WNL. No limitations in general ROM. No crepitus or effusions noted. Foot type and digits show no abnormalities. Bony prominences are unremarkable. Skin: No Porokeratosis. No infection or ulcers  Diagnosis:  Onychomycosis, , Pain in right toe, pain in left toes  Treatment & Plan Procedures and Treatment: Consent by patient was obtained for treatment procedures. The patient understood the discussion of treatment and procedures well. All questions were answered thoroughly reviewed. Debridement of mycotic and hypertrophic toenails, 1 through 5 bilateral and clearing of subungual debris. No ulceration, no infection noted.  Return Visit-Office Procedure: Patient instructed to return to the office for a follow up visit 3 months   for continued evaluation and treatment.    Juda Toepfer DPM 

## 2017-05-08 ENCOUNTER — Ambulatory Visit
Admission: RE | Admit: 2017-05-08 | Discharge: 2017-05-08 | Disposition: A | Discharge status: Home / Self Care | Payer: Medicare Other | Source: Ambulatory Visit | Attending: General Surgery | Admitting: General Surgery

## 2017-05-08 DIAGNOSIS — C50912 Malignant neoplasm of unspecified site of left female breast: Secondary | ICD-10-CM | POA: Diagnosis present

## 2017-05-15 ENCOUNTER — Encounter: Payer: Self-pay | Admitting: General Surgery

## 2017-05-15 ENCOUNTER — Ambulatory Visit (INDEPENDENT_AMBULATORY_CARE_PROVIDER_SITE_OTHER): Payer: Medicare Other | Admitting: General Surgery

## 2017-05-15 VITALS — BP 130/64 | HR 68 | Resp 14 | Ht 67.0 in | Wt 185.0 lb

## 2017-05-15 DIAGNOSIS — N641 Fat necrosis of breast: Secondary | ICD-10-CM | POA: Diagnosis not present

## 2017-05-15 DIAGNOSIS — Z853 Personal history of malignant neoplasm of breast: Secondary | ICD-10-CM

## 2017-05-15 NOTE — Progress Notes (Signed)
Patient ID: Hailey Ellison, female   DOB: 10/30/1935, 82 y.o.   MRN: [id number]  Chief Complaint  Patient presents with  . Follow-up    HPI Hailey Ellison is a 82 y.o. female who presents for a breast evaluation. The most recent mammogram was done on 05/08/2017.  Patient does perform regular self breast checks and gets regular mammograms done.  Daughter, Hailey Ellison. Is present.  The patient has had drainage from the site of the squamous cell carcinoma excision on the left chest wall within the region of "boost" for management of her previously resected breast cancer in 2010 has persisted. The volume is small, but its persistence and focal dampness to the skin has been an ongoing concern.  Patient does perform regular self breast checks and gets regular mammograms done.  Daughter, Hailey Ellison. Is present.   HPI  Past Medical History:  Diagnosis Date  . Arthritis   . Breast cancer ([place]) 2010   Adjuvant chemotherapy with Taxotere/Carboplatin/Herceptin. Arimidex initiated November 2011  . Cancer ([facility]) 2010   T1c,N2a, M0; ER 90%, PR 60%,  HER-2/neu 2+ IHC, equivocal on fish. 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  Margins clear.  . Chronic kidney disease 2014  . Heart attack ([place]) 06/09/2016  . Hypertension   . Lymphedema   . Murmur   . Osteoporosis   . Radiation 2010   with chemo  . Squamous cell carcinoma June 2016   Left upper inner chest wall, area of previous radiation.    Past Surgical History:  Procedure Laterality Date  . BREAST BIOPSY Left 2010   positive  . BREAST BIOPSY Right    neg  . BREAST CYST EXCISION    . BREAST LUMPECTOMY Left 2010  . CARDIAC CATHETERIZATION N/A 06/08/2016   Procedure: Left Heart Cath and Coronary Angiography;  Surgeon: Wellington Hampshire, MD;  Location: [facility];  Service: Cardiovascular;  Laterality: N/A;  .  CARDIAC CATHETERIZATION N/A 06/08/2016   Procedure: Coronary Stent Intervention;  Surgeon: Wellington Hampshire, MD;  Location: [facility];  Service: Cardiovascular;  Laterality: N/A;  . CARPAL TUNNEL RELEASE Bilateral   . CHOLECYSTECTOMY    . JOINT REPLACEMENT Left 2008  . PARTIAL HYSTERECTOMY  1980's  . PORT-A-CATH REMOVAL      Family History  Problem Relation Age of Onset  . Cancer Sister        breast  . Cancer Sister        kidney    Social History Social History  Substance Use Topics  . Smoking status: Former Research scientist (life sciences)  . Smokeless tobacco: Never Used  . Alcohol use No    Allergies  Allergen Reactions  . Alendronate Shortness Of Breath  . Benadryl [Diphenhydramine Hcl] Hives  . Tape   . Tegaderm Ag Mesh [Silver]     tegaderm  . Neomycin-Bacitracin-Polymyxin  [Bacitracin-Neomycin-Polymyxin] Rash  . Neosporin [Neomycin-Bacitracin Zn-Polymyx] Rash  . Sulfa Antibiotics Rash    Current Outpatient Prescriptions  Medication Sig Dispense Refill  . anastrozole (ARIMIDEX) 1 MG tablet Take 1 tablet (1 mg total) by mouth daily. 90 tablet 1  . aspirin 81 MG chewable tablet Chew 1 tablet (81 mg total) by mouth daily. 30 tablet 2  . atorvastatin (LIPITOR) 40 MG tablet Take 1 tablet (40 mg total) by mouth at bedtime. 30 tablet 2  . calcium citrate-vitamin D (  CITRACAL+D) 315-200 MG-UNIT per tablet Take 1 tablet by mouth 2 (two) times daily.    . clopidogrel (PLAVIX) 75 MG tablet     . furosemide (LASIX) 20 MG tablet Take 20 mg by mouth daily as needed for fluid.     Marland Kitchen glucosamine-chondroitin 500-400 MG tablet Take 2 tablets by mouth daily.    . lansoprazole (PREVACID) 30 MG capsule Take 30 mg by mouth daily.    . Multiple Vitamin (MULTIVITAMIN WITH MINERALS) TABS Take 1 tablet by mouth daily.    Marland Kitchen PARoxetine (PAXIL) 40 MG tablet Take 40 mg by mouth daily.    Marland Kitchen triamcinolone cream (KENALOG) 0.1 % Apply 1 application topically 4 (four) times daily. 30 g 0  . valsartan (DIOVAN)  320 MG tablet Take 320 mg by mouth daily.    . verapamil (CALAN) 120 MG tablet Take 60 mg by mouth daily.     No current facility-administered medications for this visit.     Review of Systems Review of Systems  Constitutional: Negative.   Respiratory: Negative.   Cardiovascular: Negative.     Blood pressure 130/64, pulse 68, resp. rate 14, height 5\' 7"  (1.702 m), weight 185 lb (83.9 kg).  Physical Exam Physical Exam  Constitutional: She is oriented to person, place, and time. She appears well-developed and well-nourished.  Eyes: Conjunctivae are normal. No scleral icterus.  Neck: Neck supple.  Cardiovascular: Normal rate and regular rhythm.   Murmur heard.  Systolic murmur is present with a grade of 3/6  Pulmonary/Chest: Effort normal and breath sounds normal. Right breast exhibits no inverted nipple, no mass, no nipple discharge, no skin change and no tenderness. Left breast exhibits no inverted nipple, no mass, no nipple discharge, no skin change and no tenderness.    Musculoskeletal:       Arms: Lymphadenopathy:    She has no cervical adenopathy.    She has no axillary adenopathy.  Neurological: She is alert and oriented to person, place, and time.  Skin: Skin is warm.    Data Reviewed 05/08/2017 bilateral diagnostic mammogram and left breast ultrasound were reviewed. Postsurgical changes. BI-RADS-2. Return to screening mammograms recommended in 2019.  Original site biopsy of 05/12/2015 showed squamous cell carcinoma in situ. Repeat excision on 05/24/2015 showed residual squamous cell carcinoma.  Final intervention on 05/30/2015 showed no residual malignancy.  Last culture results of 06/19/2012 showed few staph species, coagulase negative.  Assessment    Benign breast exam.  Chronic wound status post excision 3 of squamous cell carcinoma from the left radiation field and subsequent delayed healing thought secondary to underlying fat necrosis.    Plan      Options for management were reviewed: 1 continued observation and topical wound care versus 2) sees surgical excision to healthier tissue. No guarantee of final wound healing can be given.  [place] and we'll likely defer decision on therapy until she returns. She was given 7 small Tegaderm dressings to place over the wound so she can enjoy the lake.  If she desires to proceed to excision this will be best handled as a outpatient procedure at the hospital. The patient will notify the office if she would like to proceed with excision.  The patient will contact 06/21/2012, PT regarding a new compression sleeve.   Patient will be asked to return to the office in one year with a bilateral screening mammogram.  HPI, Physical Exam, Assessment and Plan have  been scribed under the direction and in the presence of Hervey Ard, MD.  Gaspar Cola, CMA  I have completed the exam and reviewed the above documentation for accuracy and completeness.  I agree with the above.  Haematologist has been used and any errors in dictation or transcription are unintentional.  Hervey Ard, M.D., F.A.C.S.  Gaspar Cola 05/15/2017, 3:56 PM  Patient was given paperwork for Pre-admission Testing and told to hold on to it until we get a date scheduled for surgery. This patient will notify the office when she would like to proceed.   Dominga Ferry, CMA

## 2017-05-15 NOTE — Patient Instructions (Addendum)
Patient to return in July 2018 incision and drainage. Call to scheduled  Patient will be asked to return to the office in one year with a bilateral screening mammogram.

## 2017-06-24 ENCOUNTER — Ambulatory Visit: Payer: Medicare Other | Admitting: Podiatry

## 2017-07-13 ENCOUNTER — Other Ambulatory Visit: Payer: Self-pay | Admitting: General Surgery

## 2017-07-13 DIAGNOSIS — N641 Fat necrosis of breast: Secondary | ICD-10-CM

## 2017-07-15 ENCOUNTER — Telehealth: Payer: Self-pay

## 2017-07-15 NOTE — Telephone Encounter (Signed)
The patient is scheduled for surgery on 07/26/17. She will pre admit at the hospital on 07/17/17 at 2:45 pm. She will have a pre op visit here with Dr Bary Castilla on 07/18/17 at 1:30 pm. She will stop her Plavix one week prior to surgery but will remain on her Aspirin. The patient is aware of dates, times, and instructions.

## 2017-07-17 ENCOUNTER — Encounter
Admission: RE | Admit: 2017-07-17 | Discharge: 2017-07-17 | Disposition: A | Discharge status: Home / Self Care | Payer: Medicare Other | Source: Ambulatory Visit | Attending: General Surgery | Admitting: General Surgery

## 2017-07-17 DIAGNOSIS — Z01818 Encounter for other preprocedural examination: Secondary | ICD-10-CM | POA: Insufficient documentation

## 2017-07-17 HISTORY — DX: Tinnitus, unspecified ear: H93.19

## 2017-07-17 HISTORY — DX: Major depressive disorder, single episode, unspecified: F32.9

## 2017-07-17 HISTORY — DX: Gastro-esophageal reflux disease without esophagitis: K21.9

## 2017-07-17 HISTORY — DX: Anxiety disorder, unspecified: F41.9

## 2017-07-17 HISTORY — DX: Depression, unspecified: F32.A

## 2017-07-17 LAB — BASIC METABOLIC PANEL
ANION GAP: 6 (ref 5–15)
BUN: 22 mg/dL — AB (ref 6–20)
CO2: 28 mmol/L (ref 22–32)
Calcium: 9.4 mg/dL (ref 8.9–10.3)
Chloride: 108 mmol/L (ref 101–111)
Creatinine, Ser: 1.71 mg/dL — ABNORMAL HIGH (ref 0.44–1.00)
GFR calc Af Amer: 32 mL/min — ABNORMAL LOW (ref >60)
GFR, EST NON AFRICAN AMERICAN: 27 mL/min — AB (ref >60)
Glucose, Bld: 120 mg/dL — ABNORMAL HIGH (ref 65–99)
Potassium: 4.3 mmol/L (ref 3.5–5.1)
SODIUM: 142 mmol/L (ref 135–145)

## 2017-07-17 NOTE — Pre-Procedure Instructions (Signed)
Component Name Value Ref Range  Vent Rate (bpm) 82   PR Interval (msec) 156   QRS Interval (msec) 76   QT Interval (msec) 370   QTc (msec) 432   Result Narrative  Sinus rhythm with premature atrial complexes Otherwise normal ECG No previous ECGs available I reviewed and concur with this report. Electronically signed MM:[email or web address] MD, Darnell Level [phone]) on 07/17/2017 8:44:45 AM  Status Results Details    Office Visit on 07/10/2017 [place]")' href="[email or web address]/">Encounter Summary

## 2017-07-17 NOTE — Patient Instructions (Signed)
  Your procedure is scheduled on: AUGUST 3,2018 [facility])  ,Report to Same Day Surgery 2nd floor medical mall [facility] Entrance-take elevator on left to 2nd floor.  Check in with surgery information desk.) To find out your arrival time please call [phone] between 1PM - 3PM on July 25, 2017 (THURSDAY)  Remember: Instructions that are not followed completely may result in serious medical risk, up to and including death, or upon the discretion of your surgeon and anesthesiologist your surgery may need to be rescheduled.    _x___ 1. Do not eat food or drink liquids after midnight. No gum chewing or  hard  candies                                __x__ 2. No Alcohol for 24 hours before or after surgery.   __x__3. No Smoking for 24 prior to surgery.   ____  4. Bring all medications with you on the day of surgery if instructed.    __x__ 5. Notify your doctor if there is any change in your medical condition     (cold, fever, infections).     Do not wear jewelry, make-up, hairpins, clips or nail polish.  Do not wear lotions, powders, or perfumes.   Do not shave 48 hours prior to surgery. Men may shave face and neck.  Do not bring valuables to the hospital.    [facility] is not responsible for any belongings or valuables.               Contacts, dentures or bridgework may not be worn into surgery.  Leave your suitcase in the car. After surgery it may be brought to your room.  For patients admitted to the hospital, discharge time is determined by your treatment team                       Patients discharged the day of surgery will not be allowed to drive home.  You will need someone to drive you home and stay with you the night of your procedure.    Please read over the following fact sheets that you were given:   [facility] Preparing for Surgery and or MRSA Information   TAKE THE FOLLOWING MEDICATIONS THE MORNING OF SURGERY WITH A SIP OFWATER:  1. LOSARTAN  2. LANSOPRAZOLE  (LANSOPRAZOLE AT BEDTIME THE NIGHT BEFORE SURGERY, THURSDAY NIGHT ) ,    ____Fleets enema or Magnesium Citrate as directed.   _x___ Use CHG Soap or sage wipes as directed on instruction sheet   ____ Use inhalers on the day of surgery and bring to hospital day of surgery  ____ Stop Metformin and Janumet 2 days prior to surgery.    ____ Take 1/2 of usual insulin dose the night before surgery and none on the morning surgery  _x___ Follow recommendations from Cardiologist, Pulmonologist or PCP regarding          stopping Aspirin, Coumadin, Plavix ,Eliquis, Effient, or Pradaxa, and Pletal. (CHECK WITH DR Bary Castilla ABOUT STOPPING ASPIRIN AND PLAVIX )  X____Stop Anti-inflammatories such as Advil, Aleve, Ibuprofen, Motrin, Naproxen, Naprosyn, Goodies powders or aspirin products. OK to take Tylenol    _x___ Stop supplements until after surgery.  But may continue Vitamin D, Vitamin B,and multivitamin          ____ Bring C-Pap to the hospital.

## 2017-07-18 ENCOUNTER — Encounter (INDEPENDENT_AMBULATORY_CARE_PROVIDER_SITE_OTHER): Payer: Self-pay

## 2017-07-18 ENCOUNTER — Encounter: Payer: Self-pay | Admitting: General Surgery

## 2017-07-18 ENCOUNTER — Ambulatory Visit (INDEPENDENT_AMBULATORY_CARE_PROVIDER_SITE_OTHER): Payer: Medicare Other | Admitting: General Surgery

## 2017-07-18 ENCOUNTER — Encounter: Payer: Self-pay | Admitting: *Deleted

## 2017-07-18 VITALS — BP 130/74 | HR 72 | Resp 12 | Ht 67.0 in | Wt 192.0 lb

## 2017-07-18 DIAGNOSIS — N641 Fat necrosis of breast: Secondary | ICD-10-CM

## 2017-07-18 DIAGNOSIS — Z853 Personal history of malignant neoplasm of breast: Secondary | ICD-10-CM

## 2017-07-18 NOTE — Progress Notes (Signed)
Patient ID: Hailey Ellison, female   DOB: Oct 17, 1935, 82 y.o.   MRN: [id number]  Chief Complaint  Patient presents with  . Pre-op Exam    HPI Hailey Ellison is a 82 y.o. female here today for her pre op left breast wide excision scheduled on 07/26/2017.  HPI  Past Medical History:  Diagnosis Date  . Anxiety   . Arthritis   . Breast cancer ([place]) 2010   Adjuvant chemotherapy with Taxotere/Carboplatin/Herceptin. Arimidex initiated November 2011  . Cancer ([place]) 2010   T1c,N2a, M0; ER 90%, PR 60%,  HER-2/neu 2+ IHC, equivocal on fish. 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  Margins clear.  . Chronic kidney disease 2014  . Depression   . GERD (gastroesophageal reflux disease)   . Heart attack ([place]) 06/09/2016  . Hypertension   . Lymphedema   . Murmur   . Osteoporosis   . Radiation 2010   with chemo  . Squamous cell carcinoma June 2016   Left upper inner chest wall, area of previous radiation.  . Tinnitus     Past Surgical History:  Procedure Laterality Date  . ABDOMINAL HYSTERECTOMY    . BREAST BIOPSY Left 2010   positive  . BREAST BIOPSY Right    neg  . BREAST CYST EXCISION    . BREAST LUMPECTOMY Left 2010  . CARDIAC CATHETERIZATION N/A 06/08/2016   Procedure: Left Heart Cath and Coronary Angiography;  Surgeon: Wellington Hampshire, MD;  Location: [facility];  Service: Cardiovascular;  Laterality: N/A;  . CARDIAC CATHETERIZATION N/A 06/08/2016   Procedure: Coronary Stent Intervention;  Surgeon: Wellington Hampshire, MD;  Location: [facility];  Service: Cardiovascular;  Laterality: N/A;  . CARPAL TUNNEL RELEASE Bilateral   . CHOLECYSTECTOMY    . JOINT REPLACEMENT Left 2008   Total Knee Replacement  . PARTIAL HYSTERECTOMY  1980's  . PORT-A-CATH REMOVAL      Family History  Problem Relation Age of Onset  . Cancer Sister        breast  .  Cancer Sister        kidney  . Hypertension Mother   . Hypertension Father     Social History Social History  Substance Use Topics  . Smoking status: Former Smoker    Types: Cigarettes  . Smokeless tobacco: Never Used  . Alcohol use No    Allergies  Allergen Reactions  . Alendronate Shortness Of Breath  . Benadryl [Diphenhydramine Hcl] Hives  . Tegaderm Ag Mesh [Silver]     tegaderm  . Neosporin [Neomycin-Bacitracin Zn-Polymyx] Rash    Tolerates Polysporin(bacitracin zinc - polymyxin B)  . Sulfa Antibiotics Rash  . Tape Rash    Tape and bandaids    Current Outpatient Prescriptions  Medication Sig Dispense Refill  . anastrozole (ARIMIDEX) 1 MG tablet Take 1 tablet (1 mg total) by mouth daily. 90 tablet 1  . aspirin 81 MG chewable tablet Chew 1 tablet (81 mg total) by mouth daily. 30 tablet 2  . atorvastatin (LIPITOR) 80 MG tablet Take 80 mg by mouth at bedtime.    . calcium citrate-vitamin D (CITRACAL+D) 315-200 MG-UNIT per tablet Take 1 tablet by mouth 2 (two) times daily.    . clopidogrel (PLAVIX) 75 MG tablet Take 75 mg by mouth daily.     . ergocalciferol (VITAMIN D2) 50000 units capsule Take 50,000 Units by  mouth every 30 (thirty) days.     . furosemide (LASIX) 20 MG tablet Take 20 mg by mouth daily as needed for fluid.     Marland Kitchen lansoprazole (PREVACID) 30 MG capsule Take 30 mg by mouth daily.    Marland Kitchen losartan (COZAAR) 50 MG tablet Take 50 mg by mouth daily.    . Multiple Vitamin (MULTIVITAMIN WITH MINERALS) TABS Take 1 tablet by mouth daily.    Marland Kitchen nystatin cream (MYCOSTATIN) Apply 1 application topically 2 (two) times daily.    Marland Kitchen PARoxetine (PAXIL) 40 MG tablet Take 40 mg by mouth at bedtime.     . vitamin B-12 (CYANOCOBALAMIN) 500 MCG tablet Take 500 mcg by mouth daily.     No current facility-administered medications for this visit.     Review of Systems Review of Systems  Constitutional: Negative.   Respiratory: Negative.   Cardiovascular: Negative.     Blood  pressure 130/74, pulse 72, resp. rate 12, height _0  (1.702 m), weight 192 lb (87.1 kg).  Physical Exam Physical Exam  Constitutional: She is oriented to person, place, and time. She appears well-developed.  Eyes: Conjunctivae are normal. No scleral icterus.  Neck: Neck supple.  Cardiovascular: Normal rate and regular rhythm.   Murmur heard.  Systolic murmur is present with a grade of 3/6  Pulmonary/Chest: Effort normal.    Lymphadenopathy:    She has no cervical adenopathy.  Neurological: She is alert and oriented to person, place, and time.  Skin: Skin is warm and dry.  Stable wound left chest wall.   Data Reviewed Basic metabolic panel dated 82/88/3374 showed stable renal function with a creatinine of 1.71, estimated GFR 27. Normal electrolytes.  Assessment    Chest wall fat necrosis with persistent wound drainage.    Plan    We'll plan for excision. Cardiology clearance reviewed. Greater than 12 months post-stent placement. Plavix may be held for 1 week to minimize hematoma.    Patient is scheduled for left breast wide excision scheduled on 07/26/2017.Patient to stop Plavix today  and stay on her aspirin.  The patient is aware to call back for any questions or concerns.   HPI, Physical Exam, Assessment and Plan have been scribed under the direction and in the presence of Hervey Ard, MD.  Gaspar Cola, CMA  Robert Bellow 07/19/2017, 7:51 PM

## 2017-07-18 NOTE — Patient Instructions (Addendum)
  Patient is scheduled for left breast wide excision scheduled on 07/26/2017.Patient to stop Plavix today  and stay on her aspirin.  The patient is aware to call back for any questions or concerns.

## 2017-07-18 NOTE — Pre-Procedure Instructions (Signed)
MET B COMPARED WITH 08/18/16 AND STABLE. KNOWN CKD

## 2017-07-22 ENCOUNTER — Telehealth: Payer: Self-pay | Admitting: General Surgery

## 2017-07-22 NOTE — Telephone Encounter (Signed)
PATIENT SPOKE WITH CARYL-LYN THIS MORNING ABOUT TAKING A MEDICATION BEFORE SURGERY.THE PATIENT IS CALLING BACK FOR CLARIFICATION.

## 2017-07-22 NOTE — Telephone Encounter (Signed)
Spoke with patient and review her instructions from pre admit testing as far as what medications they wanted her to take. She wanted to be sure that her being off of her Plavix was in her office note from 07/18/17 and I told her it was. If she has any further questions she will call us.

## 2017-07-22 NOTE — Pre-Procedure Instructions (Signed)
ECG 12-lead7/18/2018 [place] Component Name Value Ref Range  Vent Rate (bpm) 82   PR Interval (msec) 156   QRS Interval (msec) 76   QT Interval (msec) 370   QTc (msec) 432   Result Narrative  Sinus rhythm with premature atrial complexes Otherwise normal ECG No previous ECGs available I reviewed and concur with this report. Electronically signed [email or web address] MD, Darnell Level ([id number]) on 07/17/2017 8:44:45 AM

## 2017-07-25 MED ORDER — CLINDAMYCIN PHOSPHATE 900 MG/50ML IV SOLN
900.0000 mg | Freq: Once | INTRAVENOUS | Status: AC
  Administered 2017-07-26: 900 mg via INTRAVENOUS

## 2017-07-26 ENCOUNTER — Ambulatory Visit: Payer: Medicare Other | Admitting: Certified Registered Nurse Anesthetist

## 2017-07-26 ENCOUNTER — Encounter: Payer: Self-pay | Admitting: *Deleted

## 2017-07-26 ENCOUNTER — Encounter
Admission: RE | Disposition: A | Discharge status: Home / Self Care | Payer: Self-pay | Source: Ambulatory Visit | Attending: General Surgery

## 2017-07-26 ENCOUNTER — Ambulatory Visit
Admission: RE | Admit: 2017-07-26 | Discharge: 2017-07-26 | Disposition: A | Discharge status: Home / Self Care | Payer: Medicare Other | Source: Ambulatory Visit | Attending: General Surgery | Admitting: General Surgery

## 2017-07-26 DIAGNOSIS — Y838 Other surgical procedures as the cause of abnormal reaction of the patient, or of later complication, without mention of misadventure at the time of the procedure: Secondary | ICD-10-CM | POA: Diagnosis not present

## 2017-07-26 DIAGNOSIS — Z9221 Personal history of antineoplastic chemotherapy: Secondary | ICD-10-CM | POA: Diagnosis not present

## 2017-07-26 DIAGNOSIS — Z923 Personal history of irradiation: Secondary | ICD-10-CM | POA: Diagnosis not present

## 2017-07-26 DIAGNOSIS — Z85828 Personal history of other malignant neoplasm of skin: Secondary | ICD-10-CM | POA: Insufficient documentation

## 2017-07-26 DIAGNOSIS — Z7902 Long term (current) use of antithrombotics/antiplatelets: Secondary | ICD-10-CM | POA: Diagnosis not present

## 2017-07-26 DIAGNOSIS — Z90710 Acquired absence of both cervix and uterus: Secondary | ICD-10-CM | POA: Insufficient documentation

## 2017-07-26 DIAGNOSIS — Z79899 Other long term (current) drug therapy: Secondary | ICD-10-CM | POA: Diagnosis not present

## 2017-07-26 DIAGNOSIS — Z87891 Personal history of nicotine dependence: Secondary | ICD-10-CM | POA: Diagnosis not present

## 2017-07-26 DIAGNOSIS — Z853 Personal history of malignant neoplasm of breast: Secondary | ICD-10-CM | POA: Insufficient documentation

## 2017-07-26 DIAGNOSIS — Z888 Allergy status to other drugs, medicaments and biological substances status: Secondary | ICD-10-CM | POA: Insufficient documentation

## 2017-07-26 DIAGNOSIS — K219 Gastro-esophageal reflux disease without esophagitis: Secondary | ICD-10-CM | POA: Insufficient documentation

## 2017-07-26 DIAGNOSIS — N641 Fat necrosis of breast: Secondary | ICD-10-CM | POA: Insufficient documentation

## 2017-07-26 DIAGNOSIS — Z882 Allergy status to sulfonamides status: Secondary | ICD-10-CM | POA: Insufficient documentation

## 2017-07-26 DIAGNOSIS — R011 Cardiac murmur, unspecified: Secondary | ICD-10-CM | POA: Insufficient documentation

## 2017-07-26 DIAGNOSIS — F329 Major depressive disorder, single episode, unspecified: Secondary | ICD-10-CM | POA: Insufficient documentation

## 2017-07-26 DIAGNOSIS — F419 Anxiety disorder, unspecified: Secondary | ICD-10-CM | POA: Diagnosis not present

## 2017-07-26 DIAGNOSIS — M810 Age-related osteoporosis without current pathological fracture: Secondary | ICD-10-CM | POA: Diagnosis not present

## 2017-07-26 DIAGNOSIS — N189 Chronic kidney disease, unspecified: Secondary | ICD-10-CM | POA: Insufficient documentation

## 2017-07-26 DIAGNOSIS — I129 Hypertensive chronic kidney disease with stage 1 through stage 4 chronic kidney disease, or unspecified chronic kidney disease: Secondary | ICD-10-CM | POA: Insufficient documentation

## 2017-07-26 DIAGNOSIS — N6489 Other specified disorders of breast: Secondary | ICD-10-CM | POA: Insufficient documentation

## 2017-07-26 DIAGNOSIS — I252 Old myocardial infarction: Secondary | ICD-10-CM | POA: Diagnosis not present

## 2017-07-26 DIAGNOSIS — T8189XA Other complications of procedures, not elsewhere classified, initial encounter: Secondary | ICD-10-CM | POA: Insufficient documentation

## 2017-07-26 HISTORY — DX: Cerebral infarction, unspecified: I63.9

## 2017-07-26 HISTORY — PX: MASTECTOMY, PARTIAL: SHX709

## 2017-07-26 HISTORY — PX: BREAST EXCISIONAL BIOPSY: SUR124

## 2017-07-26 SURGERY — MASTECTOMY PARTIAL
Anesthesia: General | Laterality: Left | Wound class: Clean

## 2017-07-26 MED ORDER — CLINDAMYCIN PHOSPHATE 900 MG/50ML IV SOLN
INTRAVENOUS | Status: AC
  Filled 2017-07-26: qty 50

## 2017-07-26 MED ORDER — CLOPIDOGREL BISULFATE 75 MG PO TABS: 75.0000 mg | ORAL_TABLET | Freq: Every day | ORAL | 0 refills | Status: DC

## 2017-07-26 MED ORDER — EPHEDRINE SULFATE 50 MG/ML IJ SOLN
INTRAMUSCULAR | Status: DC | PRN
  Administered 2017-07-26 (×3): 5 mg via INTRAVENOUS

## 2017-07-26 MED ORDER — FENTANYL CITRATE (PF) 100 MCG/2ML IJ SOLN
INTRAMUSCULAR | Status: AC
  Administered 2017-07-26: 25 ug via INTRAVENOUS
  Filled 2017-07-26: qty 2

## 2017-07-26 MED ORDER — HYDROCODONE-ACETAMINOPHEN 5-325 MG PO TABS
1.0000 | ORAL_TABLET | ORAL | Status: DC | PRN
  Administered 2017-07-26: 1 via ORAL

## 2017-07-26 MED ORDER — BUPIVACAINE HCL (PF) 0.5 % IJ SOLN
INTRAMUSCULAR | Status: AC
  Filled 2017-07-26: qty 30

## 2017-07-26 MED ORDER — HYDROCODONE-ACETAMINOPHEN 5-325 MG PO TABS
ORAL_TABLET | ORAL | Status: AC
  Filled 2017-07-26: qty 1

## 2017-07-26 MED ORDER — SUGAMMADEX SODIUM 200 MG/2ML IV SOLN
INTRAVENOUS | Status: DC | PRN
  Administered 2017-07-26: 174.2 mg via INTRAVENOUS

## 2017-07-26 MED ORDER — SODIUM CHLORIDE 0.9 % IV SOLN
INTRAVENOUS | Status: DC
  Administered 2017-07-26: 25 mL/h via INTRAVENOUS

## 2017-07-26 MED ORDER — ROCURONIUM BROMIDE 100 MG/10ML IV SOLN
INTRAVENOUS | Status: DC | PRN
  Administered 2017-07-26 (×2): 10 mg via INTRAVENOUS

## 2017-07-26 MED ORDER — MIDAZOLAM HCL 2 MG/2ML IJ SOLN
INTRAMUSCULAR | Status: AC
  Filled 2017-07-26: qty 2

## 2017-07-26 MED ORDER — ONDANSETRON HCL 4 MG/2ML IJ SOLN
INTRAMUSCULAR | Status: AC
  Filled 2017-07-26: qty 2

## 2017-07-26 MED ORDER — ONDANSETRON HCL 4 MG/2ML IJ SOLN
INTRAMUSCULAR | Status: DC | PRN
  Administered 2017-07-26: 4 mg via INTRAVENOUS

## 2017-07-26 MED ORDER — HYDROCODONE-ACETAMINOPHEN 5-325 MG PO TABS: 1.0000 | ORAL_TABLET | ORAL | 0 refills | Status: DC | PRN

## 2017-07-26 MED ORDER — LIDOCAINE HCL (PF) 2 % IJ SOLN
INTRAMUSCULAR | Status: AC
Start: 2017-07-26 — End: ?
  Filled 2017-07-26: qty 2

## 2017-07-26 MED ORDER — LIDOCAINE HCL (CARDIAC) 20 MG/ML IV SOLN
INTRAVENOUS | Status: DC | PRN
  Administered 2017-07-26: 80 mg via INTRAVENOUS

## 2017-07-26 MED ORDER — FENTANYL CITRATE (PF) 100 MCG/2ML IJ SOLN
25.0000 ug | INTRAMUSCULAR | Status: AC | PRN
  Administered 2017-07-26 (×6): 25 ug via INTRAVENOUS

## 2017-07-26 MED ORDER — FENTANYL CITRATE (PF) 100 MCG/2ML IJ SOLN
INTRAMUSCULAR | Status: DC | PRN
  Administered 2017-07-26 (×2): 25 ug via INTRAVENOUS
  Administered 2017-07-26: 50 ug via INTRAVENOUS

## 2017-07-26 MED ORDER — FENTANYL CITRATE (PF) 100 MCG/2ML IJ SOLN
INTRAMUSCULAR | Status: AC
  Filled 2017-07-26: qty 2

## 2017-07-26 MED ORDER — PROPOFOL 10 MG/ML IV BOLUS
INTRAVENOUS | Status: DC | PRN
  Administered 2017-07-26: 140 mg via INTRAVENOUS

## 2017-07-26 MED ORDER — ACETAMINOPHEN 10 MG/ML IV SOLN
INTRAVENOUS | Status: DC | PRN
  Administered 2017-07-26: 1000 mg via INTRAVENOUS

## 2017-07-26 MED ORDER — SUCCINYLCHOLINE CHLORIDE 20 MG/ML IJ SOLN
INTRAMUSCULAR | Status: DC | PRN
  Administered 2017-07-26: 100 mg via INTRAVENOUS

## 2017-07-26 MED ORDER — DEXAMETHASONE SODIUM PHOSPHATE 10 MG/ML IJ SOLN
INTRAMUSCULAR | Status: DC | PRN
  Administered 2017-07-26: 5 mg via INTRAVENOUS

## 2017-07-26 MED ORDER — DEXAMETHASONE SODIUM PHOSPHATE 10 MG/ML IJ SOLN
INTRAMUSCULAR | Status: AC
  Filled 2017-07-26: qty 1

## 2017-07-26 MED ORDER — ONDANSETRON HCL 4 MG/2ML IJ SOLN: 4.0000 mg | Freq: Once | INTRAMUSCULAR | Status: DC | PRN

## 2017-07-26 MED ORDER — BUPIVACAINE HCL (PF) 0.5 % IJ SOLN
INTRAMUSCULAR | Status: DC | PRN
  Administered 2017-07-26: 30 mL

## 2017-07-26 MED ORDER — BUPIVACAINE-EPINEPHRINE (PF) 0.5% -1:200000 IJ SOLN
INTRAMUSCULAR | Status: AC
  Filled 2017-07-26: qty 30

## 2017-07-26 MED ORDER — PROPOFOL 10 MG/ML IV BOLUS
INTRAVENOUS | Status: AC
  Filled 2017-07-26: qty 20

## 2017-07-26 SURGICAL SUPPLY — 52 items
BANDAGE ELASTIC 6 LF NS (GAUZE/BANDAGES/DRESSINGS) ×3 IMPLANT
BLADE SURG 15 STRL SS SAFETY (BLADE) ×6 IMPLANT
BNDG GAUZE 4.5X4.1 6PLY STRL (MISCELLANEOUS) ×3 IMPLANT
BRA SURGICAL LRG (MISCELLANEOUS) ×3 IMPLANT
BULB RESERV EVAC DRAIN JP 100C (MISCELLANEOUS) IMPLANT
CANISTER SUCT 1200ML W/VALVE (MISCELLANEOUS) ×3 IMPLANT
CHLORAPREP W/TINT 26ML (MISCELLANEOUS) ×3 IMPLANT
CLOSURE WOUND 1/2 X4 (GAUZE/BANDAGES/DRESSINGS) ×1
CNTNR SPEC 2.5X3XGRAD LEK (MISCELLANEOUS) ×1
CONT SPEC 4OZ STER OR WHT (MISCELLANEOUS) ×2
CONTAINER SPEC 2.5X3XGRAD LEK (MISCELLANEOUS) ×1 IMPLANT
COVER PROBE FLX POLY STRL (MISCELLANEOUS) ×3 IMPLANT
DEVICE DUBIN SPECIMEN MAMMOGRA (MISCELLANEOUS) IMPLANT
DRAIN CHANNEL JP 15F RND 16 (MISCELLANEOUS) IMPLANT
DRAPE LAPAROTOMY 100X77 ABD (DRAPES) IMPLANT
DRAPE LAPAROTOMY TRNSV 106X77 (MISCELLANEOUS) ×3 IMPLANT
DRSG TELFA 3X8 NADH (GAUZE/BANDAGES/DRESSINGS) ×3 IMPLANT
DRSG TELFA 4X3 1S NADH ST (GAUZE/BANDAGES/DRESSINGS) ×6 IMPLANT
ELECT CAUTERY BLADE TIP 2.5 (TIP) ×3
ELECT REM PT RETURN 9FT ADLT (ELECTROSURGICAL) ×3
ELECTRODE CAUTERY BLDE TIP 2.5 (TIP) ×1 IMPLANT
ELECTRODE REM PT RTRN 9FT ADLT (ELECTROSURGICAL) ×1 IMPLANT
GAUZE FLUFF 18X24 1PLY STRL (GAUZE/BANDAGES/DRESSINGS) ×3 IMPLANT
GAUZE SPONGE 4X4 12PLY STRL (GAUZE/BANDAGES/DRESSINGS) ×3 IMPLANT
GLOVE BIO SURGEON STRL SZ7.5 (GLOVE) ×6 IMPLANT
GLOVE INDICATOR 8.0 STRL GRN (GLOVE) ×6 IMPLANT
GOWN STRL REUS W/ TWL LRG LVL3 (GOWN DISPOSABLE) ×2 IMPLANT
GOWN STRL REUS W/TWL LRG LVL3 (GOWN DISPOSABLE) ×4
KIT RM TURNOVER STRD PROC AR (KITS) ×3 IMPLANT
LABEL OR SOLS (LABEL) ×3 IMPLANT
MARGIN MAP 10MM (MISCELLANEOUS) ×3 IMPLANT
NDL SAFETY 22GX1.5 (NEEDLE) ×3 IMPLANT
NEEDLE HYPO 25X1 1.5 SAFETY (NEEDLE) ×6 IMPLANT
PACK BASIN MINOR ARMC (MISCELLANEOUS) ×3 IMPLANT
SHEARS FOC LG CVD HARMONIC 17C (MISCELLANEOUS) IMPLANT
SHEARS HARMONIC 9CM CVD (BLADE) IMPLANT
STAPLER SKIN PROX 35W (STAPLE) ×3 IMPLANT
STRIP CLOSURE SKIN 1/2X4 (GAUZE/BANDAGES/DRESSINGS) ×2 IMPLANT
SUT ETHILON 3-0 FS-10 30 BLK (SUTURE) ×3
SUT SILK 2 0 (SUTURE) ×2
SUT SILK 2-0 18XBRD TIE 12 (SUTURE) ×1 IMPLANT
SUT VIC AB 2-0 CT1 27 (SUTURE) ×2
SUT VIC AB 2-0 CT1 TAPERPNT 27 (SUTURE) ×1 IMPLANT
SUT VIC AB 4-0 FS2 27 (SUTURE) ×6 IMPLANT
SUT VICRYL+ 3-0 144IN (SUTURE) ×3 IMPLANT
SUTURE EHLN 3-0 FS-10 30 BLK (SUTURE) ×1 IMPLANT
SWABSTK COMLB BENZOIN TINCTURE (MISCELLANEOUS) ×3 IMPLANT
SYR BULB IRRIG 60ML STRL (SYRINGE) ×3 IMPLANT
SYR CONTROL 10ML (SYRINGE) ×3 IMPLANT
SYRINGE 10CC LL (SYRINGE) ×3 IMPLANT
TAPE TRANSPORE STRL 2 31045 (GAUZE/BANDAGES/DRESSINGS) ×3 IMPLANT
WATER STERILE IRR 1000ML POUR (IV SOLUTION) ×3 IMPLANT

## 2017-07-26 NOTE — Discharge Instructions (Signed)
General Anesthesia, Adult, Care After °These instructions provide you with information about caring for yourself after your procedure. Your health care provider may also give you more specific instructions. Your treatment has been planned according to current medical practices, but problems sometimes occur. Call your health care provider if you have any problems or questions after your procedure. °What can I expect after the procedure? °After the procedure, it is common to have: °· Vomiting. °· A sore throat. °· Mental slowness. ° °It is common to feel: °· Nauseous. °· Cold or shivery. °· Sleepy. °· Tired. °· Sore or achy, even in parts of your body where you did not have surgery. ° °Follow these instructions at home: °For at least 24 hours after the procedure: °· Do not: °? Participate in activities where you could fall or become injured. °? Drive. °? Use heavy machinery. °? Drink alcohol. °? Take sleeping pills or medicines that cause drowsiness. °? Make important decisions or sign legal documents. °? Take care of children on your own. °· Rest. °Eating and drinking °· If you vomit, drink water, juice, or soup when you can drink without vomiting. °· Drink enough fluid to keep your urine clear or pale yellow. °· Make sure you have little or no nausea before eating solid foods. °· Follow the diet recommended by your health care provider. °General instructions °· Have a responsible adult stay with you until you are awake and alert. °· Return to your normal activities as told by your health care provider. Ask your health care provider what activities are safe for you. °· Take over-the-counter and prescription medicines only as told by your health care provider. °· If you smoke, do not smoke without supervision. °· Keep all follow-up visits as told by your health care provider. This is important. °Contact a health care provider if: °· You continue to have nausea or vomiting at home, and medicines are not helpful. °· You  cannot drink fluids or start eating again. °· You cannot urinate after 8-12 hours. °· You develop a skin rash. °· You have fever. °· You have increasing redness at the site of your procedure. °Get help right away if: °· You have difficulty breathing. °· You have chest pain. °· You have unexpected bleeding. °· You feel that you are having a life-threatening or urgent problem. °This information is not intended to replace advice given to you by your health care provider. Make sure you discuss any questions you have with your health care provider. °Document Released: 03/18/2001 Document Revised: 05/14/2016 Document Reviewed: 11/24/2015 °Elsevier Interactive Patient Education © 2018 [employer] Inc[employer] ° °

## 2017-07-26 NOTE — Op Note (Signed)
Preoperative diagnosis: Chronic wound left breast secondary to fat necrosis.  Postoperative diagnosis: Same.  Operative procedure: Excision chronic wound secondary to fat necrosis left breast.  Operating surgeon: Ollen Bowl, M.D.  Anesthesia: Gen. endotracheal, Marcaine 0.5%, plain, 30 mL.  Blood loss: 10 mL.  Clinical note: This 82 year old woman had previously undergone treatment for breast cancer including whole breast radiation. She was identified several years ago with a squamous cell carcinoma that required excision. Since then she's had intermittent drainage and inflammation involving the surgical site in the upper inner aspect of the left breast. Clinical and imaging suggests this is secondary to fat necrosis. Previous operative debridements in the office have failed to resolve chronic drainage. She is brought to the operative for wide excision.  The patient received clindamycin for an buttock prophylaxis based on her multiple medical allergies.  Operative note: With the patient under adequate general endotracheal anesthesia the  breast was prepped with Betadine due to the open wound and draped. Ultrasound was used to confirm the area of coarse calcification from fat necrosis. This encompassed a 3 x 6 cm area. A 10 cm elliptical incision was used to encompass the area of chronic irritation and thickening. This was incised sharply with the knife and the remaining dissection completed with electrocautery. Most the chronic inflammatory process was confined within the top 2 cm of the large, pendulous breast. There was one focus in the more medial aspect that extended down almost to the pectoralis fascia. After all of the grossly thickened tissue had been removed and the remaining fat appeared normal area was irrigated with water. The adipose layer was approximated with 2 layers of interrupted 2-0 PDS figure-of-eight sutures. The weight of the lateral breast was skewing the wound into a  rhomboid shape and interrupted 4-0 subcuticular sutures were placed to re-orientate the wound. Staples were applied to the skin. Telfa pad, fluffed gauze and a surgical bra were applied.  The patient tolerated the procedure well was taken recovery room in stable condition.

## 2017-07-26 NOTE — OR Nursing (Signed)
Patient has been very confused about her medications.  She has stated that she has continued her Plavix then said that she has stopped it on the 27th after speaking with Dr. Curly [place] office.  She is pretty sure that she did stop Plavix as told.

## 2017-07-26 NOTE — Transfer of Care (Signed)
Immediate Anesthesia Transfer of Care Note  Patient: Hailey Ellison  Procedure(s) Performed: Procedure(s): MASTECTOMY PARTIAL (Left)  Patient Location: PACU  Anesthesia Type:General  Level of Consciousness: awake and alert   Airway & Oxygen Therapy: Patient Spontanous Breathing and Patient connected to face mask oxygen  Post-op Assessment: Report given to RN and Post -op Vital signs reviewed and stable  Post vital signs: Reviewed and stable  Last Vitals:  Vitals:   07/26/17 0613  BP: (!) 175/70  Pulse: 72  Resp: 18  Temp: 36.4 C    Last Pain:  Vitals:   07/26/17 0613  TempSrc: Tympanic         Complications: No apparent anesthesia complications

## 2017-07-26 NOTE — Anesthesia Procedure Notes (Signed)
Procedure Name: Intubation Performed by: Floria Brandau Pre-anesthesia Checklist: Patient identified, Patient being monitored, Timeout performed, Emergency Drugs available and Suction available Patient Re-evaluated:Patient Re-evaluated prior to induction Oxygen Delivery Method: Circle system utilized Preoxygenation: Pre-oxygenation with 100% oxygen Induction Type: IV induction Ventilation: Mask ventilation without difficulty Laryngoscope Size: Mac and 3 Grade View: Grade I Tube type: Oral Tube size: 7.0 mm Number of attempts: 1 Airway Equipment and Method: Stylet Placement Confirmation: ETT inserted through vocal cords under direct vision,  positive ETCO2 and breath sounds checked- equal and bilateral Secured at: 21 cm Tube secured with: Tape Dental Injury: Teeth and Oropharynx as per pre-operative assessment        

## 2017-07-26 NOTE — Anesthesia Preprocedure Evaluation (Signed)
Anesthesia Evaluation  Patient identified by MRN, date of birth, ID band Patient awake    Reviewed: Allergy & Precautions, NPO status , Patient's Chart, lab work & pertinent test results  Airway Mallampati: II  TM Distance: >3 FB     Dental   Pulmonary former smoker,    Pulmonary exam normal        Cardiovascular hypertension, Pt. on medications + Past MI  Normal cardiovascular exam+ Valvular Problems/Murmurs      Neuro/Psych PSYCHIATRIC DISORDERS Anxiety Depression CVA    GI/Hepatic Neg liver ROS, GERD  ,  Endo/Other  negative endocrine ROS  Renal/GU Renal InsufficiencyRenal disease  negative genitourinary   Musculoskeletal  (+) Arthritis , Osteoarthritis,    Abdominal Normal abdominal exam  (+)   Peds negative pediatric ROS (+)  Hematology negative hematology ROS (+)   Anesthesia Other Findings Past Medical History: No date: Anxiety No date: Arthritis 2010: Breast cancer ([place])     Comment:  Adjuvant chemotherapy with               Taxotere/Carboplatin/Herceptin. Arimidex initiated               November 2011 2010: Cancer [facility])     Comment:  T1c,N2a, M0; ER 90%, PR 60%,  HER-2/neu 2+ IHC,               equivocal on fish. T1N2M0 (clinical stage IIIA) grade 3               invasive ductal carcinoma of the left breast status post               lumpectomy, sentinel node and axillary node dissection               March 09, 2009.  Margins clear. 2014: Chronic kidney disease No date: Depression No date: GERD (gastroesophageal reflux disease) 06/09/2016: Heart attack ([place]) No date: Hypertension No date: Lymphedema     Comment:  left arm No date: Murmur No date: Osteoporosis 2010: Radiation     Comment:  with chemo June 2016: Squamous cell carcinoma     Comment:  Left upper inner chest wall, area of previous radiation. No date: Stroke [facility])     Comment:  per pt, TIA a long time ago. No date: Tinnitus  Reproductive/Obstetrics                             Anesthesia Physical Anesthesia Plan  ASA: III  Anesthesia Plan: General   Post-op Pain Management:    Induction: Intravenous  PONV Risk Score and Plan: 3 and Ondansetron, Dexamethasone, Midazolam and Propofol infusion  Airway Management Planned: Oral ETT  Additional Equipment:   Intra-op Plan:   Post-operative Plan:   Informed Consent: I have reviewed the patients History and Physical, chart, labs and discussed the procedure including the risks, benefits and alternatives for the proposed anesthesia with the patient or authorized representative who has indicated his/her understanding and acceptance.   Dental advisory given  Plan Discussed with: CRNA and Surgeon  Anesthesia Plan Comments: (Patient has lymphedema of L arm)        Anesthesia Quick Evaluation

## 2017-07-26 NOTE — Anesthesia Postprocedure Evaluation (Signed)
Anesthesia Post Note  Patient: Hailey Ellison  Procedure(s) Performed: Procedure(s) (LRB): MASTECTOMY PARTIAL (Left)  Patient location during evaluation: PACU Anesthesia Type: General Level of consciousness: awake and alert and oriented Pain management: pain level controlled Vital Signs Assessment: post-procedure vital signs reviewed and stable Respiratory status: spontaneous breathing Cardiovascular status: blood pressure returned to baseline Anesthetic complications: no     Last Vitals:  Vitals:   07/26/17 1015 07/26/17 1046  BP: (!) 149/72 (!) 139/59  Pulse: 76 80  Resp: 16 15  Temp: (!) 36.2 C     Last Pain:  Vitals:   07/26/17 1046  TempSrc:   PainSc: 2                  Shayle Donahoo

## 2017-07-26 NOTE — H&P (Signed)
No change in clinical history or exam.  For excision of chronic abscess left chest/breast.

## 2017-07-26 NOTE — Anesthesia Post-op Follow-up Note (Cosign Needed)
Anesthesia QCDR form completed.        

## 2017-07-29 ENCOUNTER — Telehealth: Payer: Self-pay | Admitting: General Surgery

## 2017-07-29 LAB — SURGICAL PATHOLOGY

## 2017-07-29 NOTE — Telephone Encounter (Signed)
Notified the patient that the pathology showed only fat necrosis, as expected.  Follow-up tomorrow as planned.

## 2017-07-30 ENCOUNTER — Encounter: Payer: Self-pay | Admitting: General Surgery

## 2017-07-30 ENCOUNTER — Ambulatory Visit (INDEPENDENT_AMBULATORY_CARE_PROVIDER_SITE_OTHER): Payer: Medicare Other | Admitting: General Surgery

## 2017-07-30 VITALS — BP 160/82 | HR 84 | Resp 16 | Ht 67.0 in | Wt 187.0 lb

## 2017-07-30 DIAGNOSIS — N641 Fat necrosis of breast: Secondary | ICD-10-CM

## 2017-07-30 NOTE — Progress Notes (Signed)
Patient ID: Hailey Ellison, female   DOB: 1935/11/27, 82 y.o.   MRN: [id number]  Chief Complaint  Patient presents with  . Follow-up    HPI Hailey Ellison is a 82 y.o. female here today fro her follow up left partial mastectomy done on 07/26/2017.  The patient reports that she had blood noted on the lateral aspect of her dressing.  HPI  Past Medical History:  Diagnosis Date  . Anxiety   . Arthritis   . Breast cancer ([place]) 2010   Adjuvant chemotherapy with Taxotere/Carboplatin/Herceptin. Arimidex initiated November 2011  . Cancer ([place]) 2010   T1c,N2a, M0; ER 90%, PR 60%,  HER-2/neu 2+ IHC, equivocal on fish. 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  Margins clear.  . Chronic kidney disease 2014  . Depression   . GERD (gastroesophageal reflux disease)   . Heart attack ([place]) 06/09/2016  . Hypertension   . Lymphedema    left arm  . Murmur   . Osteoporosis   . Radiation 2010   with chemo  . Squamous cell carcinoma June 2016   Left upper inner chest wall, area of previous radiation.  . Stroke ([place])    per pt, TIA a long time ago.  . Tinnitus     Past Surgical History:  Procedure Laterality Date  . ABDOMINAL HYSTERECTOMY    . BREAST BIOPSY Left 2010   positive  . BREAST BIOPSY Right    neg  . BREAST CYST EXCISION    . BREAST LUMPECTOMY Left 2010  . CARDIAC CATHETERIZATION N/A 06/08/2016   Procedure: Left Heart Cath and Coronary Angiography;  Surgeon: Wellington Hampshire, MD;  Location: [place] [facility];  Service: Cardiovascular;  Laterality: N/A;  . CARDIAC CATHETERIZATION N/A 06/08/2016   Procedure: Coronary Stent Intervention;  Surgeon: Wellington Hampshire, MD;  Location: [place] [facility];  Service: Cardiovascular;  Laterality: N/A;  . CARPAL TUNNEL RELEASE Bilateral   . CHOLECYSTECTOMY    . JOINT REPLACEMENT Left 2008   Total Knee Replacement  .  MASTECTOMY, PARTIAL Left 07/26/2017   Procedure: MASTECTOMY PARTIAL;  Surgeon: Robert Bellow, MD;  Location: [facility]S;  Service: General;  Laterality: Left;  . PARTIAL HYSTERECTOMY  1980's  . PORT-A-CATH REMOVAL      Family History  Problem Relation Age of Onset  . Cancer Sister        breast  . Cancer Sister        kidney  . Hypertension Mother   . Hypertension Father     Social History Social History  Substance Use Topics  . Smoking status: Former Smoker    Types: Cigarettes  . Smokeless tobacco: Never Used     Comment: only smoked a few cigs throughout her lifetime  . Alcohol use No    Allergies  Allergen Reactions  . Alendronate Shortness Of Breath  . Benadryl [Diphenhydramine Hcl] Hives  . Tegaderm Ag Mesh [Silver]     tegaderm causes rash  . Neosporin [Neomycin-Bacitracin Zn-Polymyx] Rash    Tolerates Polysporin(bacitracin zinc - polymyxin B)  . Sulfa Antibiotics Rash  . Tape Rash    Tape and bandaids    Current Outpatient Prescriptions  Medication Sig Dispense Refill  . anastrozole (ARIMIDEX) 1 MG tablet Take 1 tablet (1 mg total) by mouth daily. 90 tablet 1  . aspirin 81 MG chewable tablet Chew 1 tablet (  81 mg total) by mouth daily. 30 tablet 2  . atorvastatin (LIPITOR) 80 MG tablet Take 80 mg by mouth at bedtime.    . calcium citrate-vitamin D (CITRACAL+D) 315-200 MG-UNIT per tablet Take 1 tablet by mouth 2 (two) times daily.    Derrill Memo ON 08/02/2017] clopidogrel (PLAVIX) 75 MG tablet Take 1 tablet (75 mg total) by mouth daily. 1 tablet 0  . ergocalciferol (VITAMIN D2) 50000 units capsule Take 50,000 Units by mouth every 30 (thirty) days.     . furosemide (LASIX) 20 MG tablet Take 20 mg by mouth daily as needed for fluid.     Marland Kitchen HYDROcodone-acetaminophen (NORCO) 5-325 MG tablet Take 1 tablet by mouth every 4 (four) hours as needed for moderate pain. 20 tablet 0  . lansoprazole (PREVACID) 30 MG capsule Take 30 mg by mouth daily.    Marland Kitchen losartan (COZAAR) 50 MG  tablet Take 50 mg by mouth daily.    . Multiple Vitamin (MULTIVITAMIN WITH MINERALS) TABS Take 1 tablet by mouth daily.    Marland Kitchen nystatin cream (MYCOSTATIN) Apply 1 application topically 2 (two) times daily.    Marland Kitchen PARoxetine (PAXIL) 40 MG tablet Take 40 mg by mouth at bedtime.     . vitamin B-12 (CYANOCOBALAMIN) 500 MCG tablet Take 500 mcg by mouth daily.     No current facility-administered medications for this visit.     Review of Systems Review of Systems  Constitutional: Negative.   Cardiovascular: Negative.     Blood pressure (!) 160/82, pulse 84, resp. rate 16, height '5\' 7"'$  (1.702 m), weight 187 lb (84.8 kg).  Physical Exam Physical Exam  Constitutional: She is oriented to person, place, and time. She appears well-developed and well-nourished.  Pulmonary/Chest:    Incision with staples right breast, clean  Neurological: She is alert and oriented to person, place, and time.  Skin: Skin is warm and dry.  Psychiatric: Her behavior is normal.    Data Reviewed DIAGNOSIS:  A. BREAST, LEFT; WIDE EXCISION:  - ORGANIZED FAT NECROSIS WITH CALCIFICATION.  - NEGATIVE FOR MALIGNANCY.     Assessment    Doing well status post excision of area of chronic fat necrosis.    Plan    The patient may shower. She's been encouraged to continue to wear her bra day and night for support.     Follow up in one week.  HPI, Physical Exam, Assessment and Plan have been scribed under the direction and in the presence of Hervey Ard, MD.  Gaspar Cola, CMA  I have completed the exam and reviewed the above documentation for accuracy and completeness.  I agree with the above.  Haematologist has been used and any errors in dictation or transcription are unintentional.  Hervey Ard, M.D., F.A.C.S.  Robert Bellow 07/31/2017, 7:00 AM

## 2017-07-30 NOTE — Patient Instructions (Signed)
The patient is aware to call back for any questions or concerns.  

## 2017-08-05 ENCOUNTER — Ambulatory Visit (INDEPENDENT_AMBULATORY_CARE_PROVIDER_SITE_OTHER): Payer: Medicare Other | Admitting: General Surgery

## 2017-08-05 ENCOUNTER — Ambulatory Visit: Payer: Medicare Other | Admitting: General Surgery

## 2017-08-05 ENCOUNTER — Encounter: Payer: Self-pay | Admitting: General Surgery

## 2017-08-05 VITALS — BP 130/78 | HR 80 | Resp 14 | Ht 67.0 in | Wt 187.8 lb

## 2017-08-05 DIAGNOSIS — I890 Lymphedema, not elsewhere classified: Secondary | ICD-10-CM

## 2017-08-05 DIAGNOSIS — N641 Fat necrosis of breast: Secondary | ICD-10-CM

## 2017-08-05 NOTE — Progress Notes (Signed)
Patient ID: Hailey Ellison, female   DOB: 05-12-1935, 82 y.o.   MRN: [id number]  Chief Complaint  Patient presents with  . Routine Post Op    left breast excision    HPI Hailey Ellison is a 82 y.o. female here for a post op left breast wide excision done on 07/26/17. She reports that she is doing very well. She is only using the prescription pain medication at night when she needs it.  HPI  Past Medical History:  Diagnosis Date  . Anxiety   . Arthritis   . Breast cancer ([place]) 2010   Adjuvant chemotherapy with Taxotere/Carboplatin/Herceptin. Arimidex initiated November 2011  . Cancer ([place]) 2010   T1c,N2a, M0; ER 90%, PR 60%,  HER-2/neu 2+ IHC, equivocal on fish. 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  Margins clear.  . Chronic kidney disease 2014  . Depression   . GERD (gastroesophageal reflux disease)   . Heart attack ([place]) 06/09/2016  . Hypertension   . Lymphedema    left arm  . Murmur   . Osteoporosis   . Radiation 2010   with chemo  . Squamous cell carcinoma June 2016   Left upper inner chest wall, area of previous radiation.  . Stroke ([place])    per pt, TIA a long time ago.  . Tinnitus     Past Surgical History:  Procedure Laterality Date  . ABDOMINAL HYSTERECTOMY    . BREAST BIOPSY Left 2010   positive  . BREAST BIOPSY Right    neg  . BREAST CYST EXCISION    . BREAST LUMPECTOMY Left 2010  . CARDIAC CATHETERIZATION N/A 06/08/2016   Procedure: Left Heart Cath and Coronary Angiography;  Surgeon: Wellington Hampshire, MD;  Location: [place] [facility];  Service: Cardiovascular;  Laterality: N/A;  . CARDIAC CATHETERIZATION N/A 06/08/2016   Procedure: Coronary Stent Intervention;  Surgeon: Wellington Hampshire, MD;  Location: [place] [facility];  Service: Cardiovascular;  Laterality: N/A;  . CARPAL TUNNEL RELEASE Bilateral   . CHOLECYSTECTOMY    . JOINT  REPLACEMENT Left 2008   Total Knee Replacement  . MASTECTOMY, PARTIAL Left 07/26/2017   Procedure: MASTECTOMY PARTIAL;  Surgeon: Robert Bellow, MD;  Location: [facility];  Service: General;  Laterality: Left;  . PARTIAL HYSTERECTOMY  1980's  . PORT-A-CATH REMOVAL      Family History  Problem Relation Age of Onset  . Cancer Sister        breast  . Cancer Sister        kidney  . Hypertension Mother   . Hypertension Father     Social History Social History  Substance Use Topics  . Smoking status: Former Smoker    Types: Cigarettes  . Smokeless tobacco: Never Used     Comment: only smoked a few cigs throughout her lifetime  . Alcohol use No    Allergies  Allergen Reactions  . Alendronate Shortness Of Breath  . Benadryl [Diphenhydramine Hcl] Hives  . Tegaderm Ag Mesh [Silver]     tegaderm causes rash  . Neosporin [Neomycin-Bacitracin Zn-Polymyx] Rash    Tolerates Polysporin(bacitracin zinc - polymyxin B)  . Sulfa Antibiotics Rash  . Tape Rash    Tape and bandaids    Current Outpatient Prescriptions  Medication Sig Dispense Refill  . anastrozole (ARIMIDEX) 1 MG tablet Take 1 tablet (1 mg total) by mouth  daily. 90 tablet 1  . aspirin 81 MG chewable tablet Chew 1 tablet (81 mg total) by mouth daily. 30 tablet 2  . atorvastatin (LIPITOR) 80 MG tablet Take 80 mg by mouth at bedtime.    . calcium citrate-vitamin D (CITRACAL+D) 315-200 MG-UNIT per tablet Take 1 tablet by mouth 2 (two) times daily.    . clopidogrel (PLAVIX) 75 MG tablet Take 1 tablet (75 mg total) by mouth daily. 1 tablet 0  . ergocalciferol (VITAMIN D2) 50000 units capsule Take 50,000 Units by mouth every 30 (thirty) days.     . furosemide (LASIX) 20 MG tablet Take 20 mg by mouth daily as needed for fluid.     Marland Kitchen HYDROcodone-acetaminophen (NORCO) 5-325 MG tablet Take 1 tablet by mouth every 4 (four) hours as needed for moderate pain. 20 tablet 0  . lansoprazole (PREVACID) 30 MG capsule Take 30 mg by mouth  daily.    Marland Kitchen losartan (COZAAR) 50 MG tablet Take 50 mg by mouth daily.    . Multiple Vitamin (MULTIVITAMIN WITH MINERALS) TABS Take 1 tablet by mouth daily.    Marland Kitchen nystatin cream (MYCOSTATIN) Apply 1 application topically 2 (two) times daily.    Marland Kitchen PARoxetine (PAXIL) 40 MG tablet Take 40 mg by mouth at bedtime.     . vitamin B-12 (CYANOCOBALAMIN) 500 MCG tablet Take 500 mcg by mouth daily.     No current facility-administered medications for this visit.     Review of Systems Review of Systems  Constitutional: Negative.   Respiratory: Negative.   Cardiovascular: Negative.     Blood pressure 130/78, pulse 80, resp. rate 14, height _0  (1.702 m), weight 187 lb 12.8 oz (85.2 kg).  Physical Exam Physical Exam  Constitutional: She appears well-developed and well-nourished.  Pulmonary/Chest:    Left breast excision site healing well.    Data Reviewed Fat necrosis  Assessment    Doing well status post wide excision of fat necrosis.    Plan    Staples were removed by the nurse. Benzoin and Steri-Strips applied.  We'll plan for follow-up in one month    HPI, Physical Exam, Assessment and Plan have been scribed under the direction and in the presence of Robert Bellow, MD  Concepcion Living, LPN  I have completed the exam and reviewed the above documentation for accuracy and completeness.  I agree with the above.  Haematologist has been used and any errors in dictation or transcription are unintentional.  Hervey Ard, M.D., F.A.C.S.  Robert Bellow 08/06/2017, 5:08 PM

## 2017-08-12 ENCOUNTER — Ambulatory Visit (INDEPENDENT_AMBULATORY_CARE_PROVIDER_SITE_OTHER): Payer: Medicare Other | Admitting: *Deleted

## 2017-08-12 DIAGNOSIS — N641 Fat necrosis of breast: Secondary | ICD-10-CM

## 2017-08-12 NOTE — Progress Notes (Signed)
Patient came in today for a wound check incision site.  The wound is clean, with no signs of infection noted. Small flattened blood blister noted in area of steri strip that has come off. No open areas or drainage noted. Follow up as scheduled.

## 2017-08-19 ENCOUNTER — Ambulatory Visit: Payer: Medicare Other | Attending: General Surgery | Admitting: Occupational Therapy

## 2017-08-19 DIAGNOSIS — I972 Postmastectomy lymphedema syndrome: Secondary | ICD-10-CM | POA: Insufficient documentation

## 2017-08-19 NOTE — Therapy (Signed)
[facility] [address], [place], [address] Phone: [phone]   Fax:  [phone]  Occupational Therapy Evaluation  Patient Details  Name: Hailey Ellison MRN: [id number] Date of Birth: 08/04/1935 Referring Provider: Bary Castilla  Encounter Date: 08/19/2017      OT End of Session - 08/19/17 2055    Visit Number 1   Number of Visits 4   Date for OT Re-Evaluation 09/16/17   OT Start Time 1356   OT Stop Time 1445   OT Time Calculation (min) 49 min   Activity Tolerance Patient tolerated treatment well   Behavior During Therapy [facility] for tasks assessed/performed      Past Medical History:  Diagnosis Date  . Anxiety   . Arthritis   . Breast cancer ([place]) 2010   Adjuvant chemotherapy with Taxotere/Carboplatin/Herceptin. Arimidex initiated November 2011  . Cancer ([place]) 2010   T1c,N2a, M0; ER 90%, PR 60%,  HER-2/neu 2+ IHC, equivocal on fish. 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  Margins clear.  . Chronic kidney disease 2014  . Depression   . GERD (gastroesophageal reflux disease)   . Heart attack ([place]) 06/09/2016  . Hypertension   . Lymphedema    left arm  . Murmur   . Osteoporosis   . Radiation 2010   with chemo  . Squamous cell carcinoma June 2016   Left upper inner chest wall, area of previous radiation.  . Stroke ([place])    per pt, TIA a long time ago.  . Tinnitus     Past Surgical History:  Procedure Laterality Date  . ABDOMINAL HYSTERECTOMY    . BREAST BIOPSY Left 2010   positive  . BREAST BIOPSY Right    neg  . BREAST CYST EXCISION    . BREAST LUMPECTOMY Left 2010  . CARDIAC CATHETERIZATION N/A 06/08/2016   Procedure: Left Heart Cath and Coronary Angiography;  Surgeon: Wellington Hampshire, MD;  Location: [place] [facility];  Service: Cardiovascular;  Laterality: N/A;   . CARDIAC CATHETERIZATION N/A 06/08/2016   Procedure: Coronary Stent Intervention;  Surgeon: Wellington Hampshire, MD;  Location: [place] [facility];  Service: Cardiovascular;  Laterality: N/A;  . CARPAL TUNNEL RELEASE Bilateral   . CHOLECYSTECTOMY    . JOINT REPLACEMENT Left 2008   Total Knee Replacement  . MASTECTOMY, PARTIAL Left 07/26/2017   Procedure: MASTECTOMY PARTIAL;  Surgeon: Robert Bellow, MD;  Location: [facility] OR[facility];  Service: General;  Laterality: Left;  . PARTIAL HYSTERECTOMY  1980's  . PORT-A-CATH REMOVAL      There were no vitals filed for this visit.      Subjective Assessment - 08/19/17 2045    Subjective  I do not have a glove for a while - and my hand swell up some times - I seen you the last time in end of 2015 - I had MI - my night sleeve and day sleeve not working anymore    Patient Stated Goals I do need a new compression glove and also night time compression - do not want my arm to get worse    Currently in Pain? No/denies           [facility] OT Assessment - 08/19/17 0001      Assessment   Diagnosis L UE lymphedema    Referring Provider Byrnett   Onset Date --  2010   Prior Therapy --  Yes , seen several times thru 8 yrs for lymphedema      Home  Environment   Lives With Alone     Prior Function   Vocation Retired   Leisure Likes to spend time with family           LYMPHEDEMA/ONCOLOGY QUESTIONNAIRE - 08/19/17 1410      Right Upper Extremity Lymphedema   15 cm Proximal to Olecranon Process 30 cm   10 cm Proximal to Olecranon Process 28.5 cm   Olecranon Process 26.5 cm   15 cm Proximal to Ulnar Styloid Process 25.2 cm   10 cm Proximal to Ulnar Styloid Process 22 cm   Just Proximal to Ulnar Styloid Process 17.4 cm   Across Hand at [employer] 18.8 cm   At [place] of 2nd Digit 6 cm   At [facility] of Thumb 6.3 cm     Left Upper Extremity Lymphedema   15 cm Proximal to Olecranon Process 34 cm   10 cm Proximal to Olecranon Process 33.4 cm    Olecranon Process 30 cm   15 cm Proximal to Ulnar Styloid Process 31.1 cm   10 cm Proximal to Ulnar Styloid Process 28 cm   Just Proximal to Ulnar Styloid Process 19 cm   Across Hand at [employer] 18.8 cm   At [place] of 2nd Digit 6 cm   At [facility] of Thumb 6 cm     Assessed Pt's RM reidsleeve  - pt do not have impressions in am anymore - had it longer than 5 yrs  and Jobst elvarex sleeve at least  Since 2015 -  Pt to get new compression sleeves from [place] me if received them                   OT Education - 08/19/17 2055    Education provided Yes   Education Details new compression garments - contact me if they come in    Person(s) Educated Patient   Methods Explanation;Demonstration;Tactile cues;Verbal cues   Comprehension Returned demonstration;Verbalized understanding;Verbal cues required             OT Long Term Goals - 08/19/17 2110      OT LONG TERM GOAL #1   Title Pt to be ind in wearing of new daytime and night time compression garments to keep lymphedema in L UE under control    Baseline garments more than 3-5 yrs old - did not replace    Time 4   Period Weeks   Status New   Target Date 09/16/17     OT LONG TERM GOAL #2   Title Reassess scar tissue and fibrosis of L breast to decrease discomfort    Baseline 07/26/17 partial mastectomy - necrosis fat tissue    Time 4   Period Weeks   Status New   Target Date 09/16/17               Plan - 08/19/17 2056    Clinical Impression Statement Pt present at OT eval with diagnosis of  stage 2 L UE lymphedema- pt had breast CA in 2010 - was seen by this OT /CLT few times during years and was wearing night time Reidsleeve , daytime Jobst elvarex sof and glove - was seen last time in end of 2015 - night time compression is more than 80 yrs old , do not have glove anymore and daytime  compression is older than 2015 - Pt L UE is still increase compare to R by 6 cm at forearm, 3.5 cm at  elbow  and 1.6cm at wrist , - upper arm are increase by 5.9 - was in 2015 3 cm -  pt had partial mastectomy on 07/26/17 for fat necrosis tissue - scar still thick and some fibrosis  - will reasses upper arm circumference and scar tissue when  pt come in with new compression garments to be reassess    Occupational performance deficits (Please refer to evaluation for details): ADL's;IADL's   Rehab Potential Good   OT Frequency 1x / week   OT Duration 4 weeks   OT Treatment/Interventions Patient/family education;Self-care/ADL training;Scar mobilization;Manual lymph drainage   Clinical Decision Making Limited treatment options, no task modification necessary   Consulted and Agree with Plan of Care Patient      Patient will benefit from skilled therapeutic intervention in order to improve the following deficits and impairments:  Increased edema, Decreased scar mobility, Impaired UE functional use  Visit Diagnosis: Postmastectomy lymphedema syndrome - Plan: Ot plan of care cert/re-cert    Problem List Patient Active Problem List   Diagnosis Date Noted  . ST elevation myocardial infarction (STEMI) of inferior wall ([place]) 06/08/2016  . ST elevation myocardial infarction involving right coronary artery ([place])   . Fat necrosis (segmental) of breast 07/26/2015  . Squamous cell carcinoma in situ 05/25/2015  . Lymphedema 08/05/2014  . Personal history of malignant neoplasm of breast 05/25/2014  . Breast cancer, stage 2 ([place]) 04/08/2013    Rosalyn Gess  OTR/L,CLT 08/19/2017, 9:24 PM  [facility] [address], [place], [address] Phone: [phone]   Fax:  [phone]  Name: Hailey Ellison MRN: [id number] Date of Birth: 04-Aug-1935

## 2017-08-19 NOTE — Patient Instructions (Addendum)
Get new compression garments from [facility]  Daytime Jobs Elvarex soft  And glove  New Night time compression garment -

## 2017-08-28 ENCOUNTER — Ambulatory Visit: Payer: Medicare Other | Attending: General Surgery | Admitting: Occupational Therapy

## 2017-09-02 ENCOUNTER — Other Ambulatory Visit: Payer: Self-pay | Admitting: *Deleted

## 2017-09-02 ENCOUNTER — Encounter: Payer: Self-pay | Admitting: Hematology and Oncology

## 2017-09-02 ENCOUNTER — Inpatient Hospital Stay (HOSPITAL_BASED_OUTPATIENT_CLINIC_OR_DEPARTMENT_OTHER): Payer: Medicare Other | Admitting: Hematology and Oncology

## 2017-09-02 ENCOUNTER — Inpatient Hospital Stay: Payer: Medicare Other | Attending: Hematology and Oncology

## 2017-09-02 VITALS — BP 122/91 | HR 74 | Temp 97.5°F | Wt 186.9 lb

## 2017-09-02 DIAGNOSIS — Z9012 Acquired absence of left breast and nipple: Secondary | ICD-10-CM

## 2017-09-02 DIAGNOSIS — Z923 Personal history of irradiation: Secondary | ICD-10-CM | POA: Insufficient documentation

## 2017-09-02 DIAGNOSIS — Z9221 Personal history of antineoplastic chemotherapy: Secondary | ICD-10-CM | POA: Insufficient documentation

## 2017-09-02 DIAGNOSIS — I129 Hypertensive chronic kidney disease with stage 1 through stage 4 chronic kidney disease, or unspecified chronic kidney disease: Secondary | ICD-10-CM | POA: Insufficient documentation

## 2017-09-02 DIAGNOSIS — C50912 Malignant neoplasm of unspecified site of left female breast: Secondary | ICD-10-CM

## 2017-09-02 DIAGNOSIS — Z8673 Personal history of transient ischemic attack (TIA), and cerebral infarction without residual deficits: Secondary | ICD-10-CM | POA: Diagnosis not present

## 2017-09-02 DIAGNOSIS — I890 Lymphedema, not elsewhere classified: Secondary | ICD-10-CM | POA: Diagnosis not present

## 2017-09-02 DIAGNOSIS — Z803 Family history of malignant neoplasm of breast: Secondary | ICD-10-CM | POA: Insufficient documentation

## 2017-09-02 DIAGNOSIS — N2889 Other specified disorders of kidney and ureter: Secondary | ICD-10-CM | POA: Insufficient documentation

## 2017-09-02 DIAGNOSIS — I972 Postmastectomy lymphedema syndrome: Secondary | ICD-10-CM | POA: Insufficient documentation

## 2017-09-02 DIAGNOSIS — K219 Gastro-esophageal reflux disease without esophagitis: Secondary | ICD-10-CM

## 2017-09-02 DIAGNOSIS — Z87891 Personal history of nicotine dependence: Secondary | ICD-10-CM

## 2017-09-02 DIAGNOSIS — N641 Fat necrosis of breast: Secondary | ICD-10-CM

## 2017-09-02 DIAGNOSIS — M8580 Other specified disorders of bone density and structure, unspecified site: Secondary | ICD-10-CM

## 2017-09-02 DIAGNOSIS — Z79811 Long term (current) use of aromatase inhibitors: Secondary | ICD-10-CM | POA: Diagnosis not present

## 2017-09-02 DIAGNOSIS — Z7982 Long term (current) use of aspirin: Secondary | ICD-10-CM | POA: Insufficient documentation

## 2017-09-02 DIAGNOSIS — N189 Chronic kidney disease, unspecified: Secondary | ICD-10-CM | POA: Insufficient documentation

## 2017-09-02 DIAGNOSIS — Z170 Estrogen receptor positive status [ER+]: Secondary | ICD-10-CM | POA: Diagnosis not present

## 2017-09-02 DIAGNOSIS — Z853 Personal history of malignant neoplasm of breast: Secondary | ICD-10-CM

## 2017-09-02 DIAGNOSIS — Z79899 Other long term (current) drug therapy: Secondary | ICD-10-CM | POA: Insufficient documentation

## 2017-09-02 DIAGNOSIS — I252 Old myocardial infarction: Secondary | ICD-10-CM | POA: Diagnosis not present

## 2017-09-02 LAB — COMPREHENSIVE METABOLIC PANEL
ALT: 21 U/L (ref 14–54)
AST: 28 U/L (ref 15–41)
Albumin: 4 g/dL (ref 3.5–5.0)
Alkaline Phosphatase: 82 U/L (ref 38–126)
Anion gap: 8 (ref 5–15)
BUN: 31 mg/dL — ABNORMAL HIGH (ref 6–20)
CO2: 26 mmol/L (ref 22–32)
Calcium: 9.8 mg/dL (ref 8.9–10.3)
Chloride: 105 mmol/L (ref 101–111)
Creatinine, Ser: 1.67 mg/dL — ABNORMAL HIGH (ref 0.44–1.00)
GFR calc Af Amer: 33 mL/min — ABNORMAL LOW (ref >60)
GFR calc non Af Amer: 28 mL/min — ABNORMAL LOW (ref >60)
Glucose, Bld: 98 mg/dL (ref 65–99)
Potassium: 4.2 mmol/L (ref 3.5–5.1)
Sodium: 139 mmol/L (ref 135–145)
Total Bilirubin: 0.8 mg/dL (ref 0.3–1.2)
Total Protein: 6.5 g/dL (ref 6.5–8.1)

## 2017-09-02 LAB — CBC WITH DIFFERENTIAL/PLATELET
Basophils Absolute: 0 10*3/uL (ref 0–0.1)
Basophils Relative: 1 %
Eosinophils Absolute: 0.1 10*3/uL (ref 0–0.7)
Eosinophils Relative: 3 %
HCT: 35.3 % (ref 35.0–47.0)
Hemoglobin: 12.6 g/dL (ref 12.0–16.0)
Lymphocytes Relative: 30 %
Lymphs Abs: 1.5 10*3/uL (ref 1.0–3.6)
MCH: 33.5 pg (ref 26.0–34.0)
MCHC: 35.6 g/dL (ref 32.0–36.0)
MCV: 94 fL (ref 80.0–100.0)
Monocytes Absolute: 0.3 10*3/uL (ref 0.2–0.9)
Monocytes Relative: 6 %
Neutro Abs: 3.1 10*3/uL (ref 1.4–6.5)
Neutrophils Relative %: 60 %
Platelets: 158 10*3/uL (ref 150–440)
RBC: 3.75 MIL/uL — ABNORMAL LOW (ref 3.80–5.20)
RDW: 12.4 % (ref 11.5–14.5)
WBC: 5 10*3/uL (ref 3.6–11.0)

## 2017-09-02 NOTE — Progress Notes (Signed)
Patient had a chronic abscess removed from left chest on 07/26/17 by Dr. Bary Castilla.

## 2017-09-02 NOTE — Progress Notes (Signed)
[facility] day:  09/02/2017  Chief Complaint: Hailey Ellison is a 82 y.o. female with stage II left breast cancer who is seen for 1 year assessment.  HPI: The patient was last seen in the medical oncology clinic on 08/31/2016.  At that time, she was seen for initial assessment.  She had issues with lymphedema.  She denied any breast concerns.  She was referred to the lymphedema clinic.  CBC, CMP, and CA27.29 were normal except for a creatinine of 1.62.  Bilateral mammogram on 05/08/2017 revealed no evidence of malignancy in either breast.   Left breast ultrasound on 05/08/2017 revealed lumpectomy changes in the superior aspect of the left breast with fat necrosis.  There was no focal fluid collection. There was minimal drainage from the lumpectomy scar.    She underwent left partial mastectomy on 07/26/2017 by Dr. Hervey Ard.  Pathology revealed organized fat necrosis with calcification.  She reports that she developed an abscess that did not require treatment with antibiotics. She is schedule to see Dr. Bary Castilla again in 1-2 weeks.   During the interim, she has done well. She has been followed by the lymphedema clinic.  She has a sleeve in place to LEFT upper extremity.  She has new one on order. She denies other physical complaints today. Patient is taking her Arimidex.  She is unsure when she last received Prolia (unclear if received on 12/28/2016 at the [facility]).   Past Medical History:  Diagnosis Date  . Anxiety   . Arthritis   . Breast cancer ([place]) 2010   Adjuvant chemotherapy with Taxotere/Carboplatin/Herceptin. Arimidex initiated November 2011  . Cancer ([place]) 2010   T1c,N2a, M0; ER 90%, PR 60%,  HER-2/neu 2+ IHC, equivocal on fish. 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  Margins clear.  . Chronic kidney  disease 2014  . Depression   . GERD (gastroesophageal reflux disease)   . Heart attack ([place]) 06/09/2016  . Hypertension   . Lymphedema    left arm  . Murmur   . Osteoporosis   . Radiation 2010   with chemo  . Squamous cell carcinoma June 2016   Left upper inner chest wall, area of previous radiation.  . Stroke ([place])    per pt, TIA a long time ago.  . Tinnitus     Past Surgical History:  Procedure Laterality Date  . ABDOMINAL HYSTERECTOMY    . BREAST BIOPSY Left 2010   positive  . BREAST BIOPSY Right    neg  . BREAST CYST EXCISION    . BREAST LUMPECTOMY Left 2010  . CARDIAC CATHETERIZATION N/A 06/08/2016   Procedure: Left Heart Cath and Coronary Angiography;  Surgeon: Wellington Hampshire, MD;  Location: [place] [facility];  Service: Cardiovascular;  Laterality: N/A;  . CARDIAC CATHETERIZATION N/A 06/08/2016   Procedure: Coronary Stent Intervention;  Surgeon: Wellington Hampshire, MD;  Location: [place] [facility];  Service: Cardiovascular;  Laterality: N/A;  . CARPAL TUNNEL RELEASE Bilateral   . CHOLECYSTECTOMY    . JOINT REPLACEMENT Left 2008   Total Knee Replacement  . MASTECTOMY, PARTIAL Left 07/26/2017   Procedure: MASTECTOMY PARTIAL;  Surgeon: Robert Bellow, MD;  Location: [facility];  Service: General;  Laterality: Left;  . PARTIAL HYSTERECTOMY  1980's  . PORT-A-CATH REMOVAL      Family History  Problem Relation Age of Onset  . Cancer Sister        breast  . Cancer Sister        kidney  . Hypertension Mother   . Hypertension Father     Social History:  reports that she has quit smoking. Her smoking use included Cigarettes. She has never used smokeless tobacco. She reports that she does not drink alcohol or use drugs.  She lives in [place].  The patient is alone today.  Allergies:  Allergies  Allergen Reactions  . Alendronate Shortness Of Breath  . Benadryl [Diphenhydramine Hcl] Hives  . Tegaderm Ag Mesh [Silver]     tegaderm causes rash  . Neosporin  [Neomycin-Bacitracin Zn-Polymyx] Rash    Tolerates Polysporin(bacitracin zinc - polymyxin B)  . Sulfa Antibiotics Rash  . Tape Rash    Tape and bandaids    Current Medications: Current Outpatient Prescriptions  Medication Sig Dispense Refill  . anastrozole (ARIMIDEX) 1 MG tablet Take 1 tablet (1 mg total) by mouth daily. 90 tablet 1  . aspirin 81 MG chewable tablet Chew 1 tablet (81 mg total) by mouth daily. 30 tablet 2  . atorvastatin (LIPITOR) 80 MG tablet Take 80 mg by mouth at bedtime.    . calcium citrate-vitamin D (CITRACAL+D) 315-200 MG-UNIT per tablet Take 1 tablet by mouth 2 (two) times daily.    . clopidogrel (PLAVIX) 75 MG tablet Take 1 tablet (75 mg total) by mouth daily. 1 tablet 0  . ergocalciferol (VITAMIN D2) 50000 units capsule Take 50,000 Units by mouth every 30 (thirty) days.     . furosemide (LASIX) 20 MG tablet Take 20 mg by mouth daily as needed for fluid.     Marland Kitchen HYDROcodone-acetaminophen (NORCO) 5-325 MG tablet Take 1 tablet by mouth every 4 (four) hours as needed for moderate pain. 20 tablet 0  . lansoprazole (PREVACID) 30 MG capsule Take 30 mg by mouth daily.    Marland Kitchen losartan (COZAAR) 50 MG tablet Take 50 mg by mouth daily.    . Multiple Vitamin (MULTIVITAMIN WITH MINERALS) TABS Take 1 tablet by mouth daily.    Marland Kitchen nystatin cream (MYCOSTATIN) Apply 1 application topically 2 (two) times daily.    Marland Kitchen PARoxetine (PAXIL) 40 MG tablet Take 40 mg by mouth at bedtime.     . vitamin B-12 (CYANOCOBALAMIN) 500 MCG tablet Take 500 mcg by mouth daily.     No current facility-administered medications for this visit.     Review of Systems:  GENERAL:  Feels "good".  No fevers or sweats.  No weight loss. PERFORMANCE STATUS (ECOG):  1 HEENT:  No visual changes, runny nose, sore throat, mouth sores or tenderness. Lungs: No shortness of breath or cough.  No hemoptysis. Cardiac:  No chest pain, palpitations, orthopnea, or PND. GI:  No nausea, vomiting, diarrhea, constipation, melena  or hematochezia. GU:  No urgency, frequency, dysuria, or hematuria. Musculoskeletal:  No back pain.  No joint pain.  No muscle tenderness. Extremities:  No pain or swelling. Skin:  No rahses. Neuro:  No headache, numbness or weakness, balance or coordination issues. Endocrine:  No diabetes, thyroid issues, hot flashes or night sweats. Psych:  No mood changes, depression or anxiety. Pain:  No focal pain. Review of systems:  All other systems reviewed and found to be negative.  Physical Exam: Blood pressure (!) 122/91, pulse 74, temperature (!) 97.5 F (36.4 C), temperature source Tympanic, weight 186 lb 14.4 oz (84.8 kg). GENERAL:  Well developed, well nourished,  woman sitting comfortably in the exam room in no acute distress. MENTAL STATUS:  Alert and oriented to person, place and time. HEAD:  Short gray hair.  Normocephalic, atraumatic, face symmetric, no Cushingoid features. EYES:  Glasses.  Blue eyes.  Pupils equal round and reactive to light and accomodation.  No conjunctivitis or scleral icterus. ENT:  Oropharynx clear without lesion.  Tongue normal. Mucous membranes moist.  RESPIRATORY:  Clear to auscultation without rales, wheezes or rhonchi. CARDIOVASCULAR:  Regular rate and rhythm without murmur, rub or gallop. BREAST:  Right breast without masses, skin changes or nipple discharge.  Fibrocystic changes.  Left breast with new well healed horizontal incision.  No discrete masses, skin changes or nipple discharge.  ABDOMEN:  Soft, non-tender, with active bowel sounds, and no hepatosplenomegaly.  No masses. SKIN:  No rashes, ulcers or lesions.  Several small flat hyperpigmented lesions s/p hives. EXTREMITIES: Wearing a left upper extremity lymphedema sleeve.  No lower extremity edema, no skin discoloration or tenderness.  No palpable cords. LYMPH NODES: No palpable cervical, supraclavicular, axillary or inguinal adenopathy  NEUROLOGICAL: Unremarkable. PSYCH:   Appropriate.   Appointment on 09/02/2017  Component Date Value Ref Range Status  . WBC 09/02/2017 5.0  3.6 - 11.0 K/uL Final  . RBC 09/02/2017 3.75* 3.80 - 5.20 MIL/uL Final  . Hemoglobin 09/02/2017 12.6  12.0 - 16.0 g/dL Final  . HCT 09/02/2017 35.3  35.0 - 47.0 % Final  . MCV 09/02/2017 94.0  80.0 - 100.0 fL Final  . MCH 09/02/2017 33.5  26.0 - 34.0 pg Final  . MCHC 09/02/2017 35.6  32.0 - 36.0 g/dL Final  . RDW 09/02/2017 12.4  11.5 - 14.5 % Final  . Platelets 09/02/2017 158  150 - 440 K/uL Final  . Neutrophils Relative % 09/02/2017 60  % Final  . Neutro Abs 09/02/2017 3.1  1.4 - 6.5 K/uL Final  . Lymphocytes Relative 09/02/2017 30  % Final  . Lymphs Abs 09/02/2017 1.5  1.0 - 3.6 K/uL Final  . Monocytes Relative 09/02/2017 6  % Final  . Monocytes Absolute 09/02/2017 0.3  0.2 - 0.9 K/uL Final  . Eosinophils Relative 09/02/2017 3  % Final  . Eosinophils Absolute 09/02/2017 0.1  0 - 0.7 K/uL Final  . Basophils Relative 09/02/2017 1  % Final  . Basophils Absolute 09/02/2017 0.0  0 - 0.1 K/uL Final  . Sodium 09/02/2017 139  135 - 145 mmol/L Final  . Potassium 09/02/2017 4.2  3.5 - 5.1 mmol/L Final  . Chloride 09/02/2017 105  101 - 111 mmol/L Final  . CO2 09/02/2017 26  22 - 32 mmol/L Final  . Glucose, Bld 09/02/2017 98  65 - 99 mg/dL Final  . BUN 09/02/2017 31* 6 - 20 mg/dL Final  . Creatinine, Ser 09/02/2017 1.67* 0.44 - 1.00 mg/dL Final  . Calcium 09/02/2017 9.8  8.9 - 10.3 mg/dL Final  . Total Protein 09/02/2017 6.5  6.5 - 8.1 g/dL Final  . Albumin 09/02/2017 4.0  3.5 - 5.0 g/dL Final  . AST 09/02/2017 28  15 - 41 U/L Final  . ALT 09/02/2017 21  14 - 54 U/L Final  . Alkaline Phosphatase 09/02/2017 82  38 - 126 U/L Final  . Total Bilirubin 09/02/2017 0.8  0.3 - 1.2 mg/dL Final  . GFR calc non Af Amer 09/02/2017 28* >60 mL/min Final  . GFR calc Af Amer 09/02/2017 33* >60 mL/min Final   Comment: (NOTE) The eGFR has been calculated using the CKD  EPI equation. This calculation  has not been validated in all clinical situations. eGFR's persistently <60 mL/min signify possible Chronic Kidney Disease.   . Anion gap 09/02/2017 8  5 - 15 Final    Assessment:  Hailey Ellison is a 82 y.o. female with stage II left breast cancer s/p wide excision on 03/09/2009.  Pathology revealed a 1.2 cm grade III invasive ductal carcinoma with micropapillary features.  One of 2 sentinel lymph nodes and 5 of 13 axillary lymph nodes were positive.  The size of the largest metastatic deposit was 1.2 cm There was lymph-vascular invasion. Estrogen receptor was positive (greater than 90%), progesterone receptor positive (60%) and HER-2/neu 2+ by IHC and equivocal by FISH.  She was treated with 6 cycles of [place] followed by a year of adjuvant Herceptin (last 05/01/2010).  She received radiation following completion of [place].  She began Arimidex in 07/2010.  Bilateral mammogram on 05/08/2017 revealed no evidence of malignancy in either breast.  Left breast ultrasound on 05/08/2017 revealed lumpectomy changes in the superior aspect of the left breast with fat necrosis.  There was no focal fluid collection. There was minimal drainage from the lumpectomy scar.  She underwent left partial mastectomy on 07/26/2017.  Pathology revealed organized fat necrosis with calcification.   CA 27-29 has been followed: 18.1 on 12/04/2013, 27.7 on 08/16/2014, 24.4 on 09/02/2015, 27.7 on 08/31/2016, and 18.5 on 09/02/2017.   Bone density study on 09/14/2015 revealed osteopenia with a T score -1.8 in the left femoral neck.  She began Prolia twice a year (last received 06/27/2016).   The patient has kidney disease. She is followed by Dr.Stoioff for a right kidney mass. He notes that since last being seen she had a myocardial infarction in 05/2016. She had cellulitis in 08/2016.   Symptomatically, she has chronic lymphedema. She is wearing a sleeve.  She denies any breast concerns. BUN is 31 and creatinine 1.67.  Plan: 1.   Labs today:  CBC with diff, CMP, CA27.29. 2.  Discuss interval mammogram and partial mastectomy.  Pathology revealed fat necrosis. 3.  Discussed BCI testing. Patient advising that "they wouldn't allow the testing". We will need to find out why the testing was denied; will have Olivia Mackie, RN call when she returns.   4.  Schedule done density testing around 09/13/2017.  5.  Discuss dental clearance for Prolia; call [facility] to find out when last dose was given.  6.  Continue Arimidex. 7.  RTC in 1 year for MD assessment and labs (CBC with diff, CMP, CA27.29)   Honor Loh, NP  09/02/2017, 2:09 PM   I saw and evaluated the patient, participating in the key portions of the service and reviewing pertinent diagnostic studies and records.  I reviewed the nurse practitioner's note and agree with the findings and the plan.  The assessment and plan were discussed with the patient.  A few questions were asked by the patient and answered.   Lequita Asal, MD 09/02/2017,5:13 PM

## 2017-09-02 NOTE — Patient Instructions (Signed)

## 2017-09-03 LAB — CANCER ANTIGEN 27.29: CA 27.29: 18.5 U/mL (ref 0.0–38.6)

## 2017-09-05 ENCOUNTER — Ambulatory Visit: Payer: Medicare Other | Admitting: General Surgery

## 2017-09-07 DIAGNOSIS — M8580 Other specified disorders of bone density and structure, unspecified site: Secondary | ICD-10-CM | POA: Insufficient documentation

## 2017-09-09 ENCOUNTER — Telehealth: Payer: Self-pay | Admitting: *Deleted

## 2017-09-09 NOTE — Telephone Encounter (Signed)
Called patient and LVM to inquire if patient is taking Actonel.  Advised her that I will call back later today to verify.

## 2017-09-09 NOTE — Telephone Encounter (Signed)
-----   Message from Karen Kitchens, NP sent at 09/08/2017 11:54 AM EDT ----- Regarding: RE: Logan Bores Waiting for a call back, but this is what I found in epic.  -Last administered on 06/27/16, so it would have been due in January.   -12/31/2016 endo note indicates insurance will no longer cover. Recommended Forteo injections. She developed urticaria with Fosamax. The last thing I saw was she was starting Actonel in January.   -She has not been seen by endo since 12/2016. Recall, she told us that Dr. Graceann Congress left the practice. She has only been seen by internal medicine, but I did not see any mention of Prolia. They have her taking Actonel.  Gaspar Bidding ----- Message ----- From: Lequita Asal, MD Sent: 09/07/2017   5:14 PM To: Karen Kitchens, NP, Shirlean Kelly, RN Subject: Logan Bores                                         Any f/u with the [facility] on last time Prolia given?  Awaiting dental clearance.  Orders in.  M

## 2017-09-11 ENCOUNTER — Other Ambulatory Visit: Payer: Self-pay | Admitting: Hematology and Oncology

## 2017-09-11 ENCOUNTER — Telehealth: Payer: Self-pay | Admitting: *Deleted

## 2017-09-11 NOTE — Telephone Encounter (Signed)
Patient called back this morning to report that after thinking about our question to her regarding whether or not she is taking actonel,  she remembers that she did take it for a short period but had problems with her mouth and stopped it.

## 2017-09-17 ENCOUNTER — Ambulatory Visit (INDEPENDENT_AMBULATORY_CARE_PROVIDER_SITE_OTHER): Payer: Medicare Other | Admitting: General Surgery

## 2017-09-17 ENCOUNTER — Encounter: Payer: Self-pay | Admitting: General Surgery

## 2017-09-17 VITALS — BP 130/60 | HR 64 | Resp 14 | Ht 67.0 in | Wt 188.0 lb

## 2017-09-17 DIAGNOSIS — N641 Fat necrosis of breast: Secondary | ICD-10-CM

## 2017-09-17 NOTE — Progress Notes (Signed)
Patient ID: Hailey Ellison, female   DOB: [birth year], 82 y.o.   MRN: [id number]  Chief Complaint  Patient presents with  . Follow-up    HPI Hailey Ellison is a 82 y.o. female  here for her one month follow up  left breast wide excision done on 07/26/17. She reports that she is doing well.   HPI  Past Medical History:  Diagnosis Date  . Anxiety   . Arthritis   . Breast cancer ([place]) 2010   Adjuvant chemotherapy with Taxotere/Carboplatin/Herceptin. Arimidex initiated November 2011  . Cancer ([place]) 2010   T1c,N2a, M0; ER 90%, PR 60%,  HER-2/neu 2+ IHC, equivocal on fish. 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  Margins clear.  . Chronic kidney disease 2014  . Depression   . GERD (gastroesophageal reflux disease)   . Heart attack ([place]) 06/09/2016  . Hypertension   . Lymphedema    left arm  . Murmur   . Osteoporosis   . Radiation 2010   with chemo  . Squamous cell carcinoma June 2016   Left upper inner chest wall, area of previous radiation.  . Stroke ([place])    per pt, TIA a long time ago.  . Tinnitus     Past Surgical History:  Procedure Laterality Date  . ABDOMINAL HYSTERECTOMY    . BREAST BIOPSY Left 2010   positive  . BREAST BIOPSY Right    neg  . BREAST CYST EXCISION    . BREAST LUMPECTOMY Left 2010  . CARDIAC CATHETERIZATION N/A 06/08/2016   Procedure: Left Heart Cath and Coronary Angiography;  Surgeon: Wellington Hampshire, MD;  Location: [place] [facility];  Service: Cardiovascular;  Laterality: N/A;  . CARDIAC CATHETERIZATION N/A 06/08/2016   Procedure: Coronary Stent Intervention;  Surgeon: Wellington Hampshire, MD;  Location: [place] [facility];  Service: Cardiovascular;  Laterality: N/A;  . CARPAL TUNNEL RELEASE Bilateral   . CHOLECYSTECTOMY    . JOINT REPLACEMENT Left 2008   Total Knee Replacement  . MASTECTOMY, PARTIAL Left 07/26/2017   Procedure: MASTECTOMY PARTIAL;  Surgeon: Robert Bellow, MD;  Location: [facility];  Service: General;  Laterality: Left;  . PARTIAL HYSTERECTOMY  1980's  . PORT-A-CATH REMOVAL      Family History  Problem Relation Age of Onset  . Cancer Sister        breast  . Cancer Sister        kidney  . Hypertension Mother   . Hypertension Father     Social History Social History  Substance Use Topics  . Smoking status: Former Smoker    Types: Cigarettes  . Smokeless tobacco: Never Used     Comment: only smoked a few cigs throughout her lifetime  . Alcohol use No    Allergies  Allergen Reactions  . Alendronate Shortness Of Breath  . Benadryl [Diphenhydramine Hcl] Hives  . Tegaderm Ag Mesh [Silver]     tegaderm causes rash  . Neosporin [Neomycin-Bacitracin Zn-Polymyx] Rash    Tolerates Polysporin(bacitracin zinc - polymyxin B)  . Sulfa Antibiotics Rash  . Tape Rash    Tape and bandaids    Current Outpatient Prescriptions  Medication Sig Dispense Refill  . anastrozole (ARIMIDEX) 1 MG tablet Take 1 tablet (1 mg total) by mouth daily. 90 tablet 1  . aspirin 81 MG chewable tablet Chew 1 tablet (81 mg total) by mouth  daily. 30 tablet 2  . atorvastatin (LIPITOR) 80 MG tablet Take 80 mg by mouth at bedtime.    . calcium citrate-vitamin D (CITRACAL+D) 315-200 MG-UNIT per tablet Take 1 tablet by mouth 2 (two) times daily.    . clopidogrel (PLAVIX) 75 MG tablet Take 1 tablet (75 mg total) by mouth daily. 1 tablet 0  . ergocalciferol (VITAMIN D2) 50000 units capsule Take 50,000 Units by mouth every 30 (thirty) days.     . furosemide (LASIX) 20 MG tablet Take 20 mg by mouth daily as needed for fluid.     Marland Kitchen HYDROcodone-acetaminophen (NORCO) 5-325 MG tablet Take 1 tablet by mouth every 4 (four) hours as needed for moderate pain. 20 tablet 0  . lansoprazole (PREVACID) 30 MG capsule Take 30 mg by mouth daily.    Marland Kitchen losartan (COZAAR) 50 MG tablet Take 50 mg by mouth daily.    . Multiple Vitamin  (MULTIVITAMIN WITH MINERALS) TABS Take 1 tablet by mouth daily.    Marland Kitchen nystatin cream (MYCOSTATIN) Apply 1 application topically 2 (two) times daily.    Marland Kitchen PARoxetine (PAXIL) 40 MG tablet Take 40 mg by mouth at bedtime.     . vitamin B-12 (CYANOCOBALAMIN) 500 MCG tablet Take 500 mcg by mouth daily.     No current facility-administered medications for this visit.     Review of Systems Review of Systems  Constitutional: Negative.   Respiratory: Negative.   Cardiovascular: Negative.     Blood pressure 130/60, pulse 64, resp. rate 14, height '5\' 7"'$  (1.702 m), weight 188 lb (85.3 kg).  Physical Exam Physical Exam  Pulmonary/Chest:      Data Reviewed DIAGNOSIS:  A. BREAST, LEFT; WIDE EXCISION:  - ORGANIZED FAT NECROSIS WITH CALCIFICATION.  - NEGATIVE FOR MALIGNANCY.    Assessment    Doing well status post excision of an area of chronic fat necrosis.    Plan        Patient to return in two months. The patient is aware to call back for any questions or concerns.   HPI, Physical Exam, Assessment and Plan have been scribed under the direction and in the presence of Hervey Ard, MD.  Gaspar Cola, CMA   Robert Bellow 09/18/2017, 7:43 PM

## 2017-09-17 NOTE — Patient Instructions (Signed)
Patient to return in two months. The patient is aware to call back for any questions or concerns. 

## 2017-10-01 ENCOUNTER — Ambulatory Visit: Payer: Medicare Other | Admitting: General Surgery

## 2017-10-07 ENCOUNTER — Ambulatory Visit: Payer: Medicare Other | Admitting: Podiatry

## 2017-10-07 ENCOUNTER — Ambulatory Visit (INDEPENDENT_AMBULATORY_CARE_PROVIDER_SITE_OTHER): Payer: Medicare Other | Admitting: Podiatry

## 2017-10-07 DIAGNOSIS — B351 Tinea unguium: Secondary | ICD-10-CM | POA: Diagnosis not present

## 2017-10-07 DIAGNOSIS — M79676 Pain in unspecified toe(s): Secondary | ICD-10-CM | POA: Diagnosis not present

## 2017-10-07 NOTE — Progress Notes (Signed)
Complaint:  Visit Type: Patient returns to my office for continued preventative foot care services. Complaint: Patient states" my nails have grown long and thick and become painful to walk and wear shoes" . The patient presents for preventative foot care services. No changes to ROS  Podiatric Exam: Vascular: dorsalis pedis and posterior tibial pulses are palpable bilateral. Capillary return is immediate. Temperature gradient is WNL. Skin turgor WNL  Sensorium: Normal Semmes Weinstein monofilament test. Normal tactile sensation bilaterally. Nail Exam: Pt has thick disfigured discolored nails with subungual debris noted bilateral entire nail hallux through fifth toenails Ulcer Exam: There is no evidence of ulcer or pre-ulcerative changes or infection. Orthopedic Exam: Muscle tone and strength are WNL. No limitations in general ROM. No crepitus or effusions noted. Foot type and digits show no abnormalities. Bony prominences are unremarkable. Skin: No Porokeratosis. No infection or ulcers  Diagnosis:  Onychomycosis, , Pain in right toe, pain in left toes  Treatment & Plan Procedures and Treatment: Consent by patient was obtained for treatment procedures. The patient understood the discussion of treatment and procedures well. All questions were answered thoroughly reviewed. Debridement of mycotic and hypertrophic toenails, 1 through 5 bilateral and clearing of subungual debris. No ulceration, no infection noted.  Return Visit-Office Procedure: Patient instructed to return to the office for a follow up visit 3 months   for continued evaluation and treatment.    [place] DPM 

## 2017-10-08 ENCOUNTER — Other Ambulatory Visit: Payer: Medicare Other

## 2017-11-06 ENCOUNTER — Other Ambulatory Visit: Payer: Medicare Other

## 2017-11-11 ENCOUNTER — Other Ambulatory Visit: Payer: Medicare Other

## 2017-11-13 ENCOUNTER — Ambulatory Visit: Payer: Medicare Other | Attending: General Surgery | Admitting: Occupational Therapy

## 2017-11-13 DIAGNOSIS — I972 Postmastectomy lymphedema syndrome: Secondary | ICD-10-CM

## 2017-11-13 NOTE — Therapy (Signed)
[facility] [address], [place], [address] Phone: [phone]   Fax:  [phone]  Occupational Therapy screen   Patient Details  Name: Hailey Ellison MRN: [id number] Date of Birth: September 24, 1935 Referring Provider: Bary Castilla   Encounter Date: 11/13/2017    Past Medical History:  Diagnosis Date  . Anxiety   . Arthritis   . Breast cancer ([place]) 2010   Adjuvant chemotherapy with Taxotere/Carboplatin/Herceptin. Arimidex initiated November 2011  . Cancer ([place]) 2010   T1c,N2a, M0; ER 90%, PR 60%,  HER-2/neu 2+ IHC, equivocal on fish. 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  Margins clear.  . Chronic kidney disease 2014  . Depression   . GERD (gastroesophageal reflux disease)   . Heart attack ([place]) 06/09/2016  . Hypertension   . Lymphedema    left arm  . Murmur   . Osteoporosis   . Radiation 2010   with chemo  . Squamous cell carcinoma June 2016   Left upper inner chest wall, area of previous radiation.  . Stroke ([place])    per pt, TIA a long time ago.  . Tinnitus     Past Surgical History:  Procedure Laterality Date  . ABDOMINAL HYSTERECTOMY    . BREAST BIOPSY Left 2010   positive  . BREAST BIOPSY Right    neg  . BREAST CYST EXCISION    . BREAST LUMPECTOMY Left 2010  . CARDIAC CATHETERIZATION N/A 06/08/2016   Procedure: Left Heart Cath and Coronary Angiography;  Surgeon: Wellington Hampshire, MD;  Location: [facility];  Service: Cardiovascular;  Laterality: N/A;  . CARDIAC CATHETERIZATION N/A 06/08/2016   Procedure: Coronary Stent Intervention;  Surgeon: Wellington Hampshire, MD;  Location: [facility];  Service: Cardiovascular;  Laterality: N/A;  . CARPAL TUNNEL RELEASE Bilateral   . CHOLECYSTECTOMY    . JOINT REPLACEMENT Left 2008   Total Knee Replacement  .  MASTECTOMY, PARTIAL Left 07/26/2017   Procedure: MASTECTOMY PARTIAL;  Surgeon: Robert Bellow, MD;  Location: [facility];  Service: General;  Laterality: Left;  . PARTIAL HYSTERECTOMY  1980's  . PORT-A-CATH REMOVAL      There were no vitals filed for this visit.  Subjective Assessment - 11/13/17 1441    Subjective   They measured me for my compression glove about in Sept - just got it this yesterday - but I think it is to short and to tight at my fingers and around hand    Patient Stated Goals  I do need a new compression glove and also night time compression - do not want my arm to get worse     Currently in Pain?  No/denies          Pt has her compression sleeve on - and new glove - appear to loose at wrist - and with sleeve and glove over lap and pt moving - glove wrist cuff works down  Causing some pocket of lymphedema Tips of digits change color per pt after few min  Pt to wash glove and see if cuff with go tighter  - and stretch digits with markers while drying  - per Reps recommendations in past  And will reassess fit of glove in week   Her measurement in digits with  .5 cm decrease  Hand was .5 down , And wrist was  increase 1.5 cm  Compare to last time seen prior to glove                       OT Long Term Goals - 08/19/17 2110      OT LONG TERM GOAL #1   Title  Pt to be ind in wearing of new daytime and night time compression garments to keep lymphedema in L UE under control     Baseline  garments more than 3-5 yrs old - did not replace     Time  4    Period  Weeks    Status  New    Target Date  09/16/17      OT LONG TERM GOAL #2   Title  Reassess scar tissue and fibrosis of L breast to decrease discomfort     Baseline  07/26/17 partial mastectomy - necrosis fat tissue     Time  4    Period  Weeks    Status  New    Target Date  09/16/17              Patient will benefit from skilled therapeutic intervention in order to improve the  following deficits and impairments:     Visit Diagnosis: Postmastectomy lymphedema syndrome    Problem List Patient Active Problem List   Diagnosis Date Noted  . Osteopenia 09/07/2017  . ST elevation myocardial infarction (STEMI) of inferior wall ([place]) 06/08/2016  . ST elevation myocardial infarction involving right coronary artery ([place])   . Fat necrosis (segmental) of breast 07/26/2015  . Squamous cell carcinoma in situ 05/25/2015  . Lymphedema 08/05/2014  . Personal history of malignant neoplasm of breast 05/25/2014  . Breast cancer, stage 2 ([place]) 04/08/2013    Rosalyn Gess OTR/L,CLT 11/13/2017, 2:43 PM  [facility] [address], [place], [address] Phone: [phone]   Fax:  [phone]  Name: Hailey Ellison MRN: [id number] Date of Birth: 1935/08/03

## 2017-11-18 ENCOUNTER — Encounter: Payer: Self-pay | Admitting: General Surgery

## 2017-11-18 ENCOUNTER — Ambulatory Visit (INDEPENDENT_AMBULATORY_CARE_PROVIDER_SITE_OTHER): Payer: Medicare Other | Admitting: General Surgery

## 2017-11-18 VITALS — BP 140/80 | HR 72 | Resp 14 | Ht 64.0 in | Wt 187.0 lb

## 2017-11-18 DIAGNOSIS — N641 Fat necrosis of breast: Secondary | ICD-10-CM

## 2017-11-18 NOTE — Progress Notes (Signed)
Patient ID: Hailey Ellison, female   DOB: 07-21-1935, 82 y.o.   MRN: [id number]  Chief Complaint  Patient presents with  . Other    HPI Hailey Ellison is a 82 y.o. female here today for her follow up left breast wide excision done on 07/26/2017. Patient states she is doing well. Patient states she fell on 11/16/2017 and hit the tub.  HPI  Past Medical History:  Diagnosis Date  . Anxiety   . Arthritis   . Breast cancer ([facility]) 2010   Adjuvant chemotherapy with Taxotere/Carboplatin/Herceptin. Arimidex initiated November 2011  . Cancer ([facility]) 2010   T1c,N2a, M0; ER 90%, PR 60%,  HER-2/neu 2+ IHC, equivocal on fish. 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  Margins clear.  . Chronic kidney disease 2014  . Depression   . GERD (gastroesophageal reflux disease)   . Heart attack ([facility]) 06/09/2016  . Hypertension   . Lymphedema    left arm  . Murmur   . Osteoporosis   . Radiation 2010   with chemo  . Squamous cell carcinoma June 2016   Left upper inner chest wall, area of previous radiation.  . Stroke ([place])    per pt, TIA a long time ago.  . Tinnitus     Past Surgical History:  Procedure Laterality Date  . ABDOMINAL HYSTERECTOMY    . BREAST BIOPSY Left 2010   positive  . BREAST BIOPSY Right    neg  . BREAST CYST EXCISION    . BREAST LUMPECTOMY Left 2010  . CARDIAC CATHETERIZATION N/A 06/08/2016   Procedure: Left Heart Cath and Coronary Angiography;  Surgeon: Wellington Hampshire, MD;  Location: [facility];  Service: Cardiovascular;  Laterality: N/A;  . CARDIAC CATHETERIZATION N/A 06/08/2016   Procedure: Coronary Stent Intervention;  Surgeon: Wellington Hampshire, MD;  Location: [facility];  Service: Cardiovascular;  Laterality: N/A;  . CARPAL TUNNEL RELEASE Bilateral   . CHOLECYSTECTOMY    . JOINT REPLACEMENT Left 2008   Total Knee Replacement  .  MASTECTOMY, PARTIAL Left 07/26/2017   Procedure: MASTECTOMY PARTIAL;  Surgeon: Robert Bellow, MD;  Location: [facility];  Service: General;  Laterality: Left;  . PARTIAL HYSTERECTOMY  1980's  . PORT-A-CATH REMOVAL      Family History  Problem Relation Age of Onset  . Cancer Sister        breast  . Cancer Sister        kidney  . Hypertension Mother   . Hypertension Father     Social History Social History   Tobacco Use  . Smoking status: Former Smoker    Types: Cigarettes  . Smokeless tobacco: Never Used  . Tobacco comment: only smoked a few cigs throughout her lifetime  Substance Use Topics  . Alcohol use: No    Alcohol/week: 0.0 oz  . Drug use: No    Allergies  Allergen Reactions  . Alendronate Shortness Of Breath  . Benadryl [Diphenhydramine Hcl] Hives  . Tegaderm Ag Mesh [Silver]     tegaderm causes rash  . Neosporin [Neomycin-Bacitracin Zn-Polymyx] Rash    Tolerates Polysporin(bacitracin zinc - polymyxin B)  . Sulfa Antibiotics Rash  . Tape Rash    Tape and bandaids    Current Outpatient Medications  Medication Sig Dispense Refill  . anastrozole (ARIMIDEX) 1 MG tablet Take 1 tablet (1 mg total) by  mouth daily. 90 tablet 1  . aspirin 81 MG chewable tablet Chew 1 tablet (81 mg total) by mouth daily. 30 tablet 2  . atorvastatin (LIPITOR) 80 MG tablet Take 80 mg by mouth at bedtime.    . calcium citrate-vitamin D (CITRACAL+D) 315-200 MG-UNIT per tablet Take 1 tablet by mouth 2 (two) times daily.    . clopidogrel (PLAVIX) 75 MG tablet Take 1 tablet (75 mg total) by mouth daily. 1 tablet 0  . ergocalciferol (VITAMIN D2) 50000 units capsule Take 50,000 Units by mouth every 30 (thirty) days.     . furosemide (LASIX) 20 MG tablet Take 20 mg by mouth daily as needed for fluid.     Marland Kitchen HYDROcodone-acetaminophen (NORCO) 5-325 MG tablet Take 1 tablet by mouth every 4 (four) hours as needed for moderate pain. 20 tablet 0  . lansoprazole (PREVACID) 30 MG capsule Take 30 mg by  mouth daily.    Marland Kitchen losartan (COZAAR) 50 MG tablet Take 50 mg by mouth daily.    . Multiple Vitamin (MULTIVITAMIN WITH MINERALS) TABS Take 1 tablet by mouth daily.    Marland Kitchen nystatin cream (MYCOSTATIN) Apply 1 application topically 2 (two) times daily.    Marland Kitchen PARoxetine (PAXIL) 40 MG tablet Take 40 mg by mouth at bedtime.     . vitamin B-12 (CYANOCOBALAMIN) 500 MCG tablet Take 500 mcg by mouth daily.     No current facility-administered medications for this visit.     Review of Systems Review of Systems  Constitutional: Negative.   Respiratory: Negative.   Cardiovascular: Negative.     Blood pressure 140/80, pulse 72, resp. rate 14, height _0  (1.626 m), weight 187 lb (84.8 kg).  Physical Exam Physical Exam  Constitutional: She is oriented to person, place, and time. She appears well-developed and well-nourished.  Eyes: Conjunctivae are normal. No scleral icterus.  Neck: Neck supple.  Cardiovascular: Normal rate, regular rhythm and normal heart sounds.  Pulmonary/Chest: Effort normal and breath sounds normal. Right breast exhibits no inverted nipple, no mass, no nipple discharge, no skin change and no tenderness. Left breast exhibits no inverted nipple, no mass, no nipple discharge, no skin change and no tenderness.    Lymphadenopathy:    She has no cervical adenopathy.    She has no axillary adenopathy.  Neurological: She is alert and oriented to person, place, and time.  Skin: Skin is warm and dry.       Assessment    oing well status post excision of fat necrosis from the left breast.    Plan         Patient to return in six months bilateral diagnotic mammogram The patient is aware to call back for any questions or concerns.  HPI, Physical Exam, Assessment and Plan have been scribed under the direction and in the presence of Hervey Ard, MD.  Hailey Ellison, CMA  I have completed the exam and reviewed the above documentation for accuracy and completeness.  I agree  with the above.  Haematologist has been used and any errors in dictation or transcription are unintentional.  Hervey Ard, M.D., F.A.C.S.  Robert Bellow 11/18/2017, 8:32 PM

## 2017-11-18 NOTE — Patient Instructions (Signed)
Patient to return in six months bilateral diagnotic mammogram The patient is aware to call back for any questions or concerns.

## 2018-01-09 ENCOUNTER — Ambulatory Visit: Payer: Medicare Other | Admitting: Podiatry

## 2018-01-18 ENCOUNTER — Other Ambulatory Visit: Payer: Self-pay | Admitting: General Surgery

## 2018-03-14 ENCOUNTER — Other Ambulatory Visit: Payer: Self-pay

## 2018-03-14 DIAGNOSIS — Z853 Personal history of malignant neoplasm of breast: Secondary | ICD-10-CM

## 2018-03-14 DIAGNOSIS — N641 Fat necrosis of breast: Secondary | ICD-10-CM

## 2018-04-24 ENCOUNTER — Ambulatory Visit (INDEPENDENT_AMBULATORY_CARE_PROVIDER_SITE_OTHER): Payer: Medicare Other | Admitting: Podiatry

## 2018-04-24 ENCOUNTER — Encounter: Payer: Self-pay | Admitting: Podiatry

## 2018-04-24 DIAGNOSIS — D689 Coagulation defect, unspecified: Secondary | ICD-10-CM

## 2018-04-24 DIAGNOSIS — B351 Tinea unguium: Secondary | ICD-10-CM

## 2018-04-24 DIAGNOSIS — M79676 Pain in unspecified toe(s): Secondary | ICD-10-CM

## 2018-04-24 NOTE — Progress Notes (Addendum)
Complaint:  Visit Type: Patient returns to my office for continued preventative foot care services. Complaint: Patient states" my nails have grown long and thick and become painful to walk and wear shoes"  The patient presents for preventative foot care services. No changes to ROS.  Patient is taking plavix.  Podiatric Exam: Vascular: dorsalis pedis and posterior tibial pulses are palpable bilateral. Capillary return is immediate. Temperature gradient is WNL. Skin turgor WNL  Sensorium: Normal Semmes Weinstein monofilament test. Normal tactile sensation bilaterally. Nail Exam: Pt has thick disfigured discolored nails with subungual debris noted bilateral entire nail hallux through fifth toenails Ulcer Exam: There is no evidence of ulcer or pre-ulcerative changes or infection. Orthopedic Exam: Muscle tone and strength are WNL. No limitations in general ROM. No crepitus or effusions noted. Foot type and digits show no abnormalities. Bony prominences are unremarkable. Skin: No Porokeratosis. No infection or ulcers  Diagnosis:  Onychomycosis, , Pain in right toe, pain in left toes  Treatment & Plan Procedures and Treatment: Consent by patient was obtained for treatment procedures. The patient understood the discussion of treatment and procedures well. All questions were answered thoroughly reviewed. Debridement of mycotic and hypertrophic toenails, 1 through 5 bilateral and clearing of subungual debris. No ulceration, no infection noted. ABN signed for 2019. Return Visit-Office Procedure: Patient instructed to return to the office for a follow up visit 3 months for continued evaluation and treatment.    Jaquail Mclees DPM 

## 2018-05-09 ENCOUNTER — Other Ambulatory Visit: Payer: Medicare Other

## 2018-05-09 ENCOUNTER — Ambulatory Visit
Admission: RE | Admit: 2018-05-09 | Discharge: 2018-05-09 | Disposition: A | Discharge status: Home / Self Care | Payer: Medicare Other | Source: Ambulatory Visit | Attending: General Surgery | Admitting: General Surgery

## 2018-05-09 DIAGNOSIS — N641 Fat necrosis of breast: Secondary | ICD-10-CM | POA: Insufficient documentation

## 2018-05-09 DIAGNOSIS — Z853 Personal history of malignant neoplasm of breast: Secondary | ICD-10-CM

## 2018-05-09 HISTORY — DX: Personal history of antineoplastic chemotherapy: Z92.21

## 2018-05-15 ENCOUNTER — Encounter: Payer: Self-pay | Admitting: General Surgery

## 2018-05-15 ENCOUNTER — Ambulatory Visit (INDEPENDENT_AMBULATORY_CARE_PROVIDER_SITE_OTHER): Payer: Medicare Other | Admitting: General Surgery

## 2018-05-15 VITALS — BP 150/72 | HR 71 | Resp 14 | Ht 64.0 in | Wt 179.0 lb

## 2018-05-15 DIAGNOSIS — C50912 Malignant neoplasm of unspecified site of left female breast: Secondary | ICD-10-CM

## 2018-05-15 NOTE — Patient Instructions (Addendum)
The patient is aware to call back for any questions or concerns. Patient will be asked to return to the office in one year with a bilateral screening mammogram. 

## 2018-05-15 NOTE — Progress Notes (Addendum)
Patient ID: Hailey Ellison, female   DOB: Apr 24, 1935, 83 y.o.   MRN: [id number]  Chief Complaint  Patient presents with  . Follow-up    HPI Hailey Ellison is a 83 y.o. female. who presents for her follow up breast cancer and a breast evaluation. Left breast wide excision done on 07/26/2017  The most recent mammogram was done on 05-09-18.  Patient does perform regular self breast checks and gets regular mammograms done.   Wearing her left lymphedema sleeve. Tolerating the Arimidex.  HPI  Past Medical History:  Diagnosis Date  . Anxiety   . Arthritis   . Breast cancer ([facility]) 2010   Adjuvant chemotherapy with Taxotere/Carboplatin/Herceptin. Arimidex initiated November 2011  . Cancer ([facility]) 2010   T1c,N2a, M0; ER 90%, PR 60%,  HER-2/neu 2+ IHC, equivocal on fish. 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  Margins clear.  . Chronic kidney disease 2014  . Depression   . GERD (gastroesophageal reflux disease)   . Heart attack ([place]) 06/09/2016  . Hypertension   . Lymphedema    left arm  . Murmur   . Osteoporosis   . Personal history of chemotherapy   . Radiation 2010   with chemo  . Squamous cell carcinoma June 2016   Left upper inner chest wall, area of previous radiation.  . Stroke ([place])    per pt, TIA a long time ago.  . Tinnitus     Past Surgical History:  Procedure Laterality Date  . ABDOMINAL HYSTERECTOMY    . BREAST BIOPSY Left 2010   positive  . BREAST BIOPSY Right    neg  . BREAST CYST EXCISION    . BREAST EXCISIONAL BIOPSY Left 07/26/2017   neg/fat necrosis  . BREAST LUMPECTOMY Left 2010  . CARDIAC CATHETERIZATION N/A 06/08/2016   Procedure: Left Heart Cath and Coronary Angiography;  Surgeon: Wellington Hampshire, MD;  Location: [facility];  Service: Cardiovascular;  Laterality: N/A;  . CARDIAC CATHETERIZATION N/A 06/08/2016   Procedure:  Coronary Stent Intervention;  Surgeon: Wellington Hampshire, MD;  Location: [facility];  Service: Cardiovascular;  Laterality: N/A;  . CARPAL TUNNEL RELEASE Bilateral   . CHOLECYSTECTOMY    . JOINT REPLACEMENT Left 2008   Total Knee Replacement  . MASTECTOMY, PARTIAL Left 07/26/2017   Procedure: MASTECTOMY PARTIAL;  Surgeon: Robert Bellow, MD;  Location: [facility];  Service: General;  Laterality: Left;  . PARTIAL HYSTERECTOMY  1980's  . PORT-A-CATH REMOVAL      Family History  Problem Relation Age of Onset  . Cancer Sister        breast  . Cancer Sister        kidney  . Hypertension Mother   . Hypertension Father     Social History Social History   Tobacco Use  . Smoking status: Former Smoker    Types: Cigarettes  . Smokeless tobacco: Never Used  . Tobacco comment: only smoked a few cigs throughout her lifetime  Substance Use Topics  . Alcohol use: No    Alcohol/week: 0.0 oz  . Drug use: No    Allergies  Allergen Reactions  . Alendronate Shortness Of Breath  . Benadryl [Diphenhydramine Hcl] Hives  . Tegaderm Ag Mesh [Silver]     tegaderm causes rash  . Neosporin [Neomycin-Bacitracin Zn-Polymyx] Rash    Tolerates Polysporin(bacitracin zinc - polymyxin B)  .  Sulfa Antibiotics Rash  . Tape Rash    Tape and bandaids    Current Outpatient Medications  Medication Sig Dispense Refill  . anastrozole (ARIMIDEX) 1 MG tablet TAKE 1 TABLET DAILY 90 tablet 4  . aspirin 81 MG chewable tablet Chew 1 tablet (81 mg total) by mouth daily. 30 tablet 2  . atorvastatin (LIPITOR) 80 MG tablet Take 80 mg by mouth at bedtime.    . calcium citrate-vitamin D (CITRACAL+D) 315-200 MG-UNIT per tablet Take 1 tablet by mouth 2 (two) times daily.    . clopidogrel (PLAVIX) 75 MG tablet Take 1 tablet (75 mg total) by mouth daily. 1 tablet 0  . ergocalciferol (VITAMIN D2) 50000 units capsule Take 50,000 Units by mouth every 30 (thirty) days.     . furosemide (LASIX) 20 MG tablet Take 20  mg by mouth daily as needed for fluid.     Marland Kitchen HYDROcodone-acetaminophen (NORCO) 5-325 MG tablet Take 1 tablet by mouth every 4 (four) hours as needed for moderate pain. 20 tablet 0  . lansoprazole (PREVACID) 30 MG capsule Take 30 mg by mouth daily.    Marland Kitchen losartan (COZAAR) 50 MG tablet Take 50 mg by mouth daily.    . Multiple Vitamin (MULTIVITAMIN WITH MINERALS) TABS Take 1 tablet by mouth daily.    Marland Kitchen nystatin cream (MYCOSTATIN) Apply 1 application topically 2 (two) times daily.    Marland Kitchen PARoxetine (PAXIL) 40 MG tablet Take 40 mg by mouth at bedtime.     . vitamin B-12 (CYANOCOBALAMIN) 500 MCG tablet Take 500 mcg by mouth daily.     No current facility-administered medications for this visit.     Review of Systems Review of Systems  Constitutional: Negative.   Respiratory: Negative.   Cardiovascular: Negative.     Blood pressure (!) 150/72, pulse 71, resp. rate 14, height '5\' 4"'$  (1.626 m), weight 179 lb (81.2 kg).  Physical Exam Physical Exam  Constitutional: She is oriented to person, place, and time. She appears well-developed and well-nourished.  HENT:  Mouth/Throat: Oropharynx is clear and moist.  Eyes: Conjunctivae are normal. No scleral icterus.  Neck: Neck supple.  Cardiovascular: Normal rate and regular rhythm.  Murmur heard.  Systolic murmur is present with a grade of 5/6. Pulmonary/Chest: Effort normal and breath sounds normal. Right breast exhibits no inverted nipple, no mass, no nipple discharge, no skin change and no tenderness. Left breast exhibits no inverted nipple, no mass, no nipple discharge, no skin change and no tenderness.  Lymphedema of the right upper extremity well controlled with sleeve.    Lymphadenopathy:    She has no cervical adenopathy.    She has no axillary adenopathy.  Neurological: She is alert and oriented to person, place, and time.  Skin: Skin is warm and dry.  Psychiatric: Her behavior is normal.    Data Reviewed Bilateral diagnostic  mammograms dated May 08, 2017 were reviewed.  Postsurgical changes on the left.  BI-RADS-2.  Screening exam recommended for next year.  Assessment    Doing well status post excision of fat necrosis from the right breast.  No evidence of recurrent squamous cell carcinoma in the treatment field of the left breast.    Plan    Patient will be asked to return to the office in one year with a bilateral screening mammogram.      HPI, Physical Exam, Assessment and Plan have been scribed under the direction and in the presence of Robert Bellow, MD. Karie Fetch, RN  I have completed the exam and reviewed the above documentation for accuracy and completeness.  I agree with the above.  Haematologist has been used and any errors in dictation or transcription are unintentional.  Hervey Ard, M.D., F.A.C.S.  Hailey Ellison 05/16/2018, 6:13 PM

## 2018-07-31 ENCOUNTER — Encounter: Payer: Self-pay | Admitting: Podiatry

## 2018-07-31 ENCOUNTER — Ambulatory Visit (INDEPENDENT_AMBULATORY_CARE_PROVIDER_SITE_OTHER): Payer: Medicare Other | Admitting: Podiatry

## 2018-07-31 DIAGNOSIS — B351 Tinea unguium: Secondary | ICD-10-CM | POA: Diagnosis not present

## 2018-07-31 DIAGNOSIS — M79676 Pain in unspecified toe(s): Secondary | ICD-10-CM

## 2018-07-31 DIAGNOSIS — D689 Coagulation defect, unspecified: Secondary | ICD-10-CM

## 2018-07-31 NOTE — Progress Notes (Signed)
Complaint:  Visit Type: Patient returns to my office for continued preventative foot care services. Complaint: Patient states" my nails have grown long and thick and become painful to walk and wear shoes"  The patient presents for preventative foot care services. No changes to ROS.  Patient is taking plavix.  Podiatric Exam: Vascular: dorsalis pedis and posterior tibial pulses are palpable bilateral. Capillary return is immediate. Temperature gradient is WNL. Skin turgor WNL  Sensorium: Normal Semmes Weinstein monofilament test. Normal tactile sensation bilaterally. Nail Exam: Pt has thick disfigured discolored nails with subungual debris noted bilateral entire nail hallux through fifth toenails Ulcer Exam: There is no evidence of ulcer or pre-ulcerative changes or infection. Orthopedic Exam: Muscle tone and strength are WNL. No limitations in general ROM. No crepitus or effusions noted. Foot type and digits show no abnormalities. Bony prominences are unremarkable. Skin: No Porokeratosis. No infection or ulcers  Diagnosis:  Onychomycosis, , Pain in right toe, pain in left toes  Treatment & Plan Procedures and Treatment: Consent by patient was obtained for treatment procedures. The patient understood the discussion of treatment and procedures well. All questions were answered thoroughly reviewed. Debridement of mycotic and hypertrophic toenails, 1 through 5 bilateral and clearing of subungual debris. No ulceration, no infection noted. ABN signed for 2019. Return Visit-Office Procedure: Patient instructed to return to the office for a follow up visit 3 months for continued evaluation and treatment.    Rehaan Viloria DPM 

## 2018-09-01 ENCOUNTER — Encounter: Payer: Self-pay | Admitting: Hematology and Oncology

## 2018-09-01 ENCOUNTER — Inpatient Hospital Stay (HOSPITAL_BASED_OUTPATIENT_CLINIC_OR_DEPARTMENT_OTHER): Payer: Medicare Other | Admitting: Hematology and Oncology

## 2018-09-01 ENCOUNTER — Inpatient Hospital Stay: Payer: Medicare Other | Attending: Hematology and Oncology

## 2018-09-01 VITALS — BP 144/81 | HR 80 | Temp 99.1°F | Resp 18 | Wt 181.4 lb

## 2018-09-01 DIAGNOSIS — M8580 Other specified disorders of bone density and structure, unspecified site: Secondary | ICD-10-CM

## 2018-09-01 DIAGNOSIS — Z853 Personal history of malignant neoplasm of breast: Secondary | ICD-10-CM

## 2018-09-01 DIAGNOSIS — C50912 Malignant neoplasm of unspecified site of left female breast: Secondary | ICD-10-CM | POA: Insufficient documentation

## 2018-09-01 LAB — COMPREHENSIVE METABOLIC PANEL
ALT: 22 U/L (ref 0–44)
AST: 28 U/L (ref 15–41)
Albumin: 4.2 g/dL (ref 3.5–5.0)
Alkaline Phosphatase: 91 U/L (ref 38–126)
Anion gap: 7 (ref 5–15)
BUN: 31 mg/dL — ABNORMAL HIGH (ref 8–23)
CO2: 24 mmol/L (ref 22–32)
Calcium: 9.4 mg/dL (ref 8.9–10.3)
Chloride: 109 mmol/L (ref 98–111)
Creatinine, Ser: 1.7 mg/dL — ABNORMAL HIGH (ref 0.44–1.00)
GFR calc Af Amer: 32 mL/min — ABNORMAL LOW (ref >60)
GFR calc non Af Amer: 27 mL/min — ABNORMAL LOW (ref >60)
Glucose, Bld: 102 mg/dL — ABNORMAL HIGH (ref 70–99)
Potassium: 4.4 mmol/L (ref 3.5–5.1)
Sodium: 140 mmol/L (ref 135–145)
Total Bilirubin: 0.9 mg/dL (ref 0.3–1.2)
Total Protein: 6.6 g/dL (ref 6.5–8.1)

## 2018-09-01 LAB — CBC WITH DIFFERENTIAL/PLATELET
Basophils Absolute: 0 10*3/uL (ref 0–0.1)
Basophils Relative: 1 %
Eosinophils Absolute: 0.1 10*3/uL (ref 0–0.7)
Eosinophils Relative: 3 %
HCT: 36.5 % (ref 35.0–47.0)
Hemoglobin: 12.7 g/dL (ref 12.0–16.0)
Lymphocytes Relative: 30 %
Lymphs Abs: 1.5 10*3/uL (ref 1.0–3.6)
MCH: 33.2 pg (ref 26.0–34.0)
MCHC: 34.8 g/dL (ref 32.0–36.0)
MCV: 95.2 fL (ref 80.0–100.0)
Monocytes Absolute: 0.3 10*3/uL (ref 0.2–0.9)
Monocytes Relative: 6 %
Neutro Abs: 3 10*3/uL (ref 1.4–6.5)
Neutrophils Relative %: 62 %
Platelets: 148 10*3/uL — ABNORMAL LOW (ref 150–440)
RBC: 3.83 MIL/uL (ref 3.80–5.20)
RDW: 13.1 % (ref 11.5–14.5)
WBC: 4.9 10*3/uL (ref 3.6–11.0)

## 2018-09-01 NOTE — Progress Notes (Signed)
[name] day:  09/01/2018  Chief Complaint: Hailey Ellison is a 83 y.o. female with stage II left breast cancer who is seen for 1 year assessment.  HPI: The patient was last seen in the medical oncology clinic on 09/02/2017.  At that time, she denied any breast concerns.  She had chronic lymphedema. She was wearing a sleeve.  CA27.29 was normal.  BUN was 31 and creatinine 1.67.  She continued Arimidex.  Bilateral mammogram on 05/09/2018 revealed no mammographic evidence of malignancy in either breast s/p left lumpectomy.  She saw Dr. Bary Castilla on 05/15/2018.  Notes reviewed.  Exam revealed extensive telangectasias in the left axillary region. She was tolerating Arimidex well.    Bone density was ordered, but not performed.  During the interim, she has felt "pretty good".  She has not been quite as active.  She is wearing her lymphedema sleeve.  She denies any breast concerns.  She notes some right hip pain.  Weight has been up and down.   Past Medical History:  Diagnosis Date  . Anxiety   . Arthritis   . Breast cancer ([place]) 2010   Adjuvant chemotherapy with Taxotere/Carboplatin/Herceptin. Arimidex initiated November 2011  . Cancer ([place]) 2010   T1c,N2a, M0; ER 90%, PR 60%,  HER-2/neu 2+ IHC, equivocal on fish. 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  Margins clear.  . Chronic kidney disease 2014  . Depression   . GERD (gastroesophageal reflux disease)   . Heart attack ([place]) 06/09/2016  . Hypertension   . Lymphedema    left arm  . Murmur   . Osteoporosis   . Personal history of chemotherapy   . Radiation 2010   with chemo  . Squamous cell carcinoma June 2016   Left upper inner chest wall, area of previous radiation.  . Stroke ([place])    per pt, TIA a long time ago.  . Tinnitus     Past Surgical History:   Procedure Laterality Date  . ABDOMINAL HYSTERECTOMY    . BREAST BIOPSY Left 2010   positive  . BREAST BIOPSY Right    neg  . BREAST CYST EXCISION    . BREAST EXCISIONAL BIOPSY Left 07/26/2017   neg/fat necrosis  . BREAST LUMPECTOMY Left 2010  . CARDIAC CATHETERIZATION N/A 06/08/2016   Procedure: Left Heart Cath and Coronary Angiography;  Surgeon: Wellington Hampshire, MD;  Location: [facility];  Service: Cardiovascular;  Laterality: N/A;  . CARDIAC CATHETERIZATION N/A 06/08/2016   Procedure: Coronary Stent Intervention;  Surgeon: Wellington Hampshire, MD;  Location: [facility];  Service: Cardiovascular;  Laterality: N/A;  . CARPAL TUNNEL RELEASE Bilateral   . CHOLECYSTECTOMY    . JOINT REPLACEMENT Left 2008   Total Knee Replacement  . MASTECTOMY, PARTIAL Left 07/26/2017   Procedure: MASTECTOMY PARTIAL;  Surgeon: Robert Bellow, MD;  Location: [facility];  Service: General;  Laterality: Left;  . PARTIAL HYSTERECTOMY  1980's  . PORT-A-CATH REMOVAL      Family History  Problem Relation Age of Onset  . Cancer Sister        breast  . Cancer Sister        kidney  . Hypertension Mother   . Hypertension Father     Social History:  reports that she has quit smoking. Her smoking use included cigarettes.  She has never used smokeless tobacco. She reports that she does not drink alcohol or use drugs.  She lives in [place].  The patient is alone today.  Allergies:  Allergies  Allergen Reactions  . Alendronate Shortness Of Breath  . Benadryl [Diphenhydramine Hcl] Hives  . Tegaderm Ag Mesh [Silver]     tegaderm causes rash  . Neosporin [Neomycin-Bacitracin Zn-Polymyx] Rash    Tolerates Polysporin(bacitracin zinc - polymyxin B)  . Sulfa Antibiotics Rash  . Tape Rash    Tape and bandaids    Current Medications: Current Outpatient Medications  Medication Sig Dispense Refill  . anastrozole (ARIMIDEX) 1 MG tablet TAKE 1 TABLET DAILY 90 tablet 4  . aspirin 81 MG chewable  tablet Chew 1 tablet (81 mg total) by mouth daily. 30 tablet 2  . atorvastatin (LIPITOR) 80 MG tablet Take 80 mg by mouth at bedtime.    . calcium citrate-vitamin D (CITRACAL+D) 315-200 MG-UNIT per tablet Take 1 tablet by mouth 2 (two) times daily.    . clopidogrel (PLAVIX) 75 MG tablet Take 1 tablet (75 mg total) by mouth daily. 1 tablet 0  . ergocalciferol (VITAMIN D2) 50000 units capsule Take 50,000 Units by mouth every 30 (thirty) days.     . furosemide (LASIX) 20 MG tablet Take 20 mg by mouth daily as needed for fluid.     Marland Kitchen HYDROcodone-acetaminophen (NORCO) 5-325 MG tablet Take 1 tablet by mouth every 4 (four) hours as needed for moderate pain. 20 tablet 0  . lansoprazole (PREVACID) 30 MG capsule Take 30 mg by mouth daily.    Marland Kitchen losartan (COZAAR) 50 MG tablet Take 50 mg by mouth daily.    . Multiple Vitamin (MULTIVITAMIN WITH MINERALS) TABS Take 1 tablet by mouth daily.    Marland Kitchen nystatin cream (MYCOSTATIN) Apply 1 application topically 2 (two) times daily.    Marland Kitchen PARoxetine (PAXIL) 40 MG tablet Take 40 mg by mouth at bedtime.     . vitamin B-12 (CYANOCOBALAMIN) 500 MCG tablet Take 500 mcg by mouth daily.     No current facility-administered medications for this visit.     Review of Systems:  GENERAL:  Feels "pretty good".  Not quite as active.  No fevers, sweats.  Weight up and down (down 5 pounds since last visit). PERFORMANCE STATUS (ECOG):  1 HEENT:  No visual changes, runny nose, sore throat, mouth sores or tenderness. Lungs: No shortness of breath or cough.  No hemoptysis. Cardiac:  No chest pain, palpitations, orthopnea, or PND. GI:  No nausea, vomiting, diarrhea, constipation, melena or hematochezia. GU:  No urgency, frequency, dysuria, or hematuria. Musculoskeletal:  No back pain.  Right hip pain.  No muscle tenderness. Extremities:  No pain or swelling. Skin:  No rashes or skin changes. Neuro:  No headache, numbness or weakness, balance or coordination issues. Endocrine:  No  diabetes, thyroid issues, hot flashes or night sweats. Psych:  No mood changes, depression or anxiety. Pain:  No focal pain. Review of systems:  All other systems reviewed and found to be negative.   Physical Exam: Blood pressure (!) 144/81, pulse 80, temperature 99.1 F (37.3 C), resp. rate 18, weight 181 lb 6 oz (82.3 kg). GENERAL:  Well developed, well nourished, woman sitting comfortably in the exam room in no acute distress.  She needs assistance onto the exam table. MENTAL STATUS:  Alert and oriented to person, place and time. HEAD:  Short gray hair.  Normocephalic, atraumatic, face symmetric, no Cushingoid features. EYES:  Glasses.  Blue eyes.  Pupils equal round and reactive to light and accomodation.  No conjunctivitis or scleral icterus. ENT:  Oropharynx clear without lesion.  Tongue normal. Mucous membranes moist.  RESPIRATORY:  Clear to auscultation without rales, wheezes or rhonchi. CARDIOVASCULAR:  Regular rate and rhythm without murmur, rub or gallop. BREAST:  Right breast without masses, skin changes or nipple discharge.  Left breast with significant scarring in the upper quadrant.  Telangectasias. No nipple discharge. ABDOMEN:  Soft, non-tender, with active bowel sounds, and no hepatosplenomegaly.  No masses. SKIN:  No rashes, ulcers or lesions. EXTREMITIES: Left upper extremity lymphedema sleeve.  No lower extremity edema, no skin discoloration or tenderness.  No palpable cords. LYMPH NODES: No palpable cervical, supraclavicular, axillary or inguinal adenopathy  NEUROLOGICAL: Unremarkable. PSYCH:  Appropriate.    Appointment on 09/01/2018  Component Date Value Ref Range Status  . Sodium 09/01/2018 140  135 - 145 mmol/L Final  . Potassium 09/01/2018 4.4  3.5 - 5.1 mmol/L Final  . Chloride 09/01/2018 109  98 - 111 mmol/L Final  . CO2 09/01/2018 24  22 - 32 mmol/L Final  . Glucose, Bld 09/01/2018 102* 70 - 99 mg/dL Final  . BUN 09/01/2018 31* 8 - 23 mg/dL Final  .  Creatinine, Ser 09/01/2018 1.70* 0.44 - 1.00 mg/dL Final  . Calcium 09/01/2018 9.4  8.9 - 10.3 mg/dL Final  . Total Protein 09/01/2018 6.6  6.5 - 8.1 g/dL Final  . Albumin 09/01/2018 4.2  3.5 - 5.0 g/dL Final  . AST 09/01/2018 28  15 - 41 U/L Final  . ALT 09/01/2018 22  0 - 44 U/L Final  . Alkaline Phosphatase 09/01/2018 91  38 - 126 U/L Final  . Total Bilirubin 09/01/2018 0.9  0.3 - 1.2 mg/dL Final  . GFR calc non Af Amer 09/01/2018 27* >60 mL/min Final  . GFR calc Af Amer 09/01/2018 32* >60 mL/min Final   Comment: (NOTE) The eGFR has been calculated using the CKD EPI equation. This calculation has not been validated in all clinical situations. eGFR's persistently <60 mL/min signify possible Chronic Kidney Disease.   Georgiann Hahn gap 09/01/2018 7  5 - 15 Final   Performed at [facility], [address] [place], [place] [address]  . WBC 09/01/2018 4.9  3.6 - 11.0 K/uL Final  . RBC 09/01/2018 3.83  3.80 - 5.20 MIL/uL Final  . Hemoglobin 09/01/2018 12.7  12.0 - 16.0 g/dL Final  . HCT 09/01/2018 36.5  35.0 - 47.0 % Final  . MCV 09/01/2018 95.2  80.0 - 100.0 fL Final  . MCH 09/01/2018 33.2  26.0 - 34.0 pg Final  . MCHC 09/01/2018 34.8  32.0 - 36.0 g/dL Final  . RDW 09/01/2018 13.1  11.5 - 14.5 % Final  . Platelets 09/01/2018 148* 150 - 440 K/uL Final  . Neutrophils Relative % 09/01/2018 62  % Final  . Neutro Abs 09/01/2018 3.0  1.4 - 6.5 K/uL Final  . Lymphocytes Relative 09/01/2018 30  % Final  . Lymphs Abs 09/01/2018 1.5  1.0 - 3.6 K/uL Final  . Monocytes Relative 09/01/2018 6  % Final  . Monocytes Absolute 09/01/2018 0.3  0.2 - 0.9 K/uL Final  . Eosinophils Relative 09/01/2018 3  % Final  . Eosinophils Absolute 09/01/2018 0.1  0 - 0.7 K/uL Final  . Basophils Relative 09/01/2018 1  % Final  . Basophils Absolute 09/01/2018 0.0  0 - 0.1 K/uL Final   Performed at [facility], [place]  [address], [place], [place] [address]    Assessment:  Hailey Ellison is a 83 y.o. female  with stage II left breast cancer s/p wide excision on 03/09/2009.  Pathology revealed a 1.2 cm grade III invasive ductal carcinoma with micropapillary features.  One of 2 sentinel lymph nodes and 5 of 13 axillary lymph nodes were positive.  The size of the largest metastatic deposit was 1.2 cm There was lymph-vascular invasion. Estrogen receptor was positive (greater than 90%), progesterone receptor positive (60%) and HER-2/neu 2+ by IHC and equivocal by FISH.  She was treated with 6 cycles of [place] followed by a year of adjuvant Herceptin (last 05/01/2010).  She received radiation following completion of [place].  She began Arimidex in 07/2010.  Bilateral mammogram on 05/08/2017 revealed no evidence of malignancy in either breast.  Left breast ultrasound on 05/08/2017 revealed lumpectomy changes in the superior aspect of the left breast with fat necrosis.  There was no focal fluid collection. There was minimal drainage from the lumpectomy scar.  She underwent left partial mastectomy on 07/26/2017.  Pathology revealed organized fat necrosis with calcification.  Bilateral mammogram on 05/09/2018 revealed no mammographic evidence of malignancy in either breast s/p left lumpectomy.  CA 27-29 has been followed: 18.1 on 12/04/2013, 27.7 on 08/16/2014, 24.4 on 09/02/2015, 27.7 on 08/31/2016, 18.5 on 09/02/2017, and 21.2 on 09/01/2018.   Bone density study on 09/14/2015 revealed osteopenia with a T score -1.8 in the left femoral neck.  She began Prolia twice a year (last received 06/27/2016).   The patient has kidney disease. She is followed by Dr.Stoioff for a right kidney mass. He notes that since last being seen she had a myocardial infarction in 05/2016. She had cellulitis in 08/2016.   Symptomatically, she denies any breast concerns.  Exam is stable.  Plan: 1.  Labs today:  CBC with diff, CMP, CA27.29. 2.  Stage II left breast cancer:  Review interval mammogram- no evidence of malignancy  Discuss inability  to perform BCI testing with plan to continue endocrine therapy x 10 years.  Continue Arimidex. 3:  Osteopenia:  Reschedule bone density.  Patient notes that she needs to go to the dentist for clearance.  Preauth Prolia. 4.  Right kidney mass:  s/p cryoablation by Dr. Jule Ser at [facility]. 5.  RTC after clearance for BMP and Prolia.  6.  RTC in 1 year for MD assessment and labs (CBC with diff, CMP, CA27.29).    Lequita Asal, MD  09/01/2018, 2:36 PM

## 2018-09-01 NOTE — Progress Notes (Signed)
Patient offers no complaints today. 

## 2018-09-02 ENCOUNTER — Other Ambulatory Visit: Payer: Self-pay | Admitting: Hematology and Oncology

## 2018-09-02 LAB — CA 27.29 (SERIAL MONITOR): CA 27.29: 21.2 U/mL (ref 0.0–38.6)

## 2018-09-25 ENCOUNTER — Ambulatory Visit
Admission: RE | Admit: 2018-09-25 | Discharge: 2018-09-25 | Disposition: A | Discharge status: Home / Self Care | Payer: Medicare Other | Source: Ambulatory Visit | Attending: Hematology and Oncology | Admitting: Hematology and Oncology

## 2018-09-25 DIAGNOSIS — Z13820 Encounter for screening for osteoporosis: Secondary | ICD-10-CM | POA: Insufficient documentation

## 2018-09-25 DIAGNOSIS — Z780 Asymptomatic menopausal state: Secondary | ICD-10-CM | POA: Diagnosis not present

## 2018-09-25 DIAGNOSIS — Z79811 Long term (current) use of aromatase inhibitors: Secondary | ICD-10-CM | POA: Diagnosis not present

## 2018-09-25 DIAGNOSIS — M8580 Other specified disorders of bone density and structure, unspecified site: Secondary | ICD-10-CM

## 2018-09-25 DIAGNOSIS — M85852 Other specified disorders of bone density and structure, left thigh: Secondary | ICD-10-CM | POA: Insufficient documentation

## 2018-09-25 DIAGNOSIS — M85831 Other specified disorders of bone density and structure, right forearm: Secondary | ICD-10-CM | POA: Insufficient documentation

## 2018-10-08 ENCOUNTER — Telehealth: Payer: Self-pay | Admitting: *Deleted

## 2018-10-08 NOTE — Telephone Encounter (Signed)
  Hailey Ellison,  Please call [facility] and find out when last Prolia given.  M

## 2018-10-08 NOTE — Telephone Encounter (Signed)
Results essentially the same. T-score -1.9 (previously -1.8) - consistent with known osteopenia. Ok to advise.

## 2018-10-08 NOTE — Telephone Encounter (Signed)
5.  Discuss dental clearance for Prolia; call [facility] to find out when last dose was given.  6.  Continue Arimidex. 7.  RTC in 1 year for MD assessment and labs (CBC with diff, CMP, CA27.29)   Lequita Asal, MD  09/01/2018, 2:36 PM

## 2018-10-08 NOTE — Telephone Encounter (Signed)
Patient asking for BMD results  EXAM: DUAL X-RAY ABSORPTIOMETRY (DXA) FOR BONE MINERAL DENSITY  IMPRESSION: Dear Dr. Lequita Asal,  Your patient Hailey Ellison completed a FRAX assessment on 09/25/2018 using the [place] (analysis version: 14.10) manufactured by [employer]. The following summarizes the results of our evaluation.  PATIENT BIOGRAPHICAL: Name: Hailey Ellison, Hailey Ellison Patient ID: [id number] Birth Date: [birth year] Height:    64.0 in. Gender:     Female    Age:        83       Weight:    181.4 lbs. Ethnicity:  White                            Exam Date: 09/25/2018  FRAX* RESULTS:  (version: 3.5) 10-year Probability of Fracture1 Major Osteoporotic Fracture2 Hip Fracture 21.4% 5.6% Population: [place] (Caucasian) Risk Factors: History of Fracture (Adult)  Based on Femur (Left) Neck BMD  1 -The 10-year probability of fracture may be lower than reported if the patient has received treatment. 2 -Major Osteoporotic Fracture: Clinical Spine, Forearm, Hip or Shoulder  *FRAX is a Materials engineer of the [employer] for Metabolic Bone Disease, a [place] (WHO) [facility].  ASSESSMENT: The probability of a major osteoporotic fracture is 21.4% within the next ten years.  The probability of a hip fracture is 5.6% within the next ten years.  .  Technologist: KBD PATIENT BIOGRAPHICAL: Name: Hailey Ellison, Hailey Ellison Patient ID: [id number] Birth Date: December 25, 1934 Height: 64.0 in. Gender: Female Exam Date: 09/25/2018 Weight: 181.4 lbs. Indications: Advanced Age, Breast CA, Caucasian, High Risk Meds, History of Fracture (Adult), Hysterectomy, Postmenopausal Fractures: Left forearm Treatments: Anastrozole  ASSESSMENT:  The BMD measured at Femur Neck Left is 0.770 g/cm2 with a T-score of -1.9. This patient is considered osteopenic according to [facility]) criteria. Lumbar spine  was not utilized due to advanced degenerative changes. Scan quality is good. Site Region Measured Measured WHO Young Adult BMD Date       Age      Classification T-score DualFemur Neck Left 09/25/2018 83 Osteopenia -1.9 0.770 g/cm2 DualFemur Neck Left 09/14/2015 77.9 Osteopenia -1.8 0.789 g/cm2 DualFemur Neck Left 06/16/2013 75.6 Osteopenia -1.3 0.863 g/cm2 DualFemur Neck Left 05/22/2011 73.6 Osteopenia -1.5 0.824 g/cm2  DualFemur Total Mean 09/25/2018 83 Osteopenia -1.2 0.856 g/cm2 DualFemur Total Mean 09/14/2015 77.9 Normal -1.0 0.888 g/cm2 DualFemur Total Mean 06/16/2013 75.6 Normal -0.5 0.946 g/cm2 DualFemur Total Mean 05/22/2011 73.6 Normal -0.7 0.915 g/cm2  Right Forearm Radius 33% 09/25/2018 83 Osteopenia -1.4 0.752 g/cm2 Right Forearm Radius 33% 09/14/2015 77.9 Normal -0.9 0.794 g/cm2 Right Forearm Radius 33% 06/16/2013 75.6 Normal -1.0 0.788 g/cm2 Right Forearm Radius 33% 05/22/2011 73.6 Normal -0.4 0.840 g/cm2  World Health Organization [facility]) criteria for post-menopausal, Caucasian Women: Normal:       T-score at or above -1 SD Osteopenia:   T-score between -1 and -2.5 SD Osteoporosis: T-score at or below -2.5 SD  RECOMMENDATIONS: 1. All patients should optimize calcium and vitamin D intake. 2. Consider FDA-approved medical therapies in postmenopausal women and men aged 37 years and older, based on the following: a. A hip or vertebral(clinical or morphometric) fracture b. T-score < -2.5 at the femoral neck or spine after appropriate evaluation to exclude secondary causes c. Low bone mass (T-score between -1.0 and -2.5 at the femoral neck or spine) and a 10-year probability of a hip fracture >  3% or a 10-year probability of a major osteoporosis-related fracture > 20% based on the [place]-adapted WHO algorithm d. Clinician judgment and/or patient preferences may indicate treatment for people with 10-year fracture probabilities above or below these  levels FOLLOW-UP: People with diagnosed cases of osteoporosis or at high risk for fracture should have regular bone mineral density tests. For patients eligible for Medicare, routine testing is allowed once every 2 years. The testing frequency can be increased to one year for patients who have rapidly progressing disease, those who are receiving or discontinuing medical therapy to restore bone mass, or have additional risk factors.  I have reviewed this report, and agree with the above findings.  [facility]   Electronically Signed   By: Lowella Grip III M.D.   On: 09/25/2018 13:39

## 2018-10-08 NOTE — Telephone Encounter (Signed)
Call returned to patient and she was given results . She is asking about the Prolia shots. Is she going to get it or not

## 2018-10-09 ENCOUNTER — Other Ambulatory Visit: Payer: Self-pay | Admitting: *Deleted

## 2018-10-09 DIAGNOSIS — C50912 Malignant neoplasm of unspecified site of left female breast: Secondary | ICD-10-CM

## 2018-10-09 NOTE — Telephone Encounter (Addendum)
It looks like 12/28/16 was her last injection

## 2018-10-10 ENCOUNTER — Telehealth: Payer: Self-pay | Admitting: *Deleted

## 2018-10-10 NOTE — Telephone Encounter (Signed)
lab/Prolia injection per Rodena Piety 10/09/18 staff message. Appts was scheduled for 10/14/2018 labs @ 1:15 and Inj. @ 1:45 I called patient and left a message on her vmail to make her aware of the date and time of the scheduled appt.

## 2018-10-14 ENCOUNTER — Inpatient Hospital Stay: Payer: Medicare Other

## 2018-10-14 ENCOUNTER — Inpatient Hospital Stay: Payer: Medicare Other | Attending: Hematology and Oncology

## 2018-10-14 DIAGNOSIS — M818 Other osteoporosis without current pathological fracture: Secondary | ICD-10-CM | POA: Insufficient documentation

## 2018-10-14 DIAGNOSIS — C50912 Malignant neoplasm of unspecified site of left female breast: Secondary | ICD-10-CM | POA: Insufficient documentation

## 2018-10-14 DIAGNOSIS — M8580 Other specified disorders of bone density and structure, unspecified site: Secondary | ICD-10-CM

## 2018-10-14 LAB — BASIC METABOLIC PANEL
Anion gap: 7 (ref 5–15)
BUN: 28 mg/dL — ABNORMAL HIGH (ref 8–23)
CO2: 27 mmol/L (ref 22–32)
Calcium: 9.8 mg/dL (ref 8.9–10.3)
Chloride: 108 mmol/L (ref 98–111)
Creatinine, Ser: 1.63 mg/dL — ABNORMAL HIGH (ref 0.44–1.00)
GFR calc Af Amer: 33 mL/min — ABNORMAL LOW (ref >60)
GFR calc non Af Amer: 28 mL/min — ABNORMAL LOW (ref >60)
Glucose, Bld: 109 mg/dL — ABNORMAL HIGH (ref 70–99)
Potassium: 4.8 mmol/L (ref 3.5–5.1)
Sodium: 142 mmol/L (ref 135–145)

## 2018-10-14 MED ORDER — DENOSUMAB 60 MG/ML ~~LOC~~ SOSY
60.0000 mg | PREFILLED_SYRINGE | Freq: Once | SUBCUTANEOUS | Status: AC
  Administered 2018-10-14: 60 mg via SUBCUTANEOUS
  Filled 2018-10-14: qty 1

## 2018-10-15 ENCOUNTER — Other Ambulatory Visit: Payer: Self-pay | Admitting: *Deleted

## 2018-10-15 ENCOUNTER — Telehealth: Payer: Self-pay | Admitting: *Deleted

## 2018-10-15 DIAGNOSIS — C50912 Malignant neoplasm of unspecified site of left female breast: Secondary | ICD-10-CM

## 2018-10-15 NOTE — Telephone Encounter (Signed)
Called patient and LVM to inform her that she will need to rtc in one week for repeat lab to check her calcium in regards to her renal function.  Scheduling will call patient with appointment.

## 2018-10-15 NOTE — Telephone Encounter (Signed)
-----   Message from Karen Kitchens, NP sent at 10/15/2018  4:02 PM EDT ----- Regarding: FW: Did she receive Prolia? BMP in a week. Risk for hypocalcemia r/t renal function.  ----- Message ----- From: Lequita Asal, MD Sent: 10/15/2018   3:13 PM EDT To: Karen Kitchens, NP Subject: FW: Did she receive Prolia?                     Risk for hypocalcemia  Let's follow-up her calcium in a week.  M  ----- Message ----- From: Karen Kitchens, NP Sent: 10/15/2018   3:12 PM EDT To: Lequita Asal, MD Subject: RE: Did she receive Prolia?                    It looks like she did.  ----- Message ----- From: Lequita Asal, MD Sent: 10/15/2018   2:20 PM EDT To: Karen Kitchens, NP Subject: Did she receive Prolia?                         Creatinine clearance low.    M  ----- Message ----- From: Interface, Lab In [place] Sent: 10/14/2018   1:42 PM EDT To: Lequita Asal, MD

## 2018-10-22 ENCOUNTER — Inpatient Hospital Stay: Payer: Medicare Other

## 2018-10-22 ENCOUNTER — Telehealth: Payer: Self-pay | Admitting: *Deleted

## 2018-10-22 DIAGNOSIS — C50912 Malignant neoplasm of unspecified site of left female breast: Secondary | ICD-10-CM

## 2018-10-22 DIAGNOSIS — M818 Other osteoporosis without current pathological fracture: Secondary | ICD-10-CM | POA: Diagnosis not present

## 2018-10-22 LAB — BASIC METABOLIC PANEL
ANION GAP: 6 (ref 5–15)
BUN: 24 mg/dL — ABNORMAL HIGH (ref 8–23)
CHLORIDE: 115 mmol/L — AB (ref 98–111)
CO2: 24 mmol/L (ref 22–32)
Calcium: 7.7 mg/dL — ABNORMAL LOW (ref 8.9–10.3)
Creatinine, Ser: 1.4 mg/dL — ABNORMAL HIGH (ref 0.44–1.00)
GFR calc Af Amer: 40 mL/min — ABNORMAL LOW (ref >60)
GFR, EST NON AFRICAN AMERICAN: 34 mL/min — AB (ref >60)
GLUCOSE: 87 mg/dL (ref 70–99)
POTASSIUM: 4.5 mmol/L (ref 3.5–5.1)
Sodium: 145 mmol/L (ref 135–145)

## 2018-10-22 NOTE — Telephone Encounter (Signed)
-----   Message from Karen Kitchens, NP sent at 10/22/2018  2:54 PM EDT ----- She needs calcium. If she is taking, she needs to increase for a week. Calcium low at 7.7.  Check labs (BMP) 1 week after dose is increased.   Gaspar Bidding  ----- Message ----- From: Buel Ream, Lab In [place] Sent: 10/22/2018   2:47 PM EDT To: Karen Kitchens, NP

## 2018-10-22 NOTE — Telephone Encounter (Signed)
Called patient to inquire if she is taking calcium.  She states she is taking OTC but thinks it is 600 mg daily.  Per Gaspar Bidding, NP patient instructed to take 2 tablets daily for one week.  We will recheck lab in one week. Patient verbalized understanding.

## 2018-10-29 ENCOUNTER — Inpatient Hospital Stay: Payer: Medicare Other | Attending: Hematology and Oncology

## 2018-10-29 DIAGNOSIS — C50912 Malignant neoplasm of unspecified site of left female breast: Secondary | ICD-10-CM | POA: Insufficient documentation

## 2018-10-29 LAB — BASIC METABOLIC PANEL
Anion gap: 4 — ABNORMAL LOW (ref 5–15)
BUN: 17 mg/dL (ref 8–23)
CALCIUM: 9.4 mg/dL (ref 8.9–10.3)
CO2: 25 mmol/L (ref 22–32)
CREATININE: 1.33 mg/dL — AB (ref 0.44–1.00)
Chloride: 108 mmol/L (ref 98–111)
GFR, EST AFRICAN AMERICAN: 42 mL/min — AB (ref >60)
GFR, EST NON AFRICAN AMERICAN: 36 mL/min — AB (ref >60)
Glucose, Bld: 139 mg/dL — ABNORMAL HIGH (ref 70–99)
Potassium: 4.8 mmol/L (ref 3.5–5.1)
Sodium: 137 mmol/L (ref 135–145)

## 2018-11-06 ENCOUNTER — Ambulatory Visit: Payer: Medicare Other | Admitting: Podiatry

## 2018-11-10 ENCOUNTER — Ambulatory Visit (INDEPENDENT_AMBULATORY_CARE_PROVIDER_SITE_OTHER): Payer: Medicare Other | Admitting: Podiatry

## 2018-11-10 ENCOUNTER — Encounter: Payer: Self-pay | Admitting: Podiatry

## 2018-11-10 DIAGNOSIS — M79676 Pain in unspecified toe(s): Secondary | ICD-10-CM

## 2018-11-10 DIAGNOSIS — D689 Coagulation defect, unspecified: Secondary | ICD-10-CM

## 2018-11-10 DIAGNOSIS — B351 Tinea unguium: Secondary | ICD-10-CM | POA: Diagnosis not present

## 2018-11-10 NOTE — Progress Notes (Signed)
Complaint:  Visit Type: Patient returns to my office for continued preventative foot care services. Complaint: Patient states" my nails have grown long and thick and become painful to walk and wear shoes"  The patient presents for preventative foot care services. No changes to ROS.  Patient is taking plavix.  Podiatric Exam: Vascular: dorsalis pedis and posterior tibial pulses are palpable bilateral. Capillary return is immediate. Temperature gradient is WNL. Skin turgor WNL  Sensorium: Normal Semmes Weinstein monofilament test. Normal tactile sensation bilaterally. Nail Exam: Pt has thick disfigured discolored nails with subungual debris noted bilateral entire nail hallux through fifth toenails Ulcer Exam: There is no evidence of ulcer or pre-ulcerative changes or infection. Orthopedic Exam: Muscle tone and strength are WNL. No limitations in general ROM. No crepitus or effusions noted. Foot type and digits show no abnormalities. Bony prominences are unremarkable. Skin: No Porokeratosis. No infection or ulcers  Diagnosis:  Onychomycosis, , Pain in right toe, pain in left toes  Treatment & Plan Procedures and Treatment: Consent by patient was obtained for treatment procedures. The patient understood the discussion of treatment and procedures well. All questions were answered thoroughly reviewed. Debridement of mycotic and hypertrophic toenails, 1 through 5 bilateral and clearing of subungual debris. No ulceration, no infection noted. ABN signed for 2019. Return Visit-Office Procedure: Patient instructed to return to the office for a follow up visit 3 months for continued evaluation and treatment.    Gardiner Barefoot DPM

## 2019-01-26 ENCOUNTER — Telehealth: Payer: Self-pay | Admitting: *Deleted

## 2019-01-26 NOTE — Telephone Encounter (Signed)
Patient is having increased swelling with lymphadema. The pateint states this has been going on a while but just recently has gotten worse.   She is in recalls for follow up in May 2020 for mammogram and office visit with Dr. Bary Castilla.  Patient thinks she may need a referral to OT to be wrapped.   An office visit was scheduled for the patient to see Dr. Bary Castilla on Thursday, 01-29-19 at 10:45 am.  Note routed to Dr. Bary Castilla.

## 2019-01-27 NOTE — Telephone Encounter (Signed)
Per Dr Bary Castilla the patient may come in today to see him. I have left the patient a message about this.

## 2019-01-27 NOTE — Telephone Encounter (Signed)
The patient called back and said that she is in the bed sick. She states that she is fine with waiting to come in on Thursday.

## 2019-01-29 ENCOUNTER — Ambulatory Visit: Payer: Medicare Other | Admitting: General Surgery

## 2019-02-12 ENCOUNTER — Ambulatory Visit (INDEPENDENT_AMBULATORY_CARE_PROVIDER_SITE_OTHER): Payer: Medicare Other | Admitting: General Surgery

## 2019-02-12 ENCOUNTER — Other Ambulatory Visit: Payer: Self-pay

## 2019-02-12 ENCOUNTER — Ambulatory Visit (INDEPENDENT_AMBULATORY_CARE_PROVIDER_SITE_OTHER): Payer: Medicare Other | Admitting: Podiatry

## 2019-02-12 ENCOUNTER — Encounter: Payer: Self-pay | Admitting: Podiatry

## 2019-02-12 ENCOUNTER — Encounter: Payer: Self-pay | Admitting: General Surgery

## 2019-02-12 VITALS — BP 121/66 | HR 98 | Temp 97.3°F | Resp 16 | Ht 65.0 in | Wt 179.8 lb

## 2019-02-12 DIAGNOSIS — M79676 Pain in unspecified toe(s): Secondary | ICD-10-CM

## 2019-02-12 DIAGNOSIS — D689 Coagulation defect, unspecified: Secondary | ICD-10-CM

## 2019-02-12 DIAGNOSIS — I890 Lymphedema, not elsewhere classified: Secondary | ICD-10-CM

## 2019-02-12 DIAGNOSIS — B351 Tinea unguium: Secondary | ICD-10-CM

## 2019-02-12 NOTE — Patient Instructions (Signed)
We will send the referral to Ms.Dupree.   We will see you back in the fall of 2020.

## 2019-02-12 NOTE — Progress Notes (Signed)
Patient ID: Hailey Ellison, female   DOB: [birth year], 84 y.o.   MRN: [id number]  Chief Complaint  Patient presents with  . Follow-up    Lymphaedema    HPI Hailey Ellison is a 84 y.o. female.  Here today for Hailey Ellison left arm. She states she needs a new hand and arm sleeve. No other complaints at this time. HPI  Past Medical History:  Diagnosis Date  . Anxiety   . Arthritis   . Breast cancer ([place]) 2010   Adjuvant chemotherapy with Taxotere/Carboplatin/Herceptin. Arimidex initiated November 2011  . Cancer ([place]) 2010   T1c,N2a, M0; ER 90%, PR 60%,  HER-2/neu 2+ IHC, equivocal on fish. 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  Margins clear.  . Chronic kidney disease 2014  . Depression   . GERD (gastroesophageal reflux disease)   . Heart attack ([place]) 06/09/2016  . Hypertension   . Hailey Ellison    left arm  . Murmur   . Osteoporosis   . Personal history of chemotherapy   . Radiation 2010   with chemo  . Squamous cell carcinoma June 2016   Left upper inner chest wall, area of previous radiation.  . Stroke ([place])    per pt, TIA a long time ago.  . Tinnitus     Past Surgical History:  Procedure Laterality Date  . ABDOMINAL HYSTERECTOMY    . BREAST BIOPSY Left 2010   positive  . BREAST BIOPSY Right    neg  . BREAST CYST EXCISION    . BREAST EXCISIONAL BIOPSY Left 07/26/2017   neg/fat necrosis  . BREAST LUMPECTOMY Left 2010  . CARDIAC CATHETERIZATION N/A 06/08/2016   Procedure: Left Heart Cath and Coronary Angiography;  Surgeon: Wellington Hampshire, MD;  Location: [facility];  Service: Cardiovascular;  Laterality: N/A;  . CARDIAC CATHETERIZATION N/A 06/08/2016   Procedure: Coronary Stent Intervention;  Surgeon: Wellington Hampshire, MD;  Location: [facility];  Service: Cardiovascular;  Laterality: N/A;  . CARPAL TUNNEL RELEASE Bilateral   .  CHOLECYSTECTOMY    . JOINT REPLACEMENT Left 2008   Total Knee Replacement  . MASTECTOMY, PARTIAL Left 07/26/2017   Procedure: MASTECTOMY PARTIAL;  Surgeon: Robert Bellow, MD;  Location: [facility];  Service: General;  Laterality: Left;  . PARTIAL HYSTERECTOMY  1980's  . PORT-A-CATH REMOVAL      Family History  Problem Relation Age of Onset  . Cancer Sister        breast  . Cancer Sister        kidney  . Hypertension Mother   . Hypertension Father     Social History Social History   Tobacco Use  . Smoking status: Former Smoker    Types: Cigarettes  . Smokeless tobacco: Never Used  . Tobacco comment: only smoked a few cigs throughout her lifetime  Substance Use Topics  . Alcohol use: No    Alcohol/week: 0.0 standard drinks  . Drug use: No    Allergies  Allergen Reactions  . Alendronate Shortness Of Breath  . Benadryl [Diphenhydramine Hcl] Hives  . Tegaderm Ag Mesh [Silver]     tegaderm causes rash  . Neosporin [Neomycin-Bacitracin Zn-Polymyx] Rash    Tolerates Polysporin(bacitracin zinc - polymyxin B)  . Sulfa Antibiotics Rash  . Tape Rash    Tape and bandaids    Current Outpatient Medications  Medication Sig  Dispense Refill  . anastrozole (ARIMIDEX) 1 MG tablet TAKE 1 TABLET DAILY 90 tablet 4  . aspirin 81 MG chewable tablet Chew 1 tablet (81 mg total) by mouth daily. 30 tablet 2  . atorvastatin (LIPITOR) 80 MG tablet Take 80 mg by mouth at bedtime.    . calcium citrate-vitamin D (CITRACAL+D) 315-200 MG-UNIT per tablet Take 1 tablet by mouth 2 (two) times daily.    . clopidogrel (PLAVIX) 75 MG tablet Take 1 tablet (75 mg total) by mouth daily. 1 tablet 0  . ergocalciferol (VITAMIN D2) 50000 units capsule Take 50,000 Units by mouth every 30 (thirty) days.     . furosemide (LASIX) 20 MG tablet Take 20 mg by mouth daily as needed for fluid.     Marland Kitchen gabapentin (NEURONTIN) 300 MG capsule     . HYDROcodone-acetaminophen (NORCO) 5-325 MG tablet Take 1 tablet by mouth  every 4 (four) hours as needed for moderate pain. 20 tablet 0  . lansoprazole (PREVACID) 30 MG capsule Take 30 mg by mouth daily.    Marland Kitchen losartan (COZAAR) 50 MG tablet Take 50 mg by mouth daily.    . Multiple Vitamin (MULTIVITAMIN WITH MINERALS) TABS Take 1 tablet by mouth daily.    Marland Kitchen nystatin cream (MYCOSTATIN) Apply 1 application topically 2 (two) times daily.    Marland Kitchen PARoxetine (PAXIL) 40 MG tablet Take 40 mg by mouth at bedtime.     . triamcinolone cream (KENALOG) 0.1 %     . vitamin B-12 (CYANOCOBALAMIN) 500 MCG tablet Take 500 mcg by mouth daily.     No current facility-administered medications for this visit.     Review of Systems Review of Systems  Constitutional: Negative.   Respiratory: Negative.   Cardiovascular: Negative.     Blood pressure 121/66, pulse 98, temperature (!) 97.3 F (36.3 C), temperature source Temporal, resp. rate 16, height '5\' 5"'$  (1.651 m), weight 179 lb 12.8 oz (81.6 kg), SpO2 94 %.  Physical Exam Physical Exam Exam conducted with a chaperone present.  Constitutional:      Appearance: She is well-developed.  Eyes:     General: No scleral icterus.    Conjunctiva/sclera: Conjunctivae normal.  Neck:     Musculoskeletal: Normal range of motion.  Cardiovascular:     Rate and Rhythm: Normal rate and regular rhythm.     Heart sounds: Normal heart sounds.  Pulmonary:     Effort: Pulmonary effort is normal.     Breath sounds: Normal breath sounds.  Chest:     Breasts:        Right: No inverted nipple, mass, nipple discharge, skin change or tenderness.        Left: No inverted nipple, mass, nipple discharge, skin change or tenderness.    Lymphadenopathy:     Cervical: No cervical adenopathy.  Skin:    General: Skin is warm and dry.  Neurological:     Mental Status: She is alert and oriented to person, place, and time.     Data Reviewed Upper extremity measurements obtained at a location 15 cm above as well as 10 and 20 cm below the olecranon  process were obtained.  February 12, 2019: Right: 29, 27, 19 cm. Left: 34, 32, 24 cm.  August 04, 2014: Right: 29.5, 26, 19 cm. Left: 29, 28.5, 21.5 cm.  Assessment Recurrent Hailey Ellison.   Plan  The patient sleeve is several years old and is no longer providing adequate compression.  She is also noted the development of  hand swelling.  Were going to have her go back to occupational therapy and see Luna Fuse for her assessment and obtaining new hand and sleeve compressive garments.   We will see you back as scheduled for mammogram review.    HPI, Physical Exam, Assessment and Plan have been scribed under the direction and in the presence of Robert Bellow, MD. Jonnie Finner, CMA  I have completed the exam and reviewed the above documentation for accuracy and completeness.  I agree with the above.  Haematologist has been used and any errors in dictation or transcription are unintentional.  Hervey Ard, M.D., F.A.C.S.  Forest Gleason Dyllen Menning 02/12/2019, 1:59 PM

## 2019-02-12 NOTE — Progress Notes (Signed)
Complaint:  Visit Type: Patient returns to my office for continued preventative foot care services. Complaint: Patient states" my nails have grown long and thick and become painful to walk and wear shoes"  The patient presents for preventative foot care services. No changes to ROS.  Patient is taking plavix.  Podiatric Exam: Vascular: dorsalis pedis and posterior tibial pulses are palpable bilateral. Capillary return is immediate. Temperature gradient is WNL. Skin turgor WNL  Sensorium: Normal Semmes Weinstein monofilament test. Normal tactile sensation bilaterally. Nail Exam: Pt has thick disfigured discolored nails with subungual debris noted bilateral entire nail hallux through fifth toenails Ulcer Exam: There is no evidence of ulcer or pre-ulcerative changes or infection. Orthopedic Exam: Muscle tone and strength are WNL. No limitations in general ROM. No crepitus or effusions noted. Foot type and digits show no abnormalities. Bony prominences are unremarkable. Skin: No Porokeratosis. No infection or ulcers  Diagnosis:  Onychomycosis, , Pain in right toe, pain in left toes  Treatment & Plan Procedures and Treatment: Consent by patient was obtained for treatment procedures. The patient understood the discussion of treatment and procedures well. All questions were answered thoroughly reviewed. Debridement of mycotic and hypertrophic toenails, 1 through 5 bilateral and clearing of subungual debris. No ulceration, no infection noted.  Return Visit-Office Procedure: Patient instructed to return to the office for a follow up visit 3 months for continued evaluation and treatment.    Hettie Roselli DPM 

## 2019-02-18 ENCOUNTER — Telehealth: Payer: Self-pay | Admitting: *Deleted

## 2019-02-18 ENCOUNTER — Ambulatory Visit: Payer: Medicare Other | Admitting: Occupational Therapy

## 2019-02-18 NOTE — Telephone Encounter (Signed)
Message left that office of Rosalyn Gess, OT has been trying to get in contact with patient regarding an appointment and have not received a call back.   I did leave a message for patient to call their office back at [phone] to arrange an appointment.

## 2019-02-20 ENCOUNTER — Encounter: Payer: Self-pay | Admitting: Occupational Therapy

## 2019-02-20 ENCOUNTER — Other Ambulatory Visit: Payer: Self-pay

## 2019-02-20 ENCOUNTER — Ambulatory Visit: Payer: Medicare Other | Attending: General Surgery | Admitting: Occupational Therapy

## 2019-02-20 DIAGNOSIS — I972 Postmastectomy lymphedema syndrome: Secondary | ICD-10-CM | POA: Insufficient documentation

## 2019-02-20 NOTE — Therapy (Signed)
[facility] [address], [place], [address] Phone: [phone]   Fax:  [phone]  Occupational Therapy Evaluation  Patient Details  Name: Hailey Ellison MRN: [id number] Date of Birth: Jul 01, 1935 Referring Provider (OT): Byrnett   Encounter Date: 02/20/2019  OT End of Session - 02/20/19 1459    Visit Number  1    Number of Visits  4    Date for OT Re-Evaluation  03/20/19    OT Start Time  1208    OT Stop Time  1242    OT Time Calculation (min)  34 min    Activity Tolerance  Patient tolerated treatment well    Behavior During Therapy  [facility] for tasks assessed/performed       Past Medical History:  Diagnosis Date  . Anxiety   . Arthritis   . Breast cancer ([place]) 2010   Adjuvant chemotherapy with Taxotere/Carboplatin/Herceptin. Arimidex initiated November 2011  . Cancer ([place]) 2010   T1c,N2a, M0; ER 90%, PR 60%,  HER-2/neu 2+ IHC, equivocal on fish. 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  Margins clear.  . Chronic kidney disease 2014  . Depression   . GERD (gastroesophageal reflux disease)   . Heart attack ([place]) 06/09/2016  . Hypertension   . Lymphedema    left arm  . Murmur   . Osteoporosis   . Personal history of chemotherapy   . Radiation 2010   with chemo  . Squamous cell carcinoma June 2016   Left upper inner chest wall, area of previous radiation.  . Stroke ([place])    per pt, TIA a long time ago.  . Tinnitus     Past Surgical History:  Procedure Laterality Date  . ABDOMINAL HYSTERECTOMY    . BREAST BIOPSY Left 2010   positive  . BREAST BIOPSY Right    neg  . BREAST CYST EXCISION    . BREAST EXCISIONAL BIOPSY Left 07/26/2017   neg/fat necrosis  . BREAST LUMPECTOMY Left 2010  . CARDIAC CATHETERIZATION N/A 06/08/2016   Procedure: Left Heart Cath and Coronary  Angiography;  Surgeon: Wellington Hampshire, MD;  Location: [facility];  Service: Cardiovascular;  Laterality: N/A;  . CARDIAC CATHETERIZATION N/A 06/08/2016   Procedure: Coronary Stent Intervention;  Surgeon: Wellington Hampshire, MD;  Location: [facility];  Service: Cardiovascular;  Laterality: N/A;  . CARPAL TUNNEL RELEASE Bilateral   . CHOLECYSTECTOMY    . JOINT REPLACEMENT Left 2008   Total Knee Replacement  . MASTECTOMY, PARTIAL Left 07/26/2017   Procedure: MASTECTOMY PARTIAL;  Surgeon: Robert Bellow, MD;  Location: [facility];  Service: General;  Laterality: Left;  . PARTIAL HYSTERECTOMY  1980's  . PORT-A-CATH REMOVAL      There were no vitals filed for this visit.  Subjective Assessment - 02/20/19 1455    Subjective   I seen you few years ago -and I need a new compressoin sleeve and glove - lost me glove probably 2-3 x and then my night time do not leave marks on my arms in the am anymore - my hand do swell - my sleeve just do not have any compression in anymore     Patient Stated Goals  I do need a new compression glove and also night time compression - do not want my arm to get worse  Currently in Pain?  No/denies        [facility] OT Assessment - 02/20/19 0001      Assessment   Medical Diagnosis  L UE lymphedema    Referring Provider (OT)  Byrnett    Onset Date/Surgical Date  --   2010   Hand Dominance  Right    Prior Therapy  --   Has been seen several times during 10 yrs last in 2018; 15     Precautions   Precaution Comments  --   lymphedema      Home  Environment   Lives With  Alone   sleep at night with daughter     Prior Function   Vocation  Retired    Leisure  Likes to spend time with family        LYMPHEDEMA/ONCOLOGY QUESTIONNAIRE - 02/20/19 1220      Right Upper Extremity Lymphedema   15 cm Proximal to Olecranon Process  30.4 cm    10 cm Proximal to Olecranon Process  28 cm    Olecranon Process  26.4 cm    15 cm Proximal to Ulnar  Styloid Process  25.4 cm    10 cm Proximal to Ulnar Styloid Process  21.8 cm    Just Proximal to Ulnar Styloid Process  17 cm    Across Hand at [employer]  19 cm    At [place] of 2nd Digit  5.8 cm    At [facility] of [facility]  6 cm      Left Upper Extremity Lymphedema   15 cm Proximal to Olecranon Process  32.5 cm    10 cm Proximal to Olecranon Process  33 cm    Olecranon Process  29.4 cm    15 cm Proximal to Ulnar Styloid Process  30.8 cm    10 cm Proximal to Ulnar Styloid Process  27.3 cm    Just Proximal to Ulnar Styloid Process  18.8 cm    Across Hand at [employer]  19.6 cm    At [place] of 2nd Digit  6.4 cm    At [facility] of [place]  6.2 cm         Pt to come back next week to assess night time compression sleeve  And OT will check on pink ribbon fund help in meantime and appt with Rep to measure her for new compression sleeve and glove              OT Education - 02/20/19 1459    Education provided  Yes    Education Details  findings and POC     Person(s) Educated  Patient    Methods  Explanation;Demonstration    Comprehension  Verbalized understanding;Returned demonstration          OT Long Term Goals - 02/20/19 1504      OT LONG TERM GOAL #1   Title  Pt to be ind in wearing of new daytime and night time compression garments to keep lymphedema in L UE under control     Baseline  garments more than 3-5 yrs old - did not replace     Time  4    Period  Weeks    Status  New    Target Date  03/20/19            Plan - 02/20/19 1501    Clinical Impression Statement  Pt refer to OT to assess need for new daytime compression sleeve and  glove - pt had breast CA in 2010 - and was seen by this OT/CLT few time during the years - was wearing night time Reidsleeve and daytime custom Jobst elvarex soft sleeve and glove - was seen the last 2 times in 2015 and 2018 - pt measurements of L UE actually decreased since last time - but pt did loose some weight and decrease in R  UE too - pt night time per pt do not show pressure marks in the am -and daytime one is about  1 and 1/82 yrs old - pt can benefit from new daytime compression sleeve and will assess  next week night time sleeve     Occupational performance deficits (Please refer to evaluation for details):  ADL's;IADL's    Rehab Potential  Good    OT Frequency  1x / week    OT Duration  4 weeks    OT Treatment/Interventions  Patient/family education;Self-care/ADL training;Scar mobilization;Manual lymph drainage    Plan  assess fit for night time sleeve and send for daytime measurements     Clinical Decision Making  Limited treatment options, no task modification necessary    OT Home Exercise Plan  see pt instruction     Consulted and Agree with Plan of Care  Patient       Patient will benefit from skilled therapeutic intervention in order to improve the following deficits and impairments:  Increased edema, Decreased scar mobility, Impaired UE functional use  Visit Diagnosis: Postmastectomy lymphedema syndrome - Plan: Ot plan of care cert/re-cert    Problem List Patient Active Problem List   Diagnosis Date Noted  . Osteopenia 09/07/2017  . ST elevation myocardial infarction (STEMI) of inferior wall ([place]) 06/08/2016  . ST elevation myocardial infarction involving right coronary artery ([place])   . Fat necrosis (segmental) of breast 07/26/2015  . Squamous cell carcinoma in situ 05/25/2015  . Lymphedema 08/05/2014  . Personal history of malignant neoplasm of breast 05/25/2014  . Breast cancer, stage 2 ([place]) 04/08/2013    Rosalyn Gess OTR/l,CLT 02/20/2019, 3:06 PM  [facility] [address] [place], [address] Phone: [phone]   Fax:  [phone]  Name: Hailey Ellison MRN: [id number] Date of Birth: 1935-05-29

## 2019-02-20 NOTE — Patient Instructions (Signed)
Pt to come back next week to assess night time compression sleeve  And OT will check on pink ribbon fund help in meantime and appt with Rep to measure her for new compression sleeve and glove

## 2019-02-26 ENCOUNTER — Ambulatory Visit: Payer: Medicare Other | Attending: General Surgery | Admitting: Occupational Therapy

## 2019-02-26 DIAGNOSIS — I972 Postmastectomy lymphedema syndrome: Secondary | ICD-10-CM | POA: Diagnosis present

## 2019-02-26 NOTE — Patient Instructions (Signed)
Adjusted her old RM Hailey Ellison - to wear pt still see some indentions after taking it off in her forearm  Pt send to get measure for new day sleeve and glove as well as night sleeve with glove

## 2019-02-26 NOTE — Therapy (Signed)
[facility] [address] [place], [address] Phone: [phone]   Fax:  [phone]  Occupational Therapy Treatment  Patient Details  Name: Hailey Ellison MRN: [id number] Date of Birth: 04-08-1935 Referring Provider (OT): Byrnett   Encounter Date: 02/26/2019  OT End of Session - 02/26/19 1944    Visit Number  2    Number of Visits  4    Date for OT Re-Evaluation  03/20/19    OT Start Time  8341    OT Stop Time  1508    OT Time Calculation (min)  23 min    Activity Tolerance  Patient tolerated treatment well    Behavior During Therapy  [facility] for tasks assessed/performed       Past Medical History:  Diagnosis Date  . Anxiety   . Arthritis   . Breast cancer ([place]) 2010   Adjuvant chemotherapy with Taxotere/Carboplatin/Herceptin. Arimidex initiated November 2011  . Cancer ([place]) 2010   T1c,N2a, M0; ER 90%, PR 60%,  HER-2/neu 2+ IHC, equivocal on fish. 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  Margins clear.  . Chronic kidney disease 2014  . Depression   . GERD (gastroesophageal reflux disease)   . Heart attack ([place]) 06/09/2016  . Hypertension   . Lymphedema    left arm  . Murmur   . Osteoporosis   . Personal history of chemotherapy   . Radiation 2010   with chemo  . Squamous cell carcinoma June 2016   Left upper inner chest wall, area of previous radiation.  . Stroke ([place])    per pt, TIA a long time ago.  . Tinnitus     Past Surgical History:  Procedure Laterality Date  . ABDOMINAL HYSTERECTOMY    . BREAST BIOPSY Left 2010   positive  . BREAST BIOPSY Right    neg  . BREAST CYST EXCISION    . BREAST EXCISIONAL BIOPSY Left 07/26/2017   neg/fat necrosis  . BREAST LUMPECTOMY Left 2010  . CARDIAC CATHETERIZATION N/A 06/08/2016   Procedure: Left Heart Cath and Coronary  Angiography;  Surgeon: Wellington Hampshire, MD;  Location: [place];  Service: Cardiovascular;  Laterality: N/A;  . CARDIAC CATHETERIZATION N/A 06/08/2016   Procedure: Coronary Stent Intervention;  Surgeon: Wellington Hampshire, MD;  Location: [facility];  Service: Cardiovascular;  Laterality: N/A;  . CARPAL TUNNEL RELEASE Bilateral   . CHOLECYSTECTOMY    . JOINT REPLACEMENT Left 2008   Total Knee Replacement  . MASTECTOMY, PARTIAL Left 07/26/2017   Procedure: MASTECTOMY PARTIAL;  Surgeon: Robert Bellow, MD;  Location: [facility];  Service: General;  Laterality: Left;  . PARTIAL HYSTERECTOMY  1980's  . PORT-A-CATH REMOVAL      There were no vitals filed for this visit.  Subjective Assessment - 02/26/19 1942    Subjective   I did brought my night time sleeves - but they are so old and not working really - the padding is flat - and my old daysleeve just slides off     Patient Stated Goals  I do need a new compression glove and also night time compression - do not want my arm to get worse     Currently in Pain?  No/denies          LYMPHEDEMA/ONCOLOGY QUESTIONNAIRE - 02/26/19 1445  Left Upper Extremity Lymphedema   15 cm Proximal to Olecranon Process  32 cm  (Pended)     10 cm Proximal to Olecranon Process  33 cm  (Pended)     Olecranon Process  29 cm  (Pended)     15 cm Proximal to Ulnar Styloid Process  30 cm  (Pended)     10 cm Proximal to Ulnar Styloid Process  26.5 cm  (Pended)     Just Proximal to Ulnar Styloid Process  18.5 cm  (Pended)     Across Hand at [employer]  19.5 cm  (Pended)     At [place] of 2nd Digit  5.9 cm  (Pended)     At [facility]  6.1 cm  (Pended)        Pt arrive with her old daysleeve on - her measurements retaken - compare to last week did decrease less than 1 cm in wrist to elbow - She brought her 22 old night time compression - old , dirty and flat - did adjust it for her to use it from few more wks until new one comes in  She  was send to REP to get measure for new custom Jobst Elvarex soft sleeve and glove  And night time Jubilee sleeve and glove -  Pt to contact me when she gets it to assess fit                OT Education - 02/26/19 1944    Education provided  Yes    Education Details  sent to Rep to get measure for new compression garments -and call me to assess fit when she gets it     Person(s) Educated  Patient    Methods  Explanation;Demonstration    Comprehension  Verbalized understanding;Returned demonstration          OT Long Term Goals - 02/20/19 1504      OT LONG TERM GOAL #1   Title  Pt to be ind in wearing of new daytime and night time compression garments to keep lymphedema in L UE under control     Baseline  garments more than 3-5 yrs old - did not replace     Time  4    Period  Weeks    Status  New    Target Date  03/20/19            Plan - 02/26/19 1945    Clinical Impression Statement  Pt L UE did decrease in forearm and elbow less than 1 cm since last time pt in need for new daysleeve and glove - Custom Jobst Elvarex soft - and Jubilee night sleeve with glove - discuss with Rep and pt send to get measured - her night time ones are dirty , flat the foam -and daysleeve is more than 1 1/82 yrs old - pt to contact me when she gets her new sleeves to assess fit     Occupational performance deficits (Please refer to evaluation for details):  ADL's;IADL's    Rehab Potential  Good    Clinical Decision Making  Limited treatment options, no task modification necessary    OT Frequency  1x / week    OT Duration  4 weeks    OT Treatment/Interventions  Patient/family education;Self-care/ADL training;Scar mobilization;Manual lymph drainage    Plan  assess fit for night time sleeve and daysleeve when she gets new ones     OT Home Exercise Plan  see pt instruction     Consulted and Agree with Plan of Care  Patient       Patient will benefit from skilled therapeutic  intervention in order to improve the following deficits and impairments:     Visit Diagnosis: Postmastectomy lymphedema syndrome    Problem List Patient Active Problem List   Diagnosis Date Noted  . Osteopenia 09/07/2017  . ST elevation myocardial infarction (STEMI) of inferior wall ([place]) 06/08/2016  . ST elevation myocardial infarction involving right coronary artery ([place])   . Fat necrosis (segmental) of breast 07/26/2015  . Squamous cell carcinoma in situ 05/25/2015  . Lymphedema 08/05/2014  . Personal history of malignant neoplasm of breast 05/25/2014  . Breast cancer, stage 2 ([place]) 04/08/2013    Rosalyn Gess OTR/L,CLT 02/26/2019, 7:48 PM  [facility] [address] [place], [address] Phone: [phone]   Fax:  [phone]  Name: CHRISLYNN MOSELY MRN: [id number] Date of Birth: [birth year]

## 2019-04-13 ENCOUNTER — Other Ambulatory Visit: Payer: Self-pay | Admitting: General Surgery

## 2019-04-20 ENCOUNTER — Other Ambulatory Visit: Payer: Self-pay

## 2019-04-20 DIAGNOSIS — Z1231 Encounter for screening mammogram for malignant neoplasm of breast: Secondary | ICD-10-CM

## 2019-04-21 DIAGNOSIS — I350 Nonrheumatic aortic (valve) stenosis: Secondary | ICD-10-CM

## 2019-04-23 ENCOUNTER — Ambulatory Visit
Admission: RE | Admit: 2019-04-23 | Discharge: 2019-04-23 | Disposition: A | Discharge status: Home / Self Care | Payer: Medicare Other | Attending: Internal Medicine | Admitting: Internal Medicine

## 2019-04-23 ENCOUNTER — Other Ambulatory Visit: Payer: Self-pay

## 2019-04-23 ENCOUNTER — Encounter
Admission: RE | Disposition: A | Discharge status: Home / Self Care | Payer: Self-pay | Source: Home / Self Care | Attending: Internal Medicine

## 2019-04-23 DIAGNOSIS — Z7902 Long term (current) use of antithrombotics/antiplatelets: Secondary | ICD-10-CM | POA: Diagnosis not present

## 2019-04-23 DIAGNOSIS — Z888 Allergy status to other drugs, medicaments and biological substances status: Secondary | ICD-10-CM | POA: Insufficient documentation

## 2019-04-23 DIAGNOSIS — I252 Old myocardial infarction: Secondary | ICD-10-CM | POA: Insufficient documentation

## 2019-04-23 DIAGNOSIS — Z7982 Long term (current) use of aspirin: Secondary | ICD-10-CM | POA: Insufficient documentation

## 2019-04-23 DIAGNOSIS — Z841 Family history of disorders of kidney and ureter: Secondary | ICD-10-CM | POA: Insufficient documentation

## 2019-04-23 DIAGNOSIS — Z8262 Family history of osteoporosis: Secondary | ICD-10-CM | POA: Insufficient documentation

## 2019-04-23 DIAGNOSIS — Z823 Family history of stroke: Secondary | ICD-10-CM | POA: Insufficient documentation

## 2019-04-23 DIAGNOSIS — Z9049 Acquired absence of other specified parts of digestive tract: Secondary | ICD-10-CM | POA: Diagnosis not present

## 2019-04-23 DIAGNOSIS — N289 Disorder of kidney and ureter, unspecified: Secondary | ICD-10-CM | POA: Insufficient documentation

## 2019-04-23 DIAGNOSIS — Z881 Allergy status to other antibiotic agents status: Secondary | ICD-10-CM | POA: Diagnosis not present

## 2019-04-23 DIAGNOSIS — Z91040 Latex allergy status: Secondary | ICD-10-CM | POA: Insufficient documentation

## 2019-04-23 DIAGNOSIS — Z8249 Family history of ischemic heart disease and other diseases of the circulatory system: Secondary | ICD-10-CM | POA: Insufficient documentation

## 2019-04-23 DIAGNOSIS — Z955 Presence of coronary angioplasty implant and graft: Secondary | ICD-10-CM | POA: Insufficient documentation

## 2019-04-23 DIAGNOSIS — Z79899 Other long term (current) drug therapy: Secondary | ICD-10-CM | POA: Diagnosis not present

## 2019-04-23 DIAGNOSIS — Z820 Family history of epilepsy and other diseases of the nervous system: Secondary | ICD-10-CM | POA: Insufficient documentation

## 2019-04-23 DIAGNOSIS — Z853 Personal history of malignant neoplasm of breast: Secondary | ICD-10-CM | POA: Insufficient documentation

## 2019-04-23 DIAGNOSIS — Z803 Family history of malignant neoplasm of breast: Secondary | ICD-10-CM | POA: Insufficient documentation

## 2019-04-23 DIAGNOSIS — I350 Nonrheumatic aortic (valve) stenosis: Secondary | ICD-10-CM | POA: Diagnosis not present

## 2019-04-23 DIAGNOSIS — I10 Essential (primary) hypertension: Secondary | ICD-10-CM | POA: Insufficient documentation

## 2019-04-23 DIAGNOSIS — E785 Hyperlipidemia, unspecified: Secondary | ICD-10-CM | POA: Diagnosis not present

## 2019-04-23 DIAGNOSIS — E669 Obesity, unspecified: Secondary | ICD-10-CM | POA: Insufficient documentation

## 2019-04-23 DIAGNOSIS — Z833 Family history of diabetes mellitus: Secondary | ICD-10-CM | POA: Insufficient documentation

## 2019-04-23 DIAGNOSIS — M8580 Other specified disorders of bone density and structure, unspecified site: Secondary | ICD-10-CM | POA: Insufficient documentation

## 2019-04-23 DIAGNOSIS — Z8051 Family history of malignant neoplasm of kidney: Secondary | ICD-10-CM | POA: Insufficient documentation

## 2019-04-23 DIAGNOSIS — K219 Gastro-esophageal reflux disease without esophagitis: Secondary | ICD-10-CM | POA: Insufficient documentation

## 2019-04-23 DIAGNOSIS — I2510 Atherosclerotic heart disease of native coronary artery without angina pectoris: Secondary | ICD-10-CM | POA: Diagnosis not present

## 2019-04-23 HISTORY — PX: RIGHT/LEFT HEART CATH AND CORONARY ANGIOGRAPHY: CATH118266

## 2019-04-23 SURGERY — RIGHT/LEFT HEART CATH AND CORONARY ANGIOGRAPHY
Anesthesia: Moderate Sedation

## 2019-04-23 MED ORDER — HEPARIN (PORCINE) IN NACL 1000-0.9 UT/500ML-% IV SOLN
INTRAVENOUS | Status: DC | PRN
  Administered 2019-04-23: 500 mL

## 2019-04-23 MED ORDER — SODIUM CHLORIDE 0.9 % WEIGHT BASED INFUSION
3.0000 mL/kg/h | INTRAVENOUS | Status: AC
  Administered 2019-04-23: 3 mL/kg/h via INTRAVENOUS

## 2019-04-23 MED ORDER — SODIUM CHLORIDE 0.9 % WEIGHT BASED INFUSION: 1.0000 mL/kg/h | INTRAVENOUS | Status: DC

## 2019-04-23 MED ORDER — ASPIRIN 81 MG PO CHEW: 81.0000 mg | CHEWABLE_TABLET | ORAL | Status: DC

## 2019-04-23 MED ORDER — SODIUM CHLORIDE 0.9 % IV SOLN: 250.0000 mL | INTRAVENOUS | Status: DC | PRN

## 2019-04-23 MED ORDER — SODIUM CHLORIDE 0.9% FLUSH: 3.0000 mL | Freq: Two times a day (BID) | INTRAVENOUS | Status: DC

## 2019-04-23 MED ORDER — IOHEXOL 300 MG/ML  SOLN
INTRAMUSCULAR | Status: DC | PRN
  Administered 2019-04-23: 09:00:00 30 mL via INTRA_ARTERIAL

## 2019-04-23 MED ORDER — FENTANYL CITRATE (PF) 100 MCG/2ML IJ SOLN
INTRAMUSCULAR | Status: AC
  Filled 2019-04-23: qty 2

## 2019-04-23 MED ORDER — SODIUM CHLORIDE 0.9% FLUSH: 3.0000 mL | INTRAVENOUS | Status: DC | PRN

## 2019-04-23 MED ORDER — IOPAMIDOL (ISOVUE-300) INJECTION 61%
INTRAVENOUS | Status: DC | PRN
  Administered 2019-04-23: 09:00:00 90 mL via INTRA_ARTERIAL

## 2019-04-23 MED ORDER — FENTANYL CITRATE (PF) 100 MCG/2ML IJ SOLN
INTRAMUSCULAR | Status: DC | PRN
  Administered 2019-04-23: 25 ug via INTRAVENOUS

## 2019-04-23 MED ORDER — MIDAZOLAM HCL 2 MG/2ML IJ SOLN
INTRAMUSCULAR | Status: DC | PRN
  Administered 2019-04-23: 1 mg via INTRAVENOUS

## 2019-04-23 MED ORDER — LABETALOL HCL 5 MG/ML IV SOLN
INTRAVENOUS | Status: DC | PRN
  Administered 2019-04-23: 20 mg via INTRAVENOUS

## 2019-04-23 MED ORDER — MIDAZOLAM HCL 2 MG/2ML IJ SOLN
INTRAMUSCULAR | Status: AC
  Filled 2019-04-23: qty 2

## 2019-04-23 MED ORDER — LABETALOL HCL 5 MG/ML IV SOLN
INTRAVENOUS | Status: AC
  Filled 2019-04-23: qty 4

## 2019-04-23 MED ORDER — HEPARIN (PORCINE) IN NACL 1000-0.9 UT/500ML-% IV SOLN
INTRAVENOUS | Status: AC
  Filled 2019-04-23: qty 1000

## 2019-04-23 SURGICAL SUPPLY — 14 items
CATH INFINITI 5FR ANG PIGTAIL (CATHETERS) ×3 IMPLANT
CATH INFINITI 5FR JL4 (CATHETERS) ×3 IMPLANT
CATH INFINITI JR4 5F (CATHETERS) ×3 IMPLANT
CATH SWANZ 7F THERMO (CATHETERS) ×3 IMPLANT
DEVICE CLOSURE MYNXGRIP 6/7F (Vascular Products) ×3 IMPLANT
KIT MANI 3VAL PERCEP (MISCELLANEOUS) ×3 IMPLANT
KIT RIGHT HEART (MISCELLANEOUS) ×6 IMPLANT
NEEDLE PERC 18GX7CM (NEEDLE) ×3 IMPLANT
PACK CARDIAC CATH (CUSTOM PROCEDURE TRAY) ×3 IMPLANT
SHEATH AVANTI 5FR X 11CM (SHEATH) IMPLANT
SHEATH AVANTI 6FR X 11CM (SHEATH) ×3 IMPLANT
SHEATH AVANTI 7FRX11 (SHEATH) ×3 IMPLANT
WIRE EMERALD ST .035X150CM (WIRE) ×3 IMPLANT
WIRE GUIDERIGHT .035X150 (WIRE) ×3 IMPLANT

## 2019-04-23 NOTE — Discharge Instructions (Signed)
DO NOT RESUME YOUR PLAVIX PER DR. Nehemiah Massed  Angiogram, Care After This sheet gives you information about how to care for yourself after your procedure. Your doctor may also give you more specific instructions. If you have problems or questions, contact your doctor. Follow these instructions at home: Insertion site care  Follow instructions from your doctor about how to take care of your long, thin tube (catheter) insertion area. Make sure you: ? Wash your hands with soap and water before you change your bandage (dressing). If you cannot use soap and water, use hand sanitizer. ? Change your bandage as told by your doctor. ? Leave stitches (sutures), skin glue, or skin tape (adhesive) strips in place. They may need to stay in place for 2 weeks or longer. If tape strips get loose and curl up, you may trim the loose edges. Do not remove tape strips completely unless your doctor says it is okay.  Do not take baths, swim, or use a hot tub until your doctor says it is okay.  You may shower 24-48 hours after the procedure or as told by your doctor. ? Gently wash the area with plain soap and water. ? Pat the area dry with a clean towel. ? Do not rub the area. This may cause bleeding.  Do not apply powder or lotion to the area. Keep the area clean and dry.  Check your insertion area every day for signs of infection. Check for: ? More redness, swelling, or pain. ? Fluid or blood. ? Warmth. ? Pus or a bad smell. Activity  Rest as told by your doctor, usually for 1-2 days.  Do not lift anything that is heavier than 10 lbs. (4.5 kg) or as told by your doctor.  Do not drive for 24 hours if you were given a medicine to help you relax (sedative).  Do not drive or use heavy machinery while taking prescription pain medicine. General instructions   Go back to your normal activities as told by your doctor, usually in about a week. Ask your doctor what activities are safe for you.  If the insertion  area starts to bleed, lie flat and put pressure on the area. If the bleeding does not stop, get help right away. This is an emergency.  Drink enough fluid to keep your pee (urine) clear or pale yellow.  Take over-the-counter and prescription medicines only as told by your doctor.  Keep all follow-up visits as told by your doctor. This is important. Contact a doctor if:  You have a fever.  You have chills.  You have more redness, swelling, or pain around your insertion area.  You have fluid or blood coming from your insertion area.  The insertion area feels warm to the touch.  You have pus or a bad smell coming from your insertion area.  You have more bruising around the insertion area.  Blood collects in the tissue around the insertion area (hematoma) that may be painful to the touch. Get help right away if:  You have a lot of pain in the insertion area.  The insertion area swells very fast.  The insertion area is bleeding, and the bleeding does not stop after holding steady pressure on the area.  The area near or just beyond the insertion area becomes pale, cool, tingly, or numb. These symptoms may be an emergency. Do not wait to see if the symptoms will go away. Get medical help right away. Call your local emergency services (911 in  the [place] Do not drive yourself to the hospital. Summary  After the procedure, it is common to have bruising and tenderness at the long, thin tube insertion area.  After the procedure, it is important to rest and drink plenty of fluids.  Do not take baths, swim, or use a hot tub until your doctor says it is okay to do so. You may shower 24-48 hours after the procedure or as told by your doctor.  If the insertion area starts to bleed, lie flat and put pressure on the area. If the bleeding does not stop, get help right away. This is an emergency. This information is not intended to replace advice given to you by your health care provider. Make  sure you discuss any questions you have with your health care provider. Document Released: 03/08/2009 Document Revised: 12/04/2016 Document Reviewed: 12/04/2016 Elsevier Interactive Patient Education  2019 [employer]. Moderate Conscious Sedation, Adult, Care After These instructions provide you with information about caring for yourself after your procedure. Your health care provider may also give you more specific instructions. Your treatment has been planned according to current medical practices, but problems sometimes occur. Call your health care provider if you have any problems or questions after your procedure. What can I expect after the procedure? After your procedure, it is common:  To feel sleepy for several hours.  To feel clumsy and have poor balance for several hours.  To have poor judgment for several hours.  To vomit if you eat too soon. Follow these instructions at home: For at least 24 hours after the procedure:   Do not: ? Participate in activities where you could fall or become injured. ? Drive. ? Use heavy machinery. ? Drink alcohol. ? Take sleeping pills or medicines that cause drowsiness. ? Make important decisions or sign legal documents. ? Take care of children on your own.  Rest. Eating and drinking  Follow the diet recommended by your health care provider.  If you vomit: ? Drink water, juice, or soup when you can drink without vomiting. ? Make sure you have little or no nausea before eating solid foods. General instructions  Have a responsible adult stay with you until you are awake and alert.  Take over-the-counter and prescription medicines only as told by your health care provider.  If you smoke, do not smoke without supervision.  Keep all follow-up visits as told by your health care provider. This is important. Contact a health care provider if:  You keep feeling nauseous or you keep vomiting.  You feel light-headed.  You develop a  rash.  You have a fever. Get help right away if:  You have trouble breathing. This information is not intended to replace advice given to you by your health care provider. Make sure you discuss any questions you have with your health care provider. Document Released: 09/30/2013 Document Revised: 05/14/2016 Document Reviewed: 03/31/2016 Elsevier Interactive Patient Education  2019 [employer].

## 2019-05-14 ENCOUNTER — Ambulatory Visit: Payer: Medicare Other | Admitting: Podiatry

## 2019-07-07 ENCOUNTER — Other Ambulatory Visit: Payer: Self-pay

## 2019-07-07 ENCOUNTER — Ambulatory Visit
Admission: RE | Admit: 2019-07-07 | Discharge: 2019-07-07 | Disposition: A | Discharge status: Home / Self Care | Payer: Medicare Other | Source: Ambulatory Visit | Attending: General Surgery | Admitting: General Surgery

## 2019-07-07 DIAGNOSIS — Z1231 Encounter for screening mammogram for malignant neoplasm of breast: Secondary | ICD-10-CM | POA: Diagnosis present

## 2019-07-09 ENCOUNTER — Encounter: Payer: Self-pay | Admitting: Podiatry

## 2019-07-09 ENCOUNTER — Other Ambulatory Visit: Payer: Self-pay

## 2019-07-09 ENCOUNTER — Ambulatory Visit (INDEPENDENT_AMBULATORY_CARE_PROVIDER_SITE_OTHER): Payer: Medicare Other | Admitting: Podiatry

## 2019-07-09 VITALS — Temp 97.1°F

## 2019-07-09 DIAGNOSIS — M79676 Pain in unspecified toe(s): Secondary | ICD-10-CM | POA: Diagnosis not present

## 2019-07-09 DIAGNOSIS — D689 Coagulation defect, unspecified: Secondary | ICD-10-CM

## 2019-07-09 DIAGNOSIS — B351 Tinea unguium: Secondary | ICD-10-CM | POA: Diagnosis not present

## 2019-07-09 NOTE — Progress Notes (Signed)
Complaint:  Visit Type: Patient returns to my office for continued preventative foot care services. Complaint: Patient states" my nails have grown long and thick and become painful to walk and wear shoes"  The patient presents for preventative foot care services. No changes to ROS.  Patient is taking plavix.  Podiatric Exam: Vascular: dorsalis pedis and posterior tibial pulses are palpable bilateral. Capillary return is immediate. Temperature gradient is WNL. Skin turgor WNL  Sensorium: Normal Semmes Weinstein monofilament test. Normal tactile sensation bilaterally. Nail Exam: Pt has thick disfigured discolored nails with subungual debris noted bilateral entire nail hallux through fifth toenails Ulcer Exam: There is no evidence of ulcer or pre-ulcerative changes or infection. Orthopedic Exam: Muscle tone and strength are WNL. No limitations in general ROM. No crepitus or effusions noted. Foot type and digits show no abnormalities. Bony prominences are unremarkable. Skin: No Porokeratosis. No infection or ulcers  Diagnosis:  Onychomycosis, , Pain in right toe, pain in left toes  Treatment & Plan Procedures and Treatment: Consent by patient was obtained for treatment procedures. The patient understood the discussion of treatment and procedures well. All questions were answered thoroughly reviewed. Debridement of mycotic and hypertrophic toenails, 1 through 5 bilateral and clearing of subungual debris. No ulceration, no infection noted.  Return Visit-Office Procedure: Patient instructed to return to the office for a follow up visit 3 months for continued evaluation and treatment.    Gardiner Barefoot DPM

## 2019-07-20 ENCOUNTER — Encounter: Payer: Self-pay | Admitting: *Deleted

## 2019-07-20 ENCOUNTER — Encounter: Payer: Medicare Other | Attending: Internal Medicine | Admitting: *Deleted

## 2019-07-20 ENCOUNTER — Other Ambulatory Visit: Payer: Self-pay

## 2019-07-20 DIAGNOSIS — N189 Chronic kidney disease, unspecified: Secondary | ICD-10-CM | POA: Insufficient documentation

## 2019-07-20 DIAGNOSIS — Z79899 Other long term (current) drug therapy: Secondary | ICD-10-CM | POA: Insufficient documentation

## 2019-07-20 DIAGNOSIS — I890 Lymphedema, not elsewhere classified: Secondary | ICD-10-CM | POA: Insufficient documentation

## 2019-07-20 DIAGNOSIS — M1990 Unspecified osteoarthritis, unspecified site: Secondary | ICD-10-CM | POA: Insufficient documentation

## 2019-07-20 DIAGNOSIS — Z7902 Long term (current) use of antithrombotics/antiplatelets: Secondary | ICD-10-CM | POA: Insufficient documentation

## 2019-07-20 DIAGNOSIS — Z7901 Long term (current) use of anticoagulants: Secondary | ICD-10-CM | POA: Insufficient documentation

## 2019-07-20 DIAGNOSIS — Z7982 Long term (current) use of aspirin: Secondary | ICD-10-CM | POA: Insufficient documentation

## 2019-07-20 DIAGNOSIS — I252 Old myocardial infarction: Secondary | ICD-10-CM | POA: Insufficient documentation

## 2019-07-20 DIAGNOSIS — R011 Cardiac murmur, unspecified: Secondary | ICD-10-CM | POA: Insufficient documentation

## 2019-07-20 DIAGNOSIS — Z8673 Personal history of transient ischemic attack (TIA), and cerebral infarction without residual deficits: Secondary | ICD-10-CM | POA: Insufficient documentation

## 2019-07-20 DIAGNOSIS — Z87891 Personal history of nicotine dependence: Secondary | ICD-10-CM | POA: Insufficient documentation

## 2019-07-20 DIAGNOSIS — Z952 Presence of prosthetic heart valve: Secondary | ICD-10-CM

## 2019-07-20 DIAGNOSIS — M810 Age-related osteoporosis without current pathological fracture: Secondary | ICD-10-CM | POA: Insufficient documentation

## 2019-07-20 DIAGNOSIS — I129 Hypertensive chronic kidney disease with stage 1 through stage 4 chronic kidney disease, or unspecified chronic kidney disease: Secondary | ICD-10-CM | POA: Insufficient documentation

## 2019-07-20 NOTE — Progress Notes (Signed)
Virtual Orientation completed today. Hailey Ellison attended the program in 2017 . She is ready to start again.

## 2019-07-21 ENCOUNTER — Ambulatory Visit: Payer: Medicare Other | Admitting: General Surgery

## 2019-07-22 ENCOUNTER — Other Ambulatory Visit: Payer: Self-pay

## 2019-07-22 ENCOUNTER — Encounter: Payer: Medicare Other | Admitting: *Deleted

## 2019-07-22 VITALS — Ht 64.4 in | Wt 179.8 lb

## 2019-07-22 DIAGNOSIS — M810 Age-related osteoporosis without current pathological fracture: Secondary | ICD-10-CM | POA: Diagnosis not present

## 2019-07-22 DIAGNOSIS — R011 Cardiac murmur, unspecified: Secondary | ICD-10-CM | POA: Diagnosis not present

## 2019-07-22 DIAGNOSIS — Z7982 Long term (current) use of aspirin: Secondary | ICD-10-CM | POA: Diagnosis not present

## 2019-07-22 DIAGNOSIS — Z87891 Personal history of nicotine dependence: Secondary | ICD-10-CM | POA: Diagnosis not present

## 2019-07-22 DIAGNOSIS — N189 Chronic kidney disease, unspecified: Secondary | ICD-10-CM | POA: Diagnosis not present

## 2019-07-22 DIAGNOSIS — Z8673 Personal history of transient ischemic attack (TIA), and cerebral infarction without residual deficits: Secondary | ICD-10-CM | POA: Diagnosis not present

## 2019-07-22 DIAGNOSIS — I129 Hypertensive chronic kidney disease with stage 1 through stage 4 chronic kidney disease, or unspecified chronic kidney disease: Secondary | ICD-10-CM | POA: Diagnosis not present

## 2019-07-22 DIAGNOSIS — I890 Lymphedema, not elsewhere classified: Secondary | ICD-10-CM | POA: Diagnosis not present

## 2019-07-22 DIAGNOSIS — Z952 Presence of prosthetic heart valve: Secondary | ICD-10-CM

## 2019-07-22 DIAGNOSIS — Z79899 Other long term (current) drug therapy: Secondary | ICD-10-CM | POA: Diagnosis not present

## 2019-07-22 DIAGNOSIS — I252 Old myocardial infarction: Secondary | ICD-10-CM | POA: Diagnosis not present

## 2019-07-22 DIAGNOSIS — Z7902 Long term (current) use of antithrombotics/antiplatelets: Secondary | ICD-10-CM | POA: Diagnosis not present

## 2019-07-22 DIAGNOSIS — M1990 Unspecified osteoarthritis, unspecified site: Secondary | ICD-10-CM | POA: Diagnosis not present

## 2019-07-22 DIAGNOSIS — Z7901 Long term (current) use of anticoagulants: Secondary | ICD-10-CM | POA: Diagnosis not present

## 2019-07-22 NOTE — Progress Notes (Signed)
Cardiac Individual Treatment Plan  Patient Details  Name: Hailey Ellison MRN: [id number] Date of Birth: 1935-01-03 Referring Provider:     Cardiac Rehab from 07/22/2019 in [facility] Cardiac and Pulmonary Rehab  Referring Provider  Serafina Royals MD      Initial Encounter Date:    Cardiac Rehab from 07/22/2019 in [facility] Cardiac and Pulmonary Rehab  Date  07/22/19      Visit Diagnosis: S/P TAVR (transcatheter aortic valve replacement)  Patient's Home Medications on Admission:  Current Outpatient Medications:  .  acetaminophen (TYLENOL) 325 MG tablet, Take by mouth., Disp: , Rfl:  .  amLODipine (NORVASC) 5 MG tablet, Take by mouth., Disp: , Rfl:  .  anastrozole (ARIMIDEX) 1 MG tablet, TAKE 1 TABLET DAILY (Patient taking differently: Take 1 mg by mouth daily. ), Disp: 90 tablet, Rfl: 3 .  aspirin 81 MG chewable tablet, Chew 1 tablet (81 mg total) by mouth daily., Disp: 30 tablet, Rfl: 2 .  calcium citrate-vitamin D (CITRACAL+D) 315-200 MG-UNIT per tablet, Take 1 tablet by mouth 2 (two) times daily., Disp: , Rfl:  .  clopidogrel (PLAVIX) 75 MG tablet, Take by mouth., Disp: , Rfl:  .  ergocalciferol (VITAMIN D2) 50000 units capsule, Take 50,000 Units by mouth every 30 (thirty) days. , Disp: , Rfl:  .  furosemide (LASIX) 20 MG tablet, Take 20 mg by mouth daily as needed for fluid. , Disp: , Rfl:  .  gabapentin (NEURONTIN) 300 MG capsule, 2 Capsules in the am and 3 capsules at night, Disp: , Rfl:  .  Glucosamine-Chondroit-Vit C-Mn (GLUCOSAMINE 1500 COMPLEX PO), Take 2 tablets by mouth daily., Disp: , Rfl:  .  lansoprazole (PREVACID) 30 MG capsule, Take 30 mg by mouth daily., Disp: , Rfl:  .  losartan (COZAAR) 50 MG tablet, Take 50 mg by mouth daily., Disp: , Rfl:  .  Multiple Vitamin (MULTI-VITAMIN) tablet, Take by mouth., Disp: , Rfl:  .  Multiple Vitamin (MULTIVITAMIN WITH MINERALS) TABS, Take 1 tablet by mouth daily., Disp: , Rfl:  .  nystatin cream (MYCOSTATIN), Apply topically., Disp: , Rfl:   .  PARoxetine (PAXIL) 40 MG tablet, Take 40 mg by mouth at bedtime. , Disp: , Rfl:  .  rosuvastatin (CRESTOR) 20 MG tablet, Take 20 mg by mouth daily., Disp: , Rfl:  .  senna-docusate (SENOKOT-S) 8.6-50 MG tablet, Take by mouth., Disp: , Rfl:  .  triamcinolone cream (KENALOG) 0.5 %, Apply topically., Disp: , Rfl:  .  vitamin B-12 (CYANOCOBALAMIN) 500 MCG tablet, Take 500 mcg by mouth daily., Disp: , Rfl:   Past Medical History: Past Medical History:  Diagnosis Date  . Anxiety   . Arthritis   . Breast cancer ([place]) 2010   Adjuvant chemotherapy with Taxotere/Carboplatin/Herceptin. Arimidex initiated November 2011  . Cancer ([place]) 2010   T1c,N2a, M0; ER 90%, PR 60%,  HER-2/neu 2+ IHC, equivocal on fish. 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  Margins clear.  . Chronic kidney disease 2014  . Depression   . GERD (gastroesophageal reflux disease)   . Heart attack ([place]) 06/09/2016  . Hypertension   . Lymphedema    left arm  . Murmur   . Osteoporosis   . Personal history of chemotherapy   . Radiation 2010   with chemo  . Squamous cell carcinoma June 2016   Left upper inner chest wall, area of previous  radiation.  . Stroke ([place])    per pt, TIA a long time ago.  . Tinnitus     Tobacco Use: Social History   Tobacco Use  Smoking Status Former Smoker  . Types: Cigarettes  Smokeless Tobacco Never Used  Tobacco Comment   only smoked a few cigs throughout her lifetime    Labs: Recent Review Flowsheet Data    Labs for ITP Cardiac and Pulmonary Rehab Latest Ref Rng & Units 06/08/2016   Cholestrol 0 - 200 mg/dL 135   LDLCALC 0 - 99 mg/dL 81   HDL >40 mg/dL 47   Trlycerides <150 mg/dL 37       Exercise Target Goals: Exercise Program Goal: Individual exercise prescription set using results from initial 6 min walk test and THRR while considering  patient's  activity barriers and safety.   Exercise Prescription Goal: Initial exercise prescription builds to 30-45 minutes a day of aerobic activity, 2-3 days per week.  Home exercise guidelines will be given to patient during program as part of exercise prescription that the participant will acknowledge.  Activity Barriers & Risk Stratification: Activity Barriers & Cardiac Risk Stratification - 07/22/19 1428      Activity Barriers & Cardiac Risk Stratification   Activity Barriers  Shortness of Breath;Arthritis;Balance Concerns;Muscular Weakness;Deconditioning;Assistive Device;Left Knee Replacement    Cardiac Risk Stratification  High       6 Minute Walk: 6 Minute Walk    Row Name 07/22/19 1418         6 Minute Walk   Phase  Initial     Distance  830 feet     Walk Time  6 minutes     # of Rest Breaks  0     MPH  1.57     METS  1.36     RPE  11     Perceived Dyspnea   1     VO2 Peak  4.77     Symptoms  No     Resting HR  68 bpm     Resting BP  124/70     Resting Oxygen Saturation   97 %     Exercise Oxygen Saturation  during 6 min walk  98 %     Max Ex. HR  93 bpm     Max Ex. BP  154/74     2 Minute Post BP  130/64        Oxygen Initial Assessment:   Oxygen Re-Evaluation:   Oxygen Discharge (Final Oxygen Re-Evaluation):   Initial Exercise Prescription: Initial Exercise Prescription - 07/22/19 1400      Date of Initial Exercise RX and Referring Provider   Date  07/22/19    Referring Provider  Serafina Royals MD      NuStep   Level  1    SPM  80    Minutes  15    METs  1.5      Arm Ergometer   Level  1    RPM  50    Minutes  15    METs  1.5      Recumbant Elliptical   Level  1    RPM  50    Minutes  15    METs  1.5      Biostep-RELP   Level  1    SPM  50    Minutes  15    METs  1      Prescription Details   Frequency (times  per week)  2    Duration  Progress to 30 minutes of continuous aerobic without signs/symptoms of physical distress       Intensity   THRR 40-80% of Max Heartrate  96-125    Ratings of Perceived Exertion  11-13    Perceived Dyspnea  0-4      Progression   Progression  Continue to progress workloads to maintain intensity without signs/symptoms of physical distress.      Resistance Training   Training Prescription  Yes    Weight  3 lbs    Reps  10-15       Perform Capillary Blood Glucose checks as needed.  Exercise Prescription Changes: Exercise Prescription Changes    Row Name 07/22/19 1400             Response to Exercise   Blood Pressure (Admit)  124/70       Blood Pressure (Exercise)  154/74       Blood Pressure (Exit)  130/64       Heart Rate (Admit)  68 bpm       Heart Rate (Exercise)  93 bpm       Heart Rate (Exit)  69 bpm       Oxygen Saturation (Admit)  97 %       Oxygen Saturation (Exercise)  98 %       Rating of Perceived Exertion (Exercise)  11       Perceived Dyspnea (Exercise)  1       Symptoms  wobbly at end, fatigued, SOB       Comments  walk test results          Exercise Comments:   Exercise Goals and Review: Exercise Goals    Row Name 07/22/19 1437             Exercise Goals   Increase Physical Activity  Yes       Intervention  Provide advice, education, support and counseling about physical activity/exercise needs.;Develop an individualized exercise prescription for aerobic and resistive training based on initial evaluation findings, risk stratification, comorbidities and participant's personal goals.       Expected Outcomes  Short Term: Attend rehab on a regular basis to increase amount of physical activity.;Long Term: Add in home exercise to make exercise part of routine and to increase amount of physical activity.;Long Term: Exercising regularly at least 3-5 days a week.       Increase Strength and Stamina  Yes       Intervention  Provide advice, education, support and counseling about physical activity/exercise needs.;Develop an individualized exercise  prescription for aerobic and resistive training based on initial evaluation findings, risk stratification, comorbidities and participant's personal goals.       Expected Outcomes  Short Term: Increase workloads from initial exercise prescription for resistance, speed, and METs.;Short Term: Perform resistance training exercises routinely during rehab and add in resistance training at home;Long Term: Improve cardiorespiratory fitness, muscular endurance and strength as measured by increased METs and functional capacity (6MWT)       Able to understand and use rate of perceived exertion (RPE) scale  Yes       Intervention  Provide education and explanation on how to use RPE scale       Expected Outcomes  Short Term: Able to use RPE daily in rehab to express subjective intensity level;Long Term:  Able to use RPE to guide intensity level when exercising independently  Able to understand and use Dyspnea scale  Yes       Intervention  Provide education and explanation on how to use Dyspnea scale       Expected Outcomes  Short Term: Able to use Dyspnea scale daily in rehab to express subjective sense of shortness of breath during exertion;Long Term: Able to use Dyspnea scale to guide intensity level when exercising independently       Knowledge and understanding of Target Heart Rate Range (THRR)  Yes       Intervention  Provide education and explanation of THRR including how the numbers were predicted and where they are located for reference       Expected Outcomes  Short Term: Able to state/look up THRR;Short Term: Able to use daily as guideline for intensity in rehab;Long Term: Able to use THRR to govern intensity when exercising independently       Able to check pulse independently  Yes       Intervention  Provide education and demonstration on how to check pulse in carotid and radial arteries.;Review the importance of being able to check your own pulse for safety during independent exercise        Expected Outcomes  Short Term: Able to explain why pulse checking is important during independent exercise;Long Term: Able to check pulse independently and accurately       Understanding of Exercise Prescription  Yes       Intervention  Provide education, explanation, and written materials on patient's individual exercise prescription       Expected Outcomes  Short Term: Able to explain program exercise prescription;Long Term: Able to explain home exercise prescription to exercise independently          Exercise Goals Re-Evaluation :   Discharge Exercise Prescription (Final Exercise Prescription Changes): Exercise Prescription Changes - 07/22/19 1400      Response to Exercise   Blood Pressure (Admit)  124/70    Blood Pressure (Exercise)  154/74    Blood Pressure (Exit)  130/64    Heart Rate (Admit)  68 bpm    Heart Rate (Exercise)  93 bpm    Heart Rate (Exit)  69 bpm    Oxygen Saturation (Admit)  97 %    Oxygen Saturation (Exercise)  98 %    Rating of Perceived Exertion (Exercise)  11    Perceived Dyspnea (Exercise)  1    Symptoms  wobbly at end, fatigued, SOB    Comments  walk test results       Nutrition:  Target Goals: Understanding of nutrition guidelines, daily intake of sodium <1547m, cholesterol <2080m calories 30% from fat and 7% or less from saturated fats, daily to have 5 or more servings of fruits and vegetables.  Biometrics: Pre Biometrics - 07/22/19 1438      Pre Biometrics   Height  5' 4.4" (1.636 m)    Weight  179 lb 12.8 oz (81.6 kg)    BMI (Calculated)  30.47    Single Leg Stand  0.69 seconds        Nutrition Therapy Plan and Nutrition Goals: Nutrition Therapy & Goals - 07/22/19 1419      Nutrition Therapy   Diet  low na, HH, renal diet    Drug/Food Interactions  Statins/Certain Fruits   crestor   Protein (specify units)  66g    Fiber  25 grams    Whole Grain Foods  3 servings    Saturated Fats  12 max.  grams    Fruits and Vegetables  5  servings/day    Sodium  1.5 grams      Personal Nutrition Goals   Nutrition Goal  ST: explore resources to determine what she would want to change LT: eat healthier, walk better    Comments  Pt on lasix, CKD stg 4, senokot, B12, Ca with vit D for osteopenia. Pt reports eating breakfast biscuit with sausage and cheese daily for B. Will have ice cream or chips and maybe a peanut butter and jelly sandwich later. Pt reports tryin to eat 1 whole egg everyday but is not doing that yet. Will sometimes have fried chciken sandwich or tenders. Pt reports not cooking.  Encouraged pt to increase protein. Discussed HH, renal, and low Na eating. Talked about healthy snack options.      Intervention Plan   Intervention  Prescribe, educate and counsel regarding individualized specific dietary modifications aiming towards targeted core components such as weight, hypertension, lipid management, diabetes, heart failure and other comorbidities.;Nutrition handout(s) given to patient.   HH, low Na, renal   Expected Outcomes  Short Term Goal: Understand basic principles of dietary content, such as calories, fat, sodium, cholesterol and nutrients.;Short Term Goal: A plan has been developed with personal nutrition goals set during dietitian appointment.;Long Term Goal: Adherence to prescribed nutrition plan.       Nutrition Assessments: Nutrition Assessments - 07/22/19 1426      Rate Your Plate Scores   Pre Score  --      MEDFICTS Scores   Pre Score  52       Nutrition Goals Re-Evaluation:   Nutrition Goals Discharge (Final Nutrition Goals Re-Evaluation):   Psychosocial: Target Goals: Acknowledge presence or absence of significant depression and/or stress, maximize coping skills, provide positive support system. Participant is able to verbalize types and ability to use techniques and skills needed for reducing stress and depression.   Initial Review & Psychosocial Screening: Initial Psych Review &  Screening - 07/20/19 1412      Initial Review   Current issues with  None Identified      Family Dynamics   Good Support System?  Yes   Daughters     Barriers   Psychosocial barriers to participate in program  There are no identifiable barriers or psychosocial needs.      Screening Interventions   Interventions  Encouraged to exercise;Provide feedback about the scores to participant;To provide support and resources with identified psychosocial needs    Expected Outcomes  Short Term goal: Utilizing psychosocial counselor, staff and physician to assist with identification of specific Stressors or current issues interfering with healing process. Setting desired goal for each stressor or current issue identified.;Long Term Goal: Stressors or current issues are controlled or eliminated.;Short Term goal: Identification and review with participant of any Quality of Life or Depression concerns found by scoring the questionnaire.;Long Term goal: The participant improves quality of Life and PHQ9 Scores as seen by post scores and/or verbalization of changes       Quality of Life Scores:  Quality of Life - 07/22/19 1447      Quality of Life   Select  Quality of Life      Quality of Life Scores   Health/Function Pre  20.46 %    Socioeconomic Pre  25 %    Psych/Spiritual Pre  25.71 %    Family Pre  25.5 %    GLOBAL Pre  23.18 %  Scores of 19 and below usually indicate a poorer quality of life in these areas.  A difference of  2-3 points is a clinically meaningful difference.  A difference of 2-3 points in the total score of the Quality of Life Index has been associated with significant improvement in overall quality of life, self-image, physical symptoms, and general health in studies assessing change in quality of life.  PHQ-9: Recent Review Flowsheet Data    Depression screen [facility] 2/9 07/22/2019 06/25/2016   Decreased Interest 2 1   Down, Depressed, Hopeless 1 0   PHQ - 2 Score 3 1    Altered sleeping 0 1   Tired, decreased energy 1 2   Change in appetite 1 3   Feeling bad or failure about yourself  0 0   Trouble concentrating 0 0   Moving slowly or fidgety/restless 0 0   Suicidal thoughts 0 -   PHQ-9 Score 5 7   Difficult doing work/chores Somewhat difficult Somewhat difficult     Interpretation of Total Score  Total Score Depression Severity:  1-4 = Minimal depression, 5-9 = Mild depression, 10-14 = Moderate depression, 15-19 = Moderately severe depression, 20-27 = Severe depression   Psychosocial Evaluation and Intervention:   Psychosocial Re-Evaluation:   Psychosocial Discharge (Final Psychosocial Re-Evaluation):   Vocational Rehabilitation: Provide vocational rehab assistance to qualifying candidates.   Vocational Rehab Evaluation & Intervention:   Education: Education Goals: Education classes will be provided on a variety of topics geared toward better understanding of heart health and risk factor modification. Participant will state understanding/return demonstration of topics presented as noted by education test scores.  Learning Barriers/Preferences: Learning Barriers/Preferences - 07/20/19 1414      Learning Barriers/Preferences   Learning Barriers  None   Eye surgeries  First eye due end of August. Glasses not working well presently for reading   Gaffer Material       Education Topics:  AED/CPR: - Group verbal and written instruction with the use of models to demonstrate the basic use of the AED with the basic ABC's of resuscitation.   General Nutrition Guidelines/Fats and Fiber: -Group instruction provided by verbal, written material, models and posters to present the general guidelines for heart healthy nutrition. Gives an explanation and review of dietary fats and fiber.   Controlling Sodium/Reading Food Labels: -Group verbal and written material supporting the discussion of sodium use in heart healthy  nutrition. Review and explanation with models, verbal and written materials for utilization of the food label.   Exercise Physiology & General Exercise Guidelines: - Group verbal and written instruction with models to review the exercise physiology of the cardiovascular system and associated critical values. Provides general exercise guidelines with specific guidelines to those with heart or lung disease.    Aerobic Exercise & Resistance Training: - Gives group verbal and written instruction on the various components of exercise. Focuses on aerobic and resistive training programs and the benefits of this training and how to safely progress through these programs..   Flexibility, Balance, Mind/Body Relaxation: Provides group verbal/written instruction on the benefits of flexibility and balance training, including mind/body exercise modes such as yoga, pilates and tai chi.  Demonstration and skill practice provided.   Stress and Anxiety: - Provides group verbal and written instruction about the health risks of elevated stress and causes of high stress.  Discuss the correlation between heart/lung disease and anxiety and treatment options. Review healthy ways to manage with stress and anxiety.  Depression: - Provides group verbal and written instruction on the correlation between heart/lung disease and depressed mood, treatment options, and the stigmas associated with seeking treatment.   Anatomy & Physiology of the Heart: - Group verbal and written instruction and models provide basic cardiac anatomy and physiology, with the coronary electrical and arterial systems. Review of Valvular disease and Heart Failure   Cardiac Rehab from 08/13/2016 in [facility] Cardiac and Pulmonary Rehab  Date  08/13/16  Educator  CE  Instruction Review Code (retired)  2- meets goals/outcomes      Cardiac Procedures: - Group verbal and written instruction to review commonly prescribed medications for heart  disease. Reviews the medication, class of the drug, and side effects. Includes the steps to properly store meds and maintain the prescription regimen. (beta blockers and nitrates)   Cardiac Medications I: - Group verbal and written instruction to review commonly prescribed medications for heart disease. Reviews the medication, class of the drug, and side effects. Includes the steps to properly store meds and maintain the prescription regimen.   Cardiac Medications II: -Group verbal and written instruction to review commonly prescribed medications for heart disease. Reviews the medication, class of the drug, and side effects. (all other drug classes)    Go Sex-Intimacy & Heart Disease, Get SMART - Goal Setting: - Group verbal and written instruction through game format to discuss heart disease and the return to sexual intimacy. Provides group verbal and written material to discuss and apply goal setting through the application of the S.M.A.R.T. Method.   Other Matters of the Heart: - Provides group verbal, written materials and models to describe Stable Angina and Peripheral Artery. Includes description of the disease process and treatment options available to the cardiac patient.   Cardiac Rehab from 08/13/2016 in [facility] Cardiac and Pulmonary Rehab  Date  08/13/16  Educator  CE  Instruction Review Code (retired)  2- meets goals/outcomes      Exercise & Equipment Safety: - Individual verbal instruction and demonstration of equipment use and safety with use of the equipment.   Cardiac Rehab from 07/22/2019 in [facility] Cardiac and Pulmonary Rehab  Date  07/22/19  Educator  [facility]  Instruction Review Code  1- Verbalizes Understanding      Infection Prevention: - Provides verbal and written material to individual with discussion of infection control including proper hand washing and proper equipment cleaning during exercise session.   Cardiac Rehab from 07/22/2019 in [facility] Cardiac and Pulmonary  Rehab  Date  07/22/19  Educator  [facility]  Instruction Review Code  1- Verbalizes Understanding      Falls Prevention: - Provides verbal and written material to individual with discussion of falls prevention and safety.   Cardiac Rehab from 07/22/2019 in [facility] Cardiac and Pulmonary Rehab  Date  07/22/19  Educator  [facility]  Instruction Review Code  1- Verbalizes Understanding      Diabetes: - Individual verbal and written instruction to review signs/symptoms of diabetes, desired ranges of glucose level fasting, after meals and with exercise. Acknowledge that pre and post exercise glucose checks will be done for 3 sessions at entry of program.   Know Your Numbers and Risk Factors: -Group verbal and written instruction about important numbers in your health.  Discussion of what are risk factors and how they play a role in the disease process.  Review of Cholesterol, Blood Pressure, Diabetes, and BMI and the role they play in your overall health.   Sleep Hygiene: -Provides group verbal and written  instruction about how sleep can affect your health.  Define sleep hygiene, discuss sleep cycles and impact of sleep habits. Review good sleep hygiene tips.    Other: -Provides group and verbal instruction on various topics (see comments)   Knowledge Questionnaire Score: Knowledge Questionnaire Score - 07/22/19 1448      Knowledge Questionnaire Score   Pre Score  23/26   Education Focus: Angina, Nutrition      Core Components/Risk Factors/Patient Goals at Admission: Personal Goals and Risk Factors at Admission - 07/22/19 1438      Core Components/Risk Factors/Patient Goals on Admission    Weight Management  Yes;Weight Loss    Intervention  Weight Management: Develop a combined nutrition and exercise program designed to reach desired caloric intake, while maintaining appropriate intake of nutrient and fiber, sodium and fats, and appropriate energy expenditure required for the weight goal.;Weight  Management: Provide education and appropriate resources to help participant work on and attain dietary goals.    Admit Weight  179 lb 12.8 oz (81.6 kg)    Goal Weight: Short Term  175 lb (79.4 kg)    Goal Weight: Long Term  169 lb (76.7 kg)    Expected Outcomes  Short Term: Continue to assess and modify interventions until short term weight is achieved;Long Term: Adherence to nutrition and physical activity/exercise program aimed toward attainment of established weight goal;Weight Loss: Understanding of general recommendations for a balanced deficit meal plan, which promotes 1-2 lb weight loss per week and includes a negative energy balance of [phone] kcal/d;Understanding recommendations for meals to include 15-35% energy as protein, 25-35% energy from fat, 35-60% energy from carbohydrates, less than 229m of dietary cholesterol, 20-35 gm of total fiber daily;Understanding of distribution of calorie intake throughout the day with the consumption of 4-5 meals/snacks    Hypertension  Yes    Intervention  Provide education on lifestyle modifcations including regular physical activity/exercise, weight management, moderate sodium restriction and increased consumption of fresh fruit, vegetables, and low fat dairy, alcohol moderation, and smoking cessation.;Monitor prescription use compliance.    Expected Outcomes  Short Term: Continued assessment and intervention until BP is < 140/978mHG in hypertensive participants. < 130/8079mG in hypertensive participants with diabetes, heart failure or chronic kidney disease.;Long Term: Maintenance of blood pressure at goal levels.    Lipids  Yes    Intervention  Provide education and support for participant on nutrition & aerobic/resistive exercise along with prescribed medications to achieve LDL <9m33mDL >40mg53m Expected Outcomes  Short Term: Participant states understanding of desired cholesterol values and is compliant with medications prescribed. Participant is  following exercise prescription and nutrition guidelines.;Long Term: Cholesterol controlled with medications as prescribed, with individualized exercise RX and with personalized nutrition plan. Value goals: LDL < 9mg,5m > 40 mg.       Core Components/Risk Factors/Patient Goals Review:    Core Components/Risk Factors/Patient Goals at Discharge (Final Review):    ITP Comments: ITP Comments    Row Name 07/20/19 1405 07/22/19 1418         ITP Comments  Orientation Virtual Call completed today. EP/RD appt on 7/29.     Documentation of diagnosis can be found in [facility] Completed 6MWT, gym orientation, and RD eval today.  Initial ITP created and sent to Dr. Mark MEmily Filbertcal director for review.         Comments: Initial ITP

## 2019-07-22 NOTE — Patient Instructions (Signed)
Patient Instructions  Patient Details  Name: Hailey Ellison MRN: [id number] Date of Birth: [birth year] Referring Provider:  Corey Skains, MD  Below are your personal goals for exercise, nutrition, and risk factors. Our goal is to help you stay on track towards obtaining and maintaining these goals. We will be discussing your progress on these goals with you throughout the program.  Initial Exercise Prescription: Initial Exercise Prescription - 07/22/19 1400      Date of Initial Exercise RX and Referring Provider   Date  07/22/19    Referring Provider  Serafina Royals MD      NuStep   Level  1    SPM  80    Minutes  15    METs  1.5      Arm Ergometer   Level  1    RPM  50    Minutes  15    METs  1.5      Recumbant Elliptical   Level  1    RPM  50    Minutes  15    METs  1.5      Biostep-RELP   Level  1    SPM  50    Minutes  15    METs  1      Prescription Details   Frequency (times per week)  2    Duration  Progress to 30 minutes of continuous aerobic without signs/symptoms of physical distress      Intensity   THRR 40-80% of Max Heartrate  96-125    Ratings of Perceived Exertion  11-13    Perceived Dyspnea  0-4      Progression   Progression  Continue to progress workloads to maintain intensity without signs/symptoms of physical distress.      Resistance Training   Training Prescription  Yes    Weight  3 lbs    Reps  10-15       Exercise Goals: Frequency: Be able to perform aerobic exercise two to three times per week in program working toward 2-5 days per week of home exercise.  Intensity: Work with a perceived exertion of 11 (fairly light) - 15 (hard) while following your exercise prescription.  We will make changes to your prescription with you as you progress through the program.   Duration: Be able to do 30 to 45 minutes of continuous aerobic exercise in addition to a 5 minute warm-up and a 5 minute cool-down routine.   Nutrition Goals: Your  personal nutrition goals will be established when you do your nutrition analysis with the dietician.  The following are general nutrition guidelines to follow: Cholesterol < 200mg /day Sodium < 1500mg /day Fiber: Women over 50 yrs - 21 grams per day  Personal Goals: Personal Goals and Risk Factors at Admission - 07/22/19 1438      Core Components/Risk Factors/Patient Goals on Admission    Weight Management  Yes;Weight Loss    Intervention  Weight Management: Develop a combined nutrition and exercise program designed to reach desired caloric intake, while maintaining appropriate intake of nutrient and fiber, sodium and fats, and appropriate energy expenditure required for the weight goal.;Weight Management: Provide education and appropriate resources to help participant work on and attain dietary goals.    Admit Weight  179 lb 12.8 oz (81.6 kg)    Goal Weight: Short Term  175 lb (79.4 kg)    Goal Weight: Long Term  169 lb (76.7 kg)    Expected Outcomes  Short Term: Continue to assess and modify interventions until short term weight is achieved;Long Term: Adherence to nutrition and physical activity/exercise program aimed toward attainment of established weight goal;Weight Loss: Understanding of general recommendations for a balanced deficit meal plan, which promotes 1-2 lb weight loss per week and includes a negative energy balance of [phone] kcal/d;Understanding recommendations for meals to include 15-35% energy as protein, 25-35% energy from fat, 35-60% energy from carbohydrates, less than 200mg  of dietary cholesterol, 20-35 gm of total fiber daily;Understanding of distribution of calorie intake throughout the day with the consumption of 4-5 meals/snacks    Hypertension  Yes    Intervention  Provide education on lifestyle modifcations including regular physical activity/exercise, weight management, moderate sodium restriction and increased consumption of fresh fruit, vegetables, and low fat dairy,  alcohol moderation, and smoking cessation.;Monitor prescription use compliance.    Expected Outcomes  Short Term: Continued assessment and intervention until BP is < 140/1mm HG in hypertensive participants. < 130/45mm HG in hypertensive participants with diabetes, heart failure or chronic kidney disease.;Long Term: Maintenance of blood pressure at goal levels.    Lipids  Yes    Intervention  Provide education and support for participant on nutrition & aerobic/resistive exercise along with prescribed medications to achieve LDL 70mg , HDL >40mg .    Expected Outcomes  Short Term: Participant states understanding of desired cholesterol values and is compliant with medications prescribed. Participant is following exercise prescription and nutrition guidelines.;Long Term: Cholesterol controlled with medications as prescribed, with individualized exercise RX and with personalized nutrition plan. Value goals: LDL < 70mg , HDL > 40 mg.       Tobacco Use Initial Evaluation: Social History   Tobacco Use  Smoking Status Former Smoker  . Types: Cigarettes  Smokeless Tobacco Never Used  Tobacco Comment   only smoked a few cigs throughout her lifetime    Exercise Goals and Review: Exercise Goals    Row Name 07/22/19 1437             Exercise Goals   Increase Physical Activity  Yes       Intervention  Provide advice, education, support and counseling about physical activity/exercise needs.;Develop an individualized exercise prescription for aerobic and resistive training based on initial evaluation findings, risk stratification, comorbidities and participant's personal goals.       Expected Outcomes  Short Term: Attend rehab on a regular basis to increase amount of physical activity.;Long Term: Add in home exercise to make exercise part of routine and to increase amount of physical activity.;Long Term: Exercising regularly at least 3-5 days a week.       Increase Strength and Stamina  Yes        Intervention  Provide advice, education, support and counseling about physical activity/exercise needs.;Develop an individualized exercise prescription for aerobic and resistive training based on initial evaluation findings, risk stratification, comorbidities and participant's personal goals.       Expected Outcomes  Short Term: Increase workloads from initial exercise prescription for resistance, speed, and METs.;Short Term: Perform resistance training exercises routinely during rehab and add in resistance training at home;Long Term: Improve cardiorespiratory fitness, muscular endurance and strength as measured by increased METs and functional capacity (6MWT)       Able to understand and use rate of perceived exertion (RPE) scale  Yes       Intervention  Provide education and explanation on how to use RPE scale       Expected Outcomes  Short Term: Able to use  RPE daily in rehab to express subjective intensity level;Long Term:  Able to use RPE to guide intensity level when exercising independently       Able to understand and use Dyspnea scale  Yes       Intervention  Provide education and explanation on how to use Dyspnea scale       Expected Outcomes  Short Term: Able to use Dyspnea scale daily in rehab to express subjective sense of shortness of breath during exertion;Long Term: Able to use Dyspnea scale to guide intensity level when exercising independently       Knowledge and understanding of Target Heart Rate Range (THRR)  Yes       Intervention  Provide education and explanation of THRR including how the numbers were predicted and where they are located for reference       Expected Outcomes  Short Term: Able to state/look up THRR;Short Term: Able to use daily as guideline for intensity in rehab;Long Term: Able to use THRR to govern intensity when exercising independently       Able to check pulse independently  Yes       Intervention  Provide education and demonstration on how to check pulse in  carotid and radial arteries.;Review the importance of being able to check your own pulse for safety during independent exercise       Expected Outcomes  Short Term: Able to explain why pulse checking is important during independent exercise;Long Term: Able to check pulse independently and accurately       Understanding of Exercise Prescription  Yes       Intervention  Provide education, explanation, and written materials on patient's individual exercise prescription       Expected Outcomes  Short Term: Able to explain program exercise prescription;Long Term: Able to explain home exercise prescription to exercise independently          Copy of goals given to participant.

## 2019-07-24 ENCOUNTER — Ambulatory Visit: Payer: Medicare Other | Admitting: Surgery

## 2019-07-27 ENCOUNTER — Other Ambulatory Visit: Payer: Self-pay

## 2019-07-27 ENCOUNTER — Encounter: Payer: Medicare Other | Attending: Internal Medicine | Admitting: *Deleted

## 2019-07-27 DIAGNOSIS — M1990 Unspecified osteoarthritis, unspecified site: Secondary | ICD-10-CM | POA: Insufficient documentation

## 2019-07-27 DIAGNOSIS — Z79899 Other long term (current) drug therapy: Secondary | ICD-10-CM | POA: Insufficient documentation

## 2019-07-27 DIAGNOSIS — I129 Hypertensive chronic kidney disease with stage 1 through stage 4 chronic kidney disease, or unspecified chronic kidney disease: Secondary | ICD-10-CM | POA: Insufficient documentation

## 2019-07-27 DIAGNOSIS — Z87891 Personal history of nicotine dependence: Secondary | ICD-10-CM | POA: Insufficient documentation

## 2019-07-27 DIAGNOSIS — R011 Cardiac murmur, unspecified: Secondary | ICD-10-CM | POA: Insufficient documentation

## 2019-07-27 DIAGNOSIS — Z952 Presence of prosthetic heart valve: Secondary | ICD-10-CM | POA: Insufficient documentation

## 2019-07-27 DIAGNOSIS — M810 Age-related osteoporosis without current pathological fracture: Secondary | ICD-10-CM | POA: Insufficient documentation

## 2019-07-27 DIAGNOSIS — Z7982 Long term (current) use of aspirin: Secondary | ICD-10-CM | POA: Insufficient documentation

## 2019-07-27 DIAGNOSIS — Z8673 Personal history of transient ischemic attack (TIA), and cerebral infarction without residual deficits: Secondary | ICD-10-CM | POA: Insufficient documentation

## 2019-07-27 DIAGNOSIS — I890 Lymphedema, not elsewhere classified: Secondary | ICD-10-CM | POA: Insufficient documentation

## 2019-07-27 DIAGNOSIS — I252 Old myocardial infarction: Secondary | ICD-10-CM | POA: Insufficient documentation

## 2019-07-27 DIAGNOSIS — Z7901 Long term (current) use of anticoagulants: Secondary | ICD-10-CM | POA: Insufficient documentation

## 2019-07-27 DIAGNOSIS — N189 Chronic kidney disease, unspecified: Secondary | ICD-10-CM | POA: Diagnosis not present

## 2019-07-27 DIAGNOSIS — Z7902 Long term (current) use of antithrombotics/antiplatelets: Secondary | ICD-10-CM | POA: Diagnosis not present

## 2019-07-27 NOTE — Progress Notes (Signed)
Daily Session Note  Patient Details  Name: Hailey Ellison MRN: [id number] Date of Birth: 1935/02/03 Referring Provider:     Cardiac Rehab from 07/22/2019 in [facility] Card[facility]  Referring Provider  Serafina Royals MD      Encounter Date: 07/27/2019  Check In: Session Check In - 07/27/19 1542      Check-In   Supervising physician immediately available to respond to emergencies  See telemetry face sheet for immediately available ER MD    Location  [facility]-Cardiac & Pulmonary Rehab    Staff Present  Renita Papa, RN Vickki Hearing, BA, ACSM CEP, Exercise Physiologist;Melissa Caiola RDN, LDN;Joseph Hood RCP,RRT,BSRT    Virtual Visit  No    Medication changes reported      No    Fall or balance concerns reported     No    Warm-up and Cool-down  Performed on first and last piece of equipment    Resistance Training Performed  Yes    VAD Patient?  No    PAD/SET Patient?  No      Pain Assessment   Currently in Pain?  No/denies          Social History   Tobacco Use  Smoking Status Former Smoker  . Types: Cigarettes  Smokeless Tobacco Never Used  Tobacco Comment   only smoked a few cigs throughout her lifetime    Goals Met:  Independence with exercise equipment Exercise tolerated well No report of cardiac concerns or symptoms Strength training completed today  Goals Unmet:  Not Applicable  Comments: First full day of exercise!  Patient was oriented to gym and equipment including functions, settings, policies, and procedures.  Patient's individual exercise prescription and treatment plan were reviewed.  All starting workloads were established based on the results of the 6 minute walk test done at initial orientation visit.  The plan for exercise progression was also introduced and progression will be customized based on patient's performance and goals.    Dr. Emily Filbert is Medical Director for [facility] and [facility].

## 2019-07-31 ENCOUNTER — Other Ambulatory Visit: Payer: Self-pay

## 2019-07-31 ENCOUNTER — Encounter: Payer: Self-pay | Admitting: Surgery

## 2019-07-31 ENCOUNTER — Ambulatory Visit (INDEPENDENT_AMBULATORY_CARE_PROVIDER_SITE_OTHER): Payer: Medicare Other | Admitting: Surgery

## 2019-07-31 ENCOUNTER — Ambulatory Visit: Payer: Medicare Other | Admitting: Surgery

## 2019-07-31 VITALS — BP 130/80 | HR 74 | Temp 97.8°F | Ht 64.0 in | Wt 177.0 lb

## 2019-07-31 DIAGNOSIS — C50912 Malignant neoplasm of unspecified site of left female breast: Secondary | ICD-10-CM

## 2019-07-31 DIAGNOSIS — I890 Lymphedema, not elsewhere classified: Secondary | ICD-10-CM | POA: Diagnosis not present

## 2019-07-31 NOTE — Progress Notes (Signed)
07/31/2019  History of Present Illness: Hailey Ellison is a 84 y.o. female presenting for follow up for left breast cancer.  She's s/p left lumpectomy and axillary dissection in 02/2009.  Had left breast biopsy which was negative in 07/2017.  She had mammogram on 07/08/19.  She denies any new masses or skin changes, nipple changes or drainage.  Reports her left arm lymphedema is better with the new sleeve.    Past Medical History: Past Medical History:  Diagnosis Date  . Anxiety   . Arthritis   . Breast cancer ([place]) 2010   Adjuvant chemotherapy with Taxotere/Carboplatin/Herceptin. Arimidex initiated November 2011  . Cancer ([place]) 2010   T1c,N2a, M0; ER 90%, PR 60%,  HER-2/neu 2+ IHC, equivocal on fish. 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  Margins clear.  . Chronic kidney disease 2014  . Depression   . GERD (gastroesophageal reflux disease)   . Heart attack ([place]) 06/09/2016  . Hypertension   . Lymphedema    left arm  . Murmur   . Osteoporosis   . Personal history of chemotherapy   . Radiation 2010   with chemo  . Squamous cell carcinoma June 2016   Left upper inner chest wall, area of previous radiation.  . Stroke ([place])    per pt, TIA a long time ago.  . Tinnitus      Past Surgical History: Past Surgical History:  Procedure Laterality Date  . ABDOMINAL HYSTERECTOMY    . BREAST BIOPSY Left 2010   DCIS  . BREAST BIOPSY Right    neg  . BREAST CYST EXCISION    . BREAST EXCISIONAL BIOPSY Left 07/26/2017   neg/fat necrosis  . BREAST LUMPECTOMY Left 2010  . CARDIAC CATHETERIZATION N/A 06/08/2016   Procedure: Left Heart Cath and Coronary Angiography;  Surgeon: Wellington Hampshire, MD;  Location: [place] [facility];  Service: Cardiovascular;  Laterality: N/A;  . CARDIAC CATHETERIZATION N/A 06/08/2016   Procedure: Coronary Stent Intervention;  Surgeon: Wellington Hampshire, MD;  Location: [place] [facility];  Service: Cardiovascular;  Laterality: N/A;  . CARPAL TUNNEL RELEASE Bilateral   . CHOLECYSTECTOMY    . JOINT REPLACEMENT Left 2008   Total Knee Replacement  . MASTECTOMY, PARTIAL Left 07/26/2017   Procedure: MASTECTOMY PARTIAL;  Surgeon: Robert Bellow, MD;  Location: [facility] ORS;  Service: General;  Laterality: Left;  . PARTIAL HYSTERECTOMY  1980's  . PORT-A-CATH REMOVAL    . RIGHT/LEFT HEART CATH AND CORONARY ANGIOGRAPHY N/A 04/23/2019   Procedure: RIGHT/LEFT HEART CATH AND CORONARY ANGIOGRAPHY;  Surgeon: Corey Skains, MD;  Location: [place] [facility];  Service: Cardiovascular;  Laterality: N/A;    Home Medications: Prior to Admission medications   Medication Sig Start Date End Date Taking? Authorizing Provider  acetaminophen (TYLENOL) 325 MG tablet Take by mouth. 05/28/18  Yes [provider]  amLODipine (NORVASC) 5 MG tablet Take by mouth. 06/03/19 06/02/20 Yes [provider]  anastrozole (ARIMIDEX) 1 MG tablet TAKE 1 TABLET DAILY Patient taking differently: Take 1 mg by mouth daily.  04/13/19  Yes Byrnett, Forest Gleason, MD  aspirin 81 MG chewable tablet Chew 1 tablet (81 mg total) by mouth daily. 06/10/16  Yes Demetrios Loll, MD  calcium citrate-vitamin D (CITRACAL+D) 315-200 MG-UNIT per tablet Take 1 tablet by mouth 2 (two) times daily.   Yes [provider]  clopidogrel (  PLAVIX) 75 MG tablet Take by mouth. 06/03/19 06/02/20 Yes [provider]  ergocalciferol (VITAMIN D2) 50000 units capsule Take 50,000 Units by mouth every 30 (thirty) days.    Yes [provider]  furosemide (LASIX) 20 MG tablet Take 20 mg by mouth daily as needed for fluid.    Yes [provider]  gabapentin (NEURONTIN) 300 MG capsule 2 Capsules in the am and 3 capsules at night 06/23/19  Yes [provider]  Glucosamine-Chondroit-Vit C-Mn (GLUCOSAMINE 1500 COMPLEX PO) Take 2 tablets by mouth daily.   Yes [provider]  lansoprazole (PREVACID) 30 MG capsule Take 30 mg by mouth daily.   Yes [provider]  losartan (COZAAR) 50 MG tablet Take 50 mg by mouth daily.   Yes [provider]  Multiple Vitamin (MULTI-VITAMIN) tablet Take by mouth.   Yes [provider]  Multiple Vitamin (MULTIVITAMIN WITH MINERALS) TABS Take 1 tablet by mouth daily.   Yes [provider]  nystatin cream (MYCOSTATIN) Apply topically. 06/25/19 06/24/20 Yes [provider]  PARoxetine (PAXIL) 40 MG tablet Take 40 mg by mouth at bedtime.    Yes [provider]  rosuvastatin (CRESTOR) 20 MG tablet Take 20 mg by mouth daily.   Yes [provider]  senna-docusate (SENOKOT-S) 8.6-50 MG tablet Take by mouth. 06/03/19  Yes [provider]  triamcinolone cream (KENALOG) 0.5 % Apply topically. 06/08/19 06/07/20 Yes [provider]  vitamin B-12 (CYANOCOBALAMIN) 500 MCG tablet Take 500 mcg by mouth daily.   Yes [provider]    Allergies: Allergies  Allergen Reactions  . Alendronate Shortness Of Breath  . Benadryl [Diphenhydramine Hcl] Hives  . Tegaderm Ag Mesh [Silver]     tegaderm causes rash  . Neosporin [Neomycin-Bacitracin Zn-Polymyx] Rash    Tolerates Polysporin(bacitracin zinc - polymyxin B)  . Sulfa Antibiotics Rash  . Tape Rash    Tape and bandaids    Review of Systems: Review of Systems  Constitutional: Negative for chills and fever.  Respiratory: Negative for shortness of breath.   Cardiovascular: Negative for chest pain.  Gastrointestinal: Negative for nausea and vomiting.  Skin: Negative for rash.    Physical Exam BP 130/80   Pulse 74   Temp 97.8 F (36.6 C) (Skin)   Ht '5\' 4"'$  (1.626 m)   Wt 177 lb (80.3 kg)   SpO2 98%   BMI 30.38 kg/m  CONSTITUTIONAL: No acute distress HEENT:  Normocephalic, atraumatic, extraocular motion intact. RESPIRATORY:  Lungs are clear, and breath sounds are equal bilaterally. Normal  respiratory effort without pathologic use of accessory muscles. CARDIOVASCULAR: Heart is regular without murmurs, gallops, or rubs. BREAST:  Left breast s/p lumpectomy with scar well healed.  Left axilla with scar well healed.  No palpable breast masses, no new skin changes, no nipple changes or drainage.  Axillary exam negative for any masses.  Right breast and axilla exam negative. EXTREMITIES:  Left arm with sleeve, larger in size compared to right arm.  No pitting edema. NEUROLOGIC:  Motor and sensation is grossly normal.  Cranial nerves are grossly intact. PSYCH:  Alert and oriented to person, place and time. Affect is normal.  Labs/Imaging: Mammogram 07/08/19: FINDINGS: There are no findings suspicious for malignancy. Images were processed with CAD.  IMPRESSION: No mammographic evidence of malignancy. A result letter of this screening mammogram will be mailed directly to the patient.  RECOMMENDATION: Screening mammogram in one year. (Code:SM-B-01Y)  Assessment and Plan: This is a 84  y.o. female s/p left breast lumpectomy and axillary dissection in 2010.  Reviewed mammogram results with patient.  Patient doing wlel without issues and exam today without new findings.  Mammograms have been negative since her biopsy in 2018 which was also benign.  At this point, patient may follow up with her PCP for further yearly screening mammograms.  Informed patient that we would always be available if any concerning findings.  Continue working with PT/OT for her lymphedema.  Face-to-face time spent with the patient and care providers was 15 minutes, with more than 50% of the time spent counseling, educating, and coordinating care of the patient.     Melvyn Neth, [facility]

## 2019-07-31 NOTE — Patient Instructions (Signed)
Return to her PCP for mammograms and breast checks. The patient is aware to call back for any questions or concerns.

## 2019-08-03 ENCOUNTER — Other Ambulatory Visit: Payer: Self-pay

## 2019-08-03 DIAGNOSIS — Z952 Presence of prosthetic heart valve: Secondary | ICD-10-CM

## 2019-08-03 NOTE — Progress Notes (Signed)
Daily Session Note  Patient Details  Name: HAIDYN KILBURG MRN: [id number] Date of Birth: August 03, 1935 Referring Provider:     Cardiac Rehab from 07/22/2019 in [facility] Cardiac [facility] Rehab  Referring Provider  Serafina Royals MD      Encounter Date: 08/03/2019  Check In:      Social History   Tobacco Use  Smoking Status Former Smoker  . Types: Cigarettes  Smokeless Tobacco Never Used  Tobacco Comment   only smoked a few cigs throughout her lifetime    Goals Met:  Independence with exercise equipment Exercise tolerated well No report of cardiac concerns or symptoms Strength training completed today  Goals Unmet:  Not Applicable  Comments: Pt able to follow exercise prescription today without complaint.  Will continue to monitor for progression.    Dr. Emily Filbert is Medical Director for [facility] and [facility].

## 2019-08-05 ENCOUNTER — Encounter: Payer: Self-pay | Admitting: *Deleted

## 2019-08-05 DIAGNOSIS — Z952 Presence of prosthetic heart valve: Secondary | ICD-10-CM

## 2019-08-05 NOTE — Progress Notes (Signed)
Cardiac Individual Treatment Plan  Patient Details  Name: Hailey Ellison MRN: [id number] Date of Birth: 1935-01-03 Referring Provider:     Cardiac Rehab from 07/22/2019 in [facility] Cardiac and Pulmonary Rehab  Referring Provider  Serafina Royals MD      Initial Encounter Date:    Cardiac Rehab from 07/22/2019 in [facility] Cardiac and Pulmonary Rehab  Date  07/22/19      Visit Diagnosis: S/P TAVR (transcatheter aortic valve replacement)  Patient's Home Medications on Admission:  Current Outpatient Medications:  .  acetaminophen (TYLENOL) 325 MG tablet, Take by mouth., Disp: , Rfl:  .  amLODipine (NORVASC) 5 MG tablet, Take by mouth., Disp: , Rfl:  .  anastrozole (ARIMIDEX) 1 MG tablet, TAKE 1 TABLET DAILY (Patient taking differently: Take 1 mg by mouth daily. ), Disp: 90 tablet, Rfl: 3 .  aspirin 81 MG chewable tablet, Chew 1 tablet (81 mg total) by mouth daily., Disp: 30 tablet, Rfl: 2 .  calcium citrate-vitamin D (CITRACAL+D) 315-200 MG-UNIT per tablet, Take 1 tablet by mouth 2 (two) times daily., Disp: , Rfl:  .  clopidogrel (PLAVIX) 75 MG tablet, Take by mouth., Disp: , Rfl:  .  ergocalciferol (VITAMIN D2) 50000 units capsule, Take 50,000 Units by mouth every 30 (thirty) days. , Disp: , Rfl:  .  furosemide (LASIX) 20 MG tablet, Take 20 mg by mouth daily as needed for fluid. , Disp: , Rfl:  .  gabapentin (NEURONTIN) 300 MG capsule, 2 Capsules in the am and 3 capsules at night, Disp: , Rfl:  .  Glucosamine-Chondroit-Vit C-Mn (GLUCOSAMINE 1500 COMPLEX PO), Take 2 tablets by mouth daily., Disp: , Rfl:  .  lansoprazole (PREVACID) 30 MG capsule, Take 30 mg by mouth daily., Disp: , Rfl:  .  losartan (COZAAR) 50 MG tablet, Take 50 mg by mouth daily., Disp: , Rfl:  .  Multiple Vitamin (MULTI-VITAMIN) tablet, Take by mouth., Disp: , Rfl:  .  Multiple Vitamin (MULTIVITAMIN WITH MINERALS) TABS, Take 1 tablet by mouth daily., Disp: , Rfl:  .  nystatin cream (MYCOSTATIN), Apply topically., Disp: , Rfl:   .  PARoxetine (PAXIL) 40 MG tablet, Take 40 mg by mouth at bedtime. , Disp: , Rfl:  .  rosuvastatin (CRESTOR) 20 MG tablet, Take 20 mg by mouth daily., Disp: , Rfl:  .  senna-docusate (SENOKOT-S) 8.6-50 MG tablet, Take by mouth., Disp: , Rfl:  .  triamcinolone cream (KENALOG) 0.5 %, Apply topically., Disp: , Rfl:  .  vitamin B-12 (CYANOCOBALAMIN) 500 MCG tablet, Take 500 mcg by mouth daily., Disp: , Rfl:   Past Medical History: Past Medical History:  Diagnosis Date  . Anxiety   . Arthritis   . Breast cancer ([place]) 2010   Adjuvant chemotherapy with Taxotere/Carboplatin/Herceptin. Arimidex initiated November 2011  . Cancer ([place]) 2010   T1c,N2a, M0; ER 90%, PR 60%,  HER-2/neu 2+ IHC, equivocal on fish. 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  Margins clear.  . Chronic kidney disease 2014  . Depression   . GERD (gastroesophageal reflux disease)   . Heart attack ([place]) 06/09/2016  . Hypertension   . Lymphedema    left arm  . Murmur   . Osteoporosis   . Personal history of chemotherapy   . Radiation 2010   with chemo  . Squamous cell carcinoma June 2016   Left upper inner chest wall, area of previous  radiation.  . Stroke ([place])    per pt, TIA a long time ago.  . Tinnitus     Tobacco Use: Social History   Tobacco Use  Smoking Status Former Smoker  . Types: Cigarettes  Smokeless Tobacco Never Used  Tobacco Comment   only smoked a few cigs throughout her lifetime    Labs: Recent Review Flowsheet Data    Labs for ITP Cardiac and Pulmonary Rehab Latest Ref Rng & Units 06/08/2016   Cholestrol 0 - 200 mg/dL 135   LDLCALC 0 - 99 mg/dL 81   HDL >40 mg/dL 47   Trlycerides <150 mg/dL 37       Exercise Target Goals: Exercise Program Goal: Individual exercise prescription set using results from initial 6 min walk test and THRR while considering  patient's  activity barriers and safety.   Exercise Prescription Goal: Initial exercise prescription builds to 30-45 minutes a day of aerobic activity, 2-3 days per week.  Home exercise guidelines will be given to patient during program as part of exercise prescription that the participant will acknowledge.  Activity Barriers & Risk Stratification: Activity Barriers & Cardiac Risk Stratification - 07/22/19 1428      Activity Barriers & Cardiac Risk Stratification   Activity Barriers  Shortness of Breath;Arthritis;Balance Concerns;Muscular Weakness;Deconditioning;Assistive Device;Left Knee Replacement    Cardiac Risk Stratification  High       6 Minute Walk: 6 Minute Walk    Row Name 07/22/19 1418         6 Minute Walk   Phase  Initial     Distance  830 feet     Walk Time  6 minutes     # of Rest Breaks  0     MPH  1.57     METS  1.36     RPE  11     Perceived Dyspnea   1     VO2 Peak  4.77     Symptoms  No     Resting HR  68 bpm     Resting BP  124/70     Resting Oxygen Saturation   97 %     Exercise Oxygen Saturation  during 6 min walk  98 %     Max Ex. HR  93 bpm     Max Ex. BP  154/74     2 Minute Post BP  130/64        Oxygen Initial Assessment:   Oxygen Re-Evaluation:   Oxygen Discharge (Final Oxygen Re-Evaluation):   Initial Exercise Prescription: Initial Exercise Prescription - 07/22/19 1400      Date of Initial Exercise RX and Referring Provider   Date  07/22/19    Referring Provider  Serafina Royals MD      NuStep   Level  1    SPM  80    Minutes  15    METs  1.5      Arm Ergometer   Level  1    RPM  50    Minutes  15    METs  1.5      Recumbant Elliptical   Level  1    RPM  50    Minutes  15    METs  1.5      Biostep-RELP   Level  1    SPM  50    Minutes  15    METs  1      Prescription Details   Frequency (times  per week)  2    Duration  Progress to 30 minutes of continuous aerobic without signs/symptoms of physical distress       Intensity   THRR 40-80% of Max Heartrate  96-125    Ratings of Perceived Exertion  11-13    Perceived Dyspnea  0-4      Progression   Progression  Continue to progress workloads to maintain intensity without signs/symptoms of physical distress.      Resistance Training   Training Prescription  Yes    Weight  3 lbs    Reps  10-15       Perform Capillary Blood Glucose checks as needed.  Exercise Prescription Changes: Exercise Prescription Changes    Row Name 07/22/19 1400             Response to Exercise   Blood Pressure (Admit)  124/70       Blood Pressure (Exercise)  154/74       Blood Pressure (Exit)  130/64       Heart Rate (Admit)  68 bpm       Heart Rate (Exercise)  93 bpm       Heart Rate (Exit)  69 bpm       Oxygen Saturation (Admit)  97 %       Oxygen Saturation (Exercise)  98 %       Rating of Perceived Exertion (Exercise)  11       Perceived Dyspnea (Exercise)  1       Symptoms  wobbly at end, fatigued, SOB       Comments  walk test results          Exercise Comments:   Exercise Goals and Review: Exercise Goals    Row Name 07/22/19 1437             Exercise Goals   Increase Physical Activity  Yes       Intervention  Provide advice, education, support and counseling about physical activity/exercise needs.;Develop an individualized exercise prescription for aerobic and resistive training based on initial evaluation findings, risk stratification, comorbidities and participant's personal goals.       Expected Outcomes  Short Term: Attend rehab on a regular basis to increase amount of physical activity.;Long Term: Add in home exercise to make exercise part of routine and to increase amount of physical activity.;Long Term: Exercising regularly at least 3-5 days a week.       Increase Strength and Stamina  Yes       Intervention  Provide advice, education, support and counseling about physical activity/exercise needs.;Develop an individualized exercise  prescription for aerobic and resistive training based on initial evaluation findings, risk stratification, comorbidities and participant's personal goals.       Expected Outcomes  Short Term: Increase workloads from initial exercise prescription for resistance, speed, and METs.;Short Term: Perform resistance training exercises routinely during rehab and add in resistance training at home;Long Term: Improve cardiorespiratory fitness, muscular endurance and strength as measured by increased METs and functional capacity (6MWT)       Able to understand and use rate of perceived exertion (RPE) scale  Yes       Intervention  Provide education and explanation on how to use RPE scale       Expected Outcomes  Short Term: Able to use RPE daily in rehab to express subjective intensity level;Long Term:  Able to use RPE to guide intensity level when exercising independently  Able to understand and use Dyspnea scale  Yes       Intervention  Provide education and explanation on how to use Dyspnea scale       Expected Outcomes  Short Term: Able to use Dyspnea scale daily in rehab to express subjective sense of shortness of breath during exertion;Long Term: Able to use Dyspnea scale to guide intensity level when exercising independently       Knowledge and understanding of Target Heart Rate Range (THRR)  Yes       Intervention  Provide education and explanation of THRR including how the numbers were predicted and where they are located for reference       Expected Outcomes  Short Term: Able to state/look up THRR;Short Term: Able to use daily as guideline for intensity in rehab;Long Term: Able to use THRR to govern intensity when exercising independently       Able to check pulse independently  Yes       Intervention  Provide education and demonstration on how to check pulse in carotid and radial arteries.;Review the importance of being able to check your own pulse for safety during independent exercise        Expected Outcomes  Short Term: Able to explain why pulse checking is important during independent exercise;Long Term: Able to check pulse independently and accurately       Understanding of Exercise Prescription  Yes       Intervention  Provide education, explanation, and written materials on patient's individual exercise prescription       Expected Outcomes  Short Term: Able to explain program exercise prescription;Long Term: Able to explain home exercise prescription to exercise independently          Exercise Goals Re-Evaluation : Exercise Goals Re-Evaluation    Row Name 07/27/19 1546             Exercise Goal Re-Evaluation   Exercise Goals Review  Increase Physical Activity;Able to understand and use rate of perceived exertion (RPE) scale;Knowledge and understanding of Target Heart Rate Range (THRR);Understanding of Exercise Prescription;Increase Strength and Stamina;Able to check pulse independently       Comments  Reviewed RPE scale, THR and program prescription with pt today.  Pt voiced understanding and was given a copy of goals to take home.       Expected Outcomes  Short: Use RPE daily to regulate intensity. Long: Follow program prescription in THR.          Discharge Exercise Prescription (Final Exercise Prescription Changes): Exercise Prescription Changes - 07/22/19 1400      Response to Exercise   Blood Pressure (Admit)  124/70    Blood Pressure (Exercise)  154/74    Blood Pressure (Exit)  130/64    Heart Rate (Admit)  68 bpm    Heart Rate (Exercise)  93 bpm    Heart Rate (Exit)  69 bpm    Oxygen Saturation (Admit)  97 %    Oxygen Saturation (Exercise)  98 %    Rating of Perceived Exertion (Exercise)  11    Perceived Dyspnea (Exercise)  1    Symptoms  wobbly at end, fatigued, SOB    Comments  walk test results       Nutrition:  Target Goals: Understanding of nutrition guidelines, daily intake of sodium '1500mg'$ , cholesterol '200mg'$ , calories 30% from fat and 7% or  less from saturated fats, daily to have 5 or more servings of fruits and vegetables.  Biometrics: Pre  Biometrics - 07/22/19 1438      Pre Biometrics   Height  5' 4.4" (1.636 m)    Weight  179 lb 12.8 oz (81.6 kg)    BMI (Calculated)  30.47    Single Leg Stand  0.69 seconds        Nutrition Therapy Plan and Nutrition Goals: Nutrition Therapy & Goals - 07/22/19 1419      Nutrition Therapy   Diet  low na, HH, renal diet    Drug/Food Interactions  Statins/Certain Fruits   crestor   Protein (specify units)  66g    Fiber  25 grams    Whole Grain Foods  3 servings    Saturated Fats  12 max. grams    Fruits and Vegetables  5 servings/day    Sodium  1.5 grams      Personal Nutrition Goals   Nutrition Goal  ST: explore resources to determine what she would want to change LT: eat healthier, walk better    Comments  Pt on lasix, CKD stg 4, senokot, B12, Ca with vit D for osteopenia. Pt reports eating breakfast biscuit with sausage and cheese daily for B. Will have ice cream or chips and maybe a peanut butter and jelly sandwich later. Pt reports tryin to eat 1 whole egg everyday but is not doing that yet. Will sometimes have fried chciken sandwich or tenders. Pt reports not cooking.  Encouraged pt to increase protein. Discussed HH, renal, and low Na eating. Talked about healthy snack options.      Intervention Plan   Intervention  Prescribe, educate and counsel regarding individualized specific dietary modifications aiming towards targeted core components such as weight, hypertension, lipid management, diabetes, heart failure and other comorbidities.;Nutrition handout(s) given to patient.   HH, low Na, renal   Expected Outcomes  Short Term Goal: Understand basic principles of dietary content, such as calories, fat, sodium, cholesterol and nutrients.;Short Term Goal: A plan has been developed with personal nutrition goals set during dietitian appointment.;Long Term Goal: Adherence to prescribed  nutrition plan.       Nutrition Assessments: Nutrition Assessments - 07/22/19 1426      Rate Your Plate Scores   Pre Score  --      MEDFICTS Scores   Pre Score  52       Nutrition Goals Re-Evaluation:   Nutrition Goals Discharge (Final Nutrition Goals Re-Evaluation):   Psychosocial: Target Goals: Acknowledge presence or absence of significant depression and/or stress, maximize coping skills, provide positive support system. Participant is able to verbalize types and ability to use techniques and skills needed for reducing stress and depression.   Initial Review & Psychosocial Screening: Initial Psych Review & Screening - 07/20/19 1412      Initial Review   Current issues with  None Identified      Family Dynamics   Good Support System?  Yes   Daughters     Barriers   Psychosocial barriers to participate in program  There are no identifiable barriers or psychosocial needs.      Screening Interventions   Interventions  Encouraged to exercise;Provide feedback about the scores to participant;To provide support and resources with identified psychosocial needs    Expected Outcomes  Short Term goal: Utilizing psychosocial counselor, staff and physician to assist with identification of specific Stressors or current issues interfering with healing process. Setting desired goal for each stressor or current issue identified.;Long Term Goal: Stressors or current issues are controlled or eliminated.;Short Term goal:  Identification and review with participant of any Quality of Life or Depression concerns found by scoring the questionnaire.;Long Term goal: The participant improves quality of Life and PHQ9 Scores as seen by post scores and/or verbalization of changes       Quality of Life Scores:  Quality of Life - 07/22/19 1447      Quality of Life   Select  Quality of Life      Quality of Life Scores   Health/Function Pre  20.46 %    Socioeconomic Pre  25 %    Psych/Spiritual  Pre  25.71 %    Family Pre  25.5 %    GLOBAL Pre  23.18 %      Scores of 19 and below usually indicate a poorer quality of life in these areas.  A difference of  2-3 points is a clinically meaningful difference.  A difference of 2-3 points in the total score of the Quality of Life Index has been associated with significant improvement in overall quality of life, self-image, physical symptoms, and general health in studies assessing change in quality of life.  PHQ-9: Recent Review Flowsheet Data    Depression screen [facility] 2/9 07/22/2019 06/25/2016   Decreased Interest 2 1   Down, Depressed, Hopeless 1 0   PHQ - 2 Score 3 1   Altered sleeping 0 1   Tired, decreased energy 1 2   Change in appetite 1 3   Feeling bad or failure about yourself  0 0   Trouble concentrating 0 0   Moving slowly or fidgety/restless 0 0   Suicidal thoughts 0 -   PHQ-9 Score 5 7   Difficult doing work/chores Somewhat difficult Somewhat difficult     Interpretation of Total Score  Total Score Depression Severity:  1-4 = Minimal depression, 5-9 = Mild depression, 10-14 = Moderate depression, 15-19 = Moderately severe depression, 20-27 = Severe depression   Psychosocial Evaluation and Intervention:   Psychosocial Re-Evaluation:   Psychosocial Discharge (Final Psychosocial Re-Evaluation):   Vocational Rehabilitation: Provide vocational rehab assistance to qualifying candidates.   Vocational Rehab Evaluation & Intervention:   Education: Education Goals: Education classes will be provided on a variety of topics geared toward better understanding of heart health and risk factor modification. Participant will state understanding/return demonstration of topics presented as noted by education test scores.  Learning Barriers/Preferences: Learning Barriers/Preferences - 07/20/19 1414      Learning Barriers/Preferences   Learning Barriers  None   Eye surgeries  First eye due end of August. Glasses not working  well presently for reading   Gaffer Material       Education Topics:  AED/CPR: - Group verbal and written instruction with the use of models to demonstrate the basic use of the AED with the basic ABC's of resuscitation.   General Nutrition Guidelines/Fats and Fiber: -Group instruction provided by verbal, written material, models and posters to present the general guidelines for heart healthy nutrition. Gives an explanation and review of dietary fats and fiber.   Controlling Sodium/Reading Food Labels: -Group verbal and written material supporting the discussion of sodium use in heart healthy nutrition. Review and explanation with models, verbal and written materials for utilization of the food label.   Exercise Physiology & General Exercise Guidelines: - Group verbal and written instruction with models to review the exercise physiology of the cardiovascular system and associated critical values. Provides general exercise guidelines with specific guidelines to those with heart or lung  disease.    Aerobic Exercise & Resistance Training: - Gives group verbal and written instruction on the various components of exercise. Focuses on aerobic and resistive training programs and the benefits of this training and how to safely progress through these programs..   Flexibility, Balance, Mind/Body Relaxation: Provides group verbal/written instruction on the benefits of flexibility and balance training, including mind/body exercise modes such as yoga, pilates and tai chi.  Demonstration and skill practice provided.   Stress and Anxiety: - Provides group verbal and written instruction about the health risks of elevated stress and causes of high stress.  Discuss the correlation between heart/lung disease and anxiety and treatment options. Review healthy ways to manage with stress and anxiety.   Depression: - Provides group verbal and written instruction on the correlation  between heart/lung disease and depressed mood, treatment options, and the stigmas associated with seeking treatment.   Anatomy & Physiology of the Heart: - Group verbal and written instruction and models provide basic cardiac anatomy and physiology, with the coronary electrical and arterial systems. Review of Valvular disease and Heart Failure   Cardiac Rehab from 08/13/2016 in [facility] Cardiac and Pulmonary Rehab  Date  08/13/16  Educator  CE  Instruction Review Code (retired)  2- meets goals/outcomes      Cardiac Procedures: - Group verbal and written instruction to review commonly prescribed medications for heart disease. Reviews the medication, class of the drug, and side effects. Includes the steps to properly store meds and maintain the prescription regimen. (beta blockers and nitrates)   Cardiac Medications I: - Group verbal and written instruction to review commonly prescribed medications for heart disease. Reviews the medication, class of the drug, and side effects. Includes the steps to properly store meds and maintain the prescription regimen.   Cardiac Medications II: -Group verbal and written instruction to review commonly prescribed medications for heart disease. Reviews the medication, class of the drug, and side effects. (all other drug classes)    Go Sex-Intimacy & Heart Disease, Get SMART - Goal Setting: - Group verbal and written instruction through game format to discuss heart disease and the return to sexual intimacy. Provides group verbal and written material to discuss and apply goal setting through the application of the S.M.A.R.T. Method.   Other Matters of the Heart: - Provides group verbal, written materials and models to describe Stable Angina and Peripheral Artery. Includes description of the disease process and treatment options available to the cardiac patient.   Cardiac Rehab from 08/13/2016 in [facility] Cardiac and Pulmonary Rehab  Date  08/13/16  Educator  CE   Instruction Review Code (retired)  2- meets goals/outcomes      Exercise & Equipment Safety: - Individual verbal instruction and demonstration of equipment use and safety with use of the equipment.   Cardiac Rehab from 07/22/2019 in [facility] Cardiac and Pulmonary Rehab  Date  07/22/19  Educator  [facility]  Instruction Review Code  1- Verbalizes Understanding      Infection Prevention: - Provides verbal and written material to individual with discussion of infection control including proper hand washing and proper equipment cleaning during exercise session.   Cardiac Rehab from 07/22/2019 in [facility] Cardiac and Pulmonary Rehab  Date  07/22/19  Educator  [facility]  Instruction Review Code  1- Verbalizes Understanding      Falls Prevention: - Provides verbal and written material to individual with discussion of falls prevention and safety.   Cardiac Rehab from 07/22/2019 in [facility] Cardiac and Pulmonary Rehab  Date  07/22/19  Educator  [facility]  Instruction Review Code  1- Verbalizes Understanding      Diabetes: - Individual verbal and written instruction to review signs/symptoms of diabetes, desired ranges of glucose level fasting, after meals and with exercise. Acknowledge that pre and post exercise glucose checks will be done for 3 sessions at entry of program.   Know Your Numbers and Risk Factors: -Group verbal and written instruction about important numbers in your health.  Discussion of what are risk factors and how they play a role in the disease process.  Review of Cholesterol, Blood Pressure, Diabetes, and BMI and the role they play in your overall health.   Sleep Hygiene: -Provides group verbal and written instruction about how sleep can affect your health.  Define sleep hygiene, discuss sleep cycles and impact of sleep habits. Review good sleep hygiene tips.    Other: -Provides group and verbal instruction on various topics (see comments)   Knowledge Questionnaire Score: Knowledge  Questionnaire Score - 07/22/19 1448      Knowledge Questionnaire Score   Pre Score  23/26   Education Focus: Angina, Nutrition      Core Components/Risk Factors/Patient Goals at Admission: Personal Goals and Risk Factors at Admission - 07/22/19 1438      Core Components/Risk Factors/Patient Goals on Admission    Weight Management  Yes;Weight Loss    Intervention  Weight Management: Develop a combined nutrition and exercise program designed to reach desired caloric intake, while maintaining appropriate intake of nutrient and fiber, sodium and fats, and appropriate energy expenditure required for the weight goal.;Weight Management: Provide education and appropriate resources to help participant work on and attain dietary goals.    Admit Weight  179 lb 12.8 oz (81.6 kg)    Goal Weight: Short Term  175 lb (79.4 kg)    Goal Weight: Long Term  169 lb (76.7 kg)    Expected Outcomes  Short Term: Continue to assess and modify interventions until short term weight is achieved;Long Term: Adherence to nutrition and physical activity/exercise program aimed toward attainment of established weight goal;Weight Loss: Understanding of general recommendations for a balanced deficit meal plan, which promotes 1-2 lb weight loss per week and includes a negative energy balance of [phone] kcal/d;Understanding recommendations for meals to include 15-35% energy as protein, 25-35% energy from fat, 35-60% energy from carbohydrates, less than '200mg'$  of dietary cholesterol, 20-35 gm of total fiber daily;Understanding of distribution of calorie intake throughout the day with the consumption of 4-5 meals/snacks    Hypertension  Yes    Intervention  Provide education on lifestyle modifcations including regular physical activity/exercise, weight management, moderate sodium restriction and increased consumption of fresh fruit, vegetables, and low fat dairy, alcohol moderation, and smoking cessation.;Monitor prescription use  compliance.    Expected Outcomes  Short Term: Continued assessment and intervention until BP is < 140/45m HG in hypertensive participants. < 130/856mHG in hypertensive participants with diabetes, heart failure or chronic kidney disease.;Long Term: Maintenance of blood pressure at goal levels.    Lipids  Yes    Intervention  Provide education and support for participant on nutrition & aerobic/resistive exercise along with prescribed medications to achieve LDL '70mg'$ , HDL >'40mg'$ .    Expected Outcomes  Short Term: Participant states understanding of desired cholesterol values and is compliant with medications prescribed. Participant is following exercise prescription and nutrition guidelines.;Long Term: Cholesterol controlled with medications as prescribed, with individualized exercise RX and with personalized nutrition plan. Value goals: LDL <  $'70mg'X$ , HDL > 40 mg.       Core Components/Risk Factors/Patient Goals Review:    Core Components/Risk Factors/Patient Goals at Discharge (Final Review):    ITP Comments: ITP Comments    Row Name 07/20/19 1405 07/22/19 1418 07/27/19 1545 08/05/19 0606     ITP Comments  Orientation Virtual Call completed today. EP/RD appt on 7/29.     Documentation of diagnosis can be found in [facility] 6/7.  Completed 6MWT, gym orientation, and RD eval today.  Initial ITP created and sent to Dr. Emily Filbert, Medical director for review.  First full day of exercise!  Patient was oriented to gym and equipment including functions, settings, policies, and procedures.  Patient's individual exercise prescription and treatment plan were reviewed.  All starting workloads were established based on the results of the 6 minute walk test done at initial orientation visit.  The plan for exercise progression was also introduced and progression will be customized based on patient's performance and goals.  30 Day Review Completed today. Continue with ITP unless changed by Medical Director review.  New  to program       Comments:

## 2019-08-10 ENCOUNTER — Other Ambulatory Visit: Payer: Self-pay

## 2019-08-10 ENCOUNTER — Encounter: Payer: Medicare Other | Admitting: *Deleted

## 2019-08-10 DIAGNOSIS — Z952 Presence of prosthetic heart valve: Secondary | ICD-10-CM

## 2019-08-10 NOTE — Progress Notes (Signed)
Daily Session Note  Patient Details  Name: Hailey Ellison MRN: [id number] Date of Birth: [birth year] Referring Provider:     Cardiac Rehab from 07/22/2019 in [facility] Cardiac and Pulmonary Rehab  Referring Provider  Serafina Royals MD      Encounter Date: 08/10/2019  Check In: Session Check In - 08/10/19 1515      Check-In   Supervising physician immediately available to respond to emergencies  See telemetry face sheet for immediately available ER MD    Location  [facility]-Cardiac & Pulmonary Rehab    Staff Present  Jasper Loser BS, Exercise Physiologist;Susanne Bice, RN, BSN, Laveda Norman, BS, ACSM CEP, Exercise Physiologist    Virtual Visit  No    Medication changes reported      No    Fall or balance concerns reported     No    Warm-up and Cool-down  Performed on first and last piece of equipment    Resistance Training Performed  Yes    VAD Patient?  No    PAD/SET Patient?  No      Pain Assessment   Currently in Pain?  No/denies          Social History   Tobacco Use  Smoking Status Former Smoker  . Types: Cigarettes  Smokeless Tobacco Never Used  Tobacco Comment   only smoked a few cigs throughout her lifetime    Goals Met:  Independence with exercise equipment Exercise tolerated well No report of cardiac concerns or symptoms Strength training completed today  Goals Unmet:  Not Applicable  Comments: Pt able to follow exercise prescription today without complaint.  Will continue to monitor for progression.    Dr. Emily Filbert is Medical Director for [facility].

## 2019-08-12 ENCOUNTER — Encounter: Payer: Medicare Other | Admitting: *Deleted

## 2019-08-12 ENCOUNTER — Other Ambulatory Visit: Payer: Self-pay

## 2019-08-12 DIAGNOSIS — Z952 Presence of prosthetic heart valve: Secondary | ICD-10-CM

## 2019-08-12 NOTE — Progress Notes (Signed)
Daily Session Note  Patient Details  Name: Hailey Ellison MRN: [id number] Date of Birth: 10-25-1935 Referring Provider:     Cardiac Rehab from 07/22/2019 in [facility] Cardiac and Pulmonary Rehab  Referring Provider  Serafina Royals MD      Encounter Date: 08/12/2019  Check In: Session Check In - 08/12/19 1620      Check-In   Supervising physician immediately available to respond to emergencies  See telemetry face sheet for immediately available ER MD    Location  [facility]-Cardiac & Pulmonary Rehab    Staff Present  Heath Lark, RN, BSN, CCRP;Jeanna Durrell BS, Exercise Physiologist;Amanda Oletta Darter, BA, ACSM CEP, Exercise Physiologist    Virtual Visit  No    Medication changes reported      No    Warm-up and Cool-down  Performed on first and last piece of equipment    Resistance Training Performed  Yes    VAD Patient?  No    PAD/SET Patient?  No      Pain Assessment   Currently in Pain?  No/denies          Social History   Tobacco Use  Smoking Status Former Smoker  . Types: Cigarettes  Smokeless Tobacco Never Used  Tobacco Comment   only smoked a few cigs throughout her lifetime    Goals Met:  Independence with exercise equipment Exercise tolerated well No report of cardiac concerns or symptoms Strength training completed today  Goals Unmet:  Not Applicable  Comments: Doing well with exercise progression   Dr. Emily Filbert is Medical Director for [facility].

## 2019-08-17 ENCOUNTER — Other Ambulatory Visit: Payer: Self-pay

## 2019-08-17 ENCOUNTER — Encounter: Payer: Medicare Other | Admitting: *Deleted

## 2019-08-17 DIAGNOSIS — Z952 Presence of prosthetic heart valve: Secondary | ICD-10-CM | POA: Diagnosis not present

## 2019-08-17 NOTE — Progress Notes (Signed)
Daily Session Note  Patient Details  Name: Hailey Ellison MRN: [id number] Date of Birth: 01/24/1935 Referring Provider:     Cardiac Rehab from 07/22/2019 in [facility] Cardiac and Pulmonary Rehab  Referring Provider  Serafina Royals MD      Encounter Date: 08/17/2019  Check In: Session Check In - 08/17/19 1514      Check-In   Supervising physician immediately available to respond to emergencies  See telemetry face sheet for immediately available ER MD    Location  [facility]-Cardiac & Pulmonary Rehab    Staff Present  Heath Lark, RN, BSN, Laveda Norman, BS, ACSM CEP, Exercise Physiologist;Jeanna Durrell BS, Exercise Physiologist;Meredith Sherryll Burger, RN BSN    Virtual Visit  No    Medication changes reported      No    Fall or balance concerns reported     No    Warm-up and Cool-down  Performed on first and last piece of equipment    Resistance Training Performed  Yes    VAD Patient?  No    PAD/SET Patient?  No      Pain Assessment   Currently in Pain?  No/denies          Social History   Tobacco Use  Smoking Status Former Smoker  . Types: Cigarettes  Smokeless Tobacco Never Used  Tobacco Comment   only smoked a few cigs throughout her lifetime    Goals Met:  Independence with exercise equipment Exercise tolerated well No report of cardiac concerns or symptoms Strength training completed today  Goals Unmet:  Not Applicable  Comments: Pt able to follow exercise prescription today without complaint.  Will continue to monitor for progression.    Dr. Emily Filbert is Medical Director for [facility].

## 2019-08-19 ENCOUNTER — Encounter: Payer: Self-pay | Admitting: Emergency Medicine

## 2019-08-19 ENCOUNTER — Other Ambulatory Visit: Payer: Self-pay

## 2019-08-19 ENCOUNTER — Ambulatory Visit: Payer: Medicare Other

## 2019-08-19 ENCOUNTER — Ambulatory Visit
Admission: EM | Admit: 2019-08-19 | Discharge: 2019-08-19 | Disposition: A | Discharge status: Home / Self Care | Payer: Medicare Other | Attending: Emergency Medicine | Admitting: Emergency Medicine

## 2019-08-19 DIAGNOSIS — M79672 Pain in left foot: Secondary | ICD-10-CM | POA: Diagnosis present

## 2019-08-19 MED ORDER — DOXYCYCLINE HYCLATE 100 MG PO CAPS: 100.0000 mg | ORAL_CAPSULE | Freq: Two times a day (BID) | ORAL | 0 refills | Status: AC

## 2019-08-19 NOTE — ED Provider Notes (Signed)
HPI  SUBJECTIVE:  Hailey Ellison is a 84 y.o. female who presents with lateral left foot pain swelling, erythema starting 3 or 4 days ago.  She is unable to characterize the pain, but states that it is constant.  No known trauma to the foot, she is not walking more.  She uses a cane to ambulate.  No fevers, body aches, numbness or tingling in her foot.  No ankle pain.  She tried 500 mg of Tylenol at night with improvement in her symptoms.  Symptoms are worse with extending her toes, with weightbearing.  Patient has an extensive past medical history including breast cancer, osteoporosis, chronic kidney disease, MI, stroke.  No history of left foot fracture/injury.  PMD: [name], MD   Past Medical History:  Diagnosis Date  . Anxiety   . Arthritis   . Breast cancer ([place]) 2010   Adjuvant chemotherapy with Taxotere/Carboplatin/Herceptin. Arimidex initiated November 2011  . Cancer ([place]) 2010   T1c,N2a, M0; ER 90%, PR 60%,  HER-2/neu 2+ IHC, equivocal on fish. 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  Margins clear.  . Chronic kidney disease 2014  . Depression   . GERD (gastroesophageal reflux disease)   . Heart attack ([place]) 06/09/2016  . Hypertension   . Lymphedema    left arm  . Murmur   . Osteoporosis   . Personal history of chemotherapy   . Radiation 2010   with chemo  . Squamous cell carcinoma June 2016   Left upper inner chest wall, area of previous radiation.  . Stroke ([place])    per pt, TIA a long time ago.  . Tinnitus     Past Surgical History:  Procedure Laterality Date  . ABDOMINAL HYSTERECTOMY    . BREAST BIOPSY Left 2010   DCIS  . BREAST BIOPSY Right    neg  . BREAST CYST EXCISION    . BREAST EXCISIONAL BIOPSY Left 07/26/2017   neg/fat necrosis  . BREAST LUMPECTOMY Left 2010  . CARDIAC CATHETERIZATION N/A 06/08/2016   Procedure: Left  Heart Cath and Coronary Angiography;  Surgeon: Wellington Hampshire, MD;  Location: [place] [facility];  Service: Cardiovascular;  Laterality: N/A;  . CARDIAC CATHETERIZATION N/A 06/08/2016   Procedure: Coronary Stent Intervention;  Surgeon: Wellington Hampshire, MD;  Location: [place] [facility];  Service: Cardiovascular;  Laterality: N/A;  . CARPAL TUNNEL RELEASE Bilateral   . CHOLECYSTECTOMY    . JOINT REPLACEMENT Left 2008   Total Knee Replacement  . MASTECTOMY, PARTIAL Left 07/26/2017   Procedure: MASTECTOMY PARTIAL;  Surgeon: Robert Bellow, MD;  Location: [facility] ORS;  Service: General;  Laterality: Left;  . PARTIAL HYSTERECTOMY  1980's  . PORT-A-CATH REMOVAL    . RIGHT/LEFT HEART CATH AND CORONARY ANGIOGRAPHY N/A 04/23/2019   Procedure: RIGHT/LEFT HEART CATH AND CORONARY ANGIOGRAPHY;  Surgeon: Corey Skains, MD;  Location: [place] [facility];  Service: Cardiovascular;  Laterality: N/A;    Family History  Problem Relation Age of Onset  . Cancer Sister        breast  . Cancer Sister        kidney  . Hypertension Mother   . Hypertension Father   . Breast cancer Neg Hx     Social History   Tobacco Use  . Smoking status: Former Smoker    Types: Cigarettes  . Smokeless tobacco: Never  Used  . Tobacco comment: only smoked a few cigs throughout her lifetime  Substance Use Topics  . Alcohol use: No    Alcohol/week: 0.0 standard drinks  . Drug use: No    No current facility-administered medications for this encounter.   Current Outpatient Medications:  .  acetaminophen (TYLENOL) 325 MG tablet, Take by mouth., Disp: , Rfl:  .  amLODipine (NORVASC) 5 MG tablet, Take by mouth., Disp: , Rfl:  .  anastrozole (ARIMIDEX) 1 MG tablet, TAKE 1 TABLET DAILY (Patient taking differently: Take 1 mg by mouth daily. ), Disp: 90 tablet, Rfl: 3 .  aspirin 81 MG chewable tablet, Chew 1 tablet (81 mg total) by mouth daily., Disp: 30 tablet, Rfl: 2 .  calcium citrate-vitamin D (CITRACAL+D)  315-200 MG-UNIT per tablet, Take 1 tablet by mouth 2 (two) times daily., Disp: , Rfl:  .  clopidogrel (PLAVIX) 75 MG tablet, Take by mouth., Disp: , Rfl:  .  ergocalciferol (VITAMIN D2) 50000 units capsule, Take 50,000 Units by mouth every 30 (thirty) days. , Disp: , Rfl:  .  furosemide (LASIX) 20 MG tablet, Take 20 mg by mouth daily as needed for fluid. , Disp: , Rfl:  .  gabapentin (NEURONTIN) 300 MG capsule, 2 Capsules in the am and 3 capsules at night, Disp: , Rfl:  .  Glucosamine-Chondroit-Vit C-Mn (GLUCOSAMINE 1500 COMPLEX PO), Take 2 tablets by mouth daily., Disp: , Rfl:  .  lansoprazole (PREVACID) 30 MG capsule, Take 30 mg by mouth daily., Disp: , Rfl:  .  losartan (COZAAR) 50 MG tablet, Take 50 mg by mouth daily., Disp: , Rfl:  .  Multiple Vitamin (MULTI-VITAMIN) tablet, Take by mouth., Disp: , Rfl:  .  Multiple Vitamin (MULTIVITAMIN WITH MINERALS) TABS, Take 1 tablet by mouth daily., Disp: , Rfl:  .  nystatin cream (MYCOSTATIN), Apply topically., Disp: , Rfl:  .  PARoxetine (PAXIL) 40 MG tablet, Take 40 mg by mouth at bedtime. , Disp: , Rfl:  .  rosuvastatin (CRESTOR) 20 MG tablet, Take 20 mg by mouth daily., Disp: , Rfl:  .  senna-docusate (SENOKOT-S) 8.6-50 MG tablet, Take by mouth., Disp: , Rfl:  .  triamcinolone cream (KENALOG) 0.5 %, Apply topically., Disp: , Rfl:  .  vitamin B-12 (CYANOCOBALAMIN) 500 MCG tablet, Take 500 mcg by mouth daily., Disp: , Rfl:  .  doxycycline (VIBRAMYCIN) 100 MG capsule, Take 1 capsule (100 mg total) by mouth 2 (two) times daily for 7 days., Disp: 14 capsule, Rfl: 0  Allergies  Allergen Reactions  . Alendronate Shortness Of Breath  . Benadryl [Diphenhydramine Hcl] Hives  . Tegaderm Ag Mesh [Silver]     tegaderm causes rash  . Neosporin [Neomycin-Bacitracin Zn-Polymyx] Rash    Tolerates Polysporin(bacitracin zinc - polymyxin B)  . Sulfa Antibiotics Rash  . Tape Rash    Tape and bandaids     ROS  As noted in HPI.   Physical Exam  BP  129/90 (BP Location: Right Arm)   Pulse 83   Temp 98.1 F (36.7 C) (Oral)   Resp 18   Ht _0  (1.626 m)   Wt 79.8 kg   SpO2 98%   BMI 30.21 kg/m   Constitutional: Well developed, well nourished, no acute distress Eyes:  EOMI, conjunctiva normal bilaterally HENT: Normocephalic, atraumatic,mucus membranes moist Respiratory: Normal inspiratory effort Cardiovascular: Normal rate GI: nondistended skin: No rash, skin intact Musculoskeletal: Ankle nontender.  Positive area of erythema lateral foot at the base of the fifth  metatarsal.  Tenderness at the fourth and fifth metatarsals.  No tenderness over the rest of the foot.  No tenderness along the plantar surface of the foot.  No tenderness along the Achilles, calcaneus.  Pain aggravated with toe extension.  Patient able to dorsiflex/plantarflex without pain.  Skin intact over whole foot including between the toes. Neurologic: Alert & oriented x 3, no focal neuro deficits Psychiatric: Speech and behavior appropriate   ED Course   Medications - No data to display  Orders Placed This Encounter  Procedures  . DG Foot Complete Left    Standing Status:   Standing    Number of Occurrences:   1    Order Specific Question:   Reason for Exam (SYMPTOM  OR DIAGNOSIS REQUIRED)    Answer:   History of osteoporosis, tenderness over the fourth, fifth metatarsals.  Rule out fracture.    No results found for this or any previous visit (from the past 24 hour(s)). Dg Foot Complete Left  Result Date: 08/19/2019 CLINICAL DATA:  84 year old female with 4th and 5th metatarsal pain for 4 days with no known injury. Osteoporosis. EXAM: LEFT FOOT - COMPLETE 3+ VIEW COMPARISON:  None. FINDINGS: Calcified peripheral vascular disease. Bone mineralization is within normal limits for age. Mild degenerative spurring in the tarsal bones and at the 1st MTP, but only mild associated joint space loss. No fracture or periosteal reaction identified. Degenerative spurring  at the calcaneus. IMPRESSION: No acute osseous abnormality identified. Follow-up radiographs are recommended if symptoms persist. Calcified peripheral vascular disease. Electronically Signed   By: Genevie Ann M.D.   On: 08/19/2019 10:42    ED Clinical Impression  1. Foot pain, left      ED Assessment/Plan  Given history of osteopenia suspect fracture.  Checking foot x-ray.   Reviewed imaging independently.  No fracture or periosteal reaction identified.  Calcified peripheral vascular disease.  See radiology report for full details.   No apparent fracture on today's x-ray, will treat as if this is a skin infection due to the erythema.  Will send home with doxycycline 100 mg p.o. twice daily for 7 days.  She will need to follow-up with her primary care physician in several days if not getting any better for repeat x-ray.  She will go to the ER if she gets worse.  Discussed  imaging, MDM, treatment plan, and plan for follow-up with patient. Discussed sn/sx that should prompt return to the ED. patient agrees with plan.   Meds ordered this encounter  Medications  . doxycycline (VIBRAMYCIN) 100 MG capsule    Sig: Take 1 capsule (100 mg total) by mouth 2 (two) times daily for 7 days.    Dispense:  14 capsule    Refill:  0    *This clinic note was created using [employer]. Therefore, there may be occasional mistakes despite careful proofreading.   ?   Melynda Ripple, MD 08/19/19 1054

## 2019-08-19 NOTE — Discharge Instructions (Addendum)
X-ray was negative for fracture today, but if your symptoms persist, you may need another x-ray.  Sometimes fractures do not show up early on.  You can take 1 g of Tylenol 3 or 4 times a day as needed for pain.  I am going to treat this is that this is a skin infection.  Finish the antibiotics unless provider tells you to stop.  Follow-up with your primary care physician in 3 days if you are not getting any better, go to the ER for fevers above 100.4, if your foot turns blue or cold, or for any other concerns.

## 2019-08-19 NOTE — ED Triage Notes (Signed)
Patient c/o left foot pain and swelling that started on Sunday. She reports her toes are red and painful. Denies injury.

## 2019-08-21 ENCOUNTER — Other Ambulatory Visit: Payer: Self-pay | Admitting: Infectious Diseases

## 2019-08-21 ENCOUNTER — Other Ambulatory Visit: Payer: Self-pay

## 2019-08-21 ENCOUNTER — Ambulatory Visit
Admission: RE | Admit: 2019-08-21 | Discharge: 2019-08-21 | Disposition: A | Discharge status: Home / Self Care | Payer: Medicare Other | Source: Ambulatory Visit | Attending: Infectious Diseases | Admitting: Infectious Diseases

## 2019-08-21 ENCOUNTER — Encounter (INDEPENDENT_AMBULATORY_CARE_PROVIDER_SITE_OTHER): Payer: Self-pay

## 2019-08-21 DIAGNOSIS — M79672 Pain in left foot: Secondary | ICD-10-CM | POA: Insufficient documentation

## 2019-08-21 DIAGNOSIS — M7989 Other specified soft tissue disorders: Secondary | ICD-10-CM

## 2019-08-24 ENCOUNTER — Ambulatory Visit: Payer: Medicare Other | Admitting: Hematology and Oncology

## 2019-08-24 ENCOUNTER — Other Ambulatory Visit: Payer: Medicare Other

## 2019-08-26 ENCOUNTER — Encounter: Payer: Self-pay | Admitting: *Deleted

## 2019-08-26 DIAGNOSIS — Z952 Presence of prosthetic heart valve: Secondary | ICD-10-CM

## 2019-08-26 NOTE — Progress Notes (Signed)
Cardiac Individual Treatment Plan  Patient Details  Name: Hailey Ellison MRN: [id number] Date of Birth: [birth year] Referring Provider:     Cardiac Rehab from 07/22/2019 in [facility] Cardiac and Pulmonary Rehab  Referring Provider  Serafina Royals MD      Initial Encounter Date:    Cardiac Rehab from 07/22/2019 in [facility] Cardiac and Pulmonary Rehab  Date  07/22/19      Visit Diagnosis: S/P TAVR (transcatheter aortic valve replacement)  Patient's Home Medications on Admission:  Current Outpatient Medications:  .  acetaminophen (TYLENOL) 325 MG tablet, Take by mouth., Disp: , Rfl:  .  amLODipine (NORVASC) 5 MG tablet, Take by mouth., Disp: , Rfl:  .  anastrozole (ARIMIDEX) 1 MG tablet, TAKE 1 TABLET DAILY (Patient taking differently: Take 1 mg by mouth daily. ), Disp: 90 tablet, Rfl: 3 .  aspirin 81 MG chewable tablet, Chew 1 tablet (81 mg total) by mouth daily., Disp: 30 tablet, Rfl: 2 .  calcium citrate-vitamin D (CITRACAL+D) 315-200 MG-UNIT per tablet, Take 1 tablet by mouth 2 (two) times daily., Disp: , Rfl:  .  clopidogrel (PLAVIX) 75 MG tablet, Take by mouth., Disp: , Rfl:  .  doxycycline (VIBRAMYCIN) 100 MG capsule, Take 1 capsule (100 mg total) by mouth 2 (two) times daily for 7 days., Disp: 14 capsule, Rfl: 0 .  ergocalciferol (VITAMIN D2) 50000 units capsule, Take 50,000 Units by mouth every 30 (thirty) days. , Disp: , Rfl:  .  furosemide (LASIX) 20 MG tablet, Take 20 mg by mouth daily as needed for fluid. , Disp: , Rfl:  .  gabapentin (NEURONTIN) 300 MG capsule, 2 Capsules in the am and 3 capsules at night, Disp: , Rfl:  .  Glucosamine-Chondroit-Vit C-Mn (GLUCOSAMINE 1500 COMPLEX PO), Take 2 tablets by mouth daily., Disp: , Rfl:  .  lansoprazole (PREVACID) 30 MG capsule, Take 30 mg by mouth daily., Disp: , Rfl:  .  losartan (COZAAR) 50 MG tablet, Take 50 mg by mouth daily., Disp: , Rfl:  .  Multiple Vitamin (MULTI-VITAMIN) tablet, Take by mouth., Disp: , Rfl:  .  Multiple Vitamin  (MULTIVITAMIN WITH MINERALS) TABS, Take 1 tablet by mouth daily., Disp: , Rfl:  .  nystatin cream (MYCOSTATIN), Apply topically., Disp: , Rfl:  .  PARoxetine (PAXIL) 40 MG tablet, Take 40 mg by mouth at bedtime. , Disp: , Rfl:  .  rosuvastatin (CRESTOR) 20 MG tablet, Take 20 mg by mouth daily., Disp: , Rfl:  .  senna-docusate (SENOKOT-S) 8.6-50 MG tablet, Take by mouth., Disp: , Rfl:  .  triamcinolone cream (KENALOG) 0.5 %, Apply topically., Disp: , Rfl:  .  vitamin B-12 (CYANOCOBALAMIN) 500 MCG tablet, Take 500 mcg by mouth daily., Disp: , Rfl:   Past Medical History: Past Medical History:  Diagnosis Date  . Anxiety   . Arthritis   . Breast cancer ([place]) 2010   Adjuvant chemotherapy with Taxotere/Carboplatin/Herceptin. Arimidex initiated November 2011  . Cancer ([place]) 2010   T1c,N2a, M0; ER 90%, PR 60%,  HER-2/neu 2+ IHC, equivocal on fish. 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  Margins clear.  . Chronic kidney disease 2014  . Depression   . GERD (gastroesophageal reflux disease)   . Heart attack ([place]) 06/09/2016  . Hypertension   . Lymphedema    left arm  . Murmur   . Osteoporosis   . Personal history of  chemotherapy   . Radiation 2010   with chemo  . Squamous cell carcinoma June 2016   Left upper inner chest wall, area of previous radiation.  . Stroke ([place])    per pt, TIA a long time ago.  . Tinnitus     Tobacco Use: Social History   Tobacco Use  Smoking Status Former Smoker  . Types: Cigarettes  Smokeless Tobacco Never Used  Tobacco Comment   only smoked a few cigs throughout her lifetime    Labs: Recent Review Flowsheet Data    Labs for ITP Cardiac and Pulmonary Rehab Latest Ref Rng & Units 06/08/2016   Cholestrol 0 - 200 mg/dL 135   LDLCALC 0 - 99 mg/dL 81   HDL >40 mg/dL 47   Trlycerides <150 mg/dL 37       Exercise Target  Goals: Exercise Program Goal: Individual exercise prescription set using results from initial 6 min walk test and THRR while considering  patient's activity barriers and safety.   Exercise Prescription Goal: Initial exercise prescription builds to 30-45 minutes a day of aerobic activity, 2-3 days per week.  Home exercise guidelines will be given to patient during program as part of exercise prescription that the participant will acknowledge.  Activity Barriers & Risk Stratification: Activity Barriers & Cardiac Risk Stratification - 07/22/19 1428      Activity Barriers & Cardiac Risk Stratification   Activity Barriers  Shortness of Breath;Arthritis;Balance Concerns;Muscular Weakness;Deconditioning;Assistive Device;Left Knee Replacement    Cardiac Risk Stratification  High       6 Minute Walk: 6 Minute Walk    Row Name 07/22/19 1418         6 Minute Walk   Phase  Initial     Distance  830 feet     Walk Time  6 minutes     # of Rest Breaks  0     MPH  1.57     METS  1.36     RPE  11     Perceived Dyspnea   1     VO2 Peak  4.77     Symptoms  No     Resting HR  68 bpm     Resting BP  124/70     Resting Oxygen Saturation   97 %     Exercise Oxygen Saturation  during 6 min walk  98 %     Max Ex. HR  93 bpm     Max Ex. BP  154/74     2 Minute Post BP  130/64        Oxygen Initial Assessment:   Oxygen Re-Evaluation:   Oxygen Discharge (Final Oxygen Re-Evaluation):   Initial Exercise Prescription: Initial Exercise Prescription - 07/22/19 1400      Date of Initial Exercise RX and Referring Provider   Date  07/22/19    Referring Provider  Serafina Royals MD      NuStep   Level  1    SPM  80    Minutes  15    METs  1.5      Arm Ergometer   Level  1    RPM  50    Minutes  15    METs  1.5      Recumbant Elliptical   Level  1    RPM  50    Minutes  15    METs  1.5      Biostep-RELP   Level  1  SPM  50    Minutes  15    METs  1      Prescription  Details   Frequency (times per week)  2    Duration  Progress to 30 minutes of continuous aerobic without signs/symptoms of physical distress      Intensity   THRR 40-80% of Max Heartrate  96-125    Ratings of Perceived Exertion  11-13    Perceived Dyspnea  0-4      Progression   Progression  Continue to progress workloads to maintain intensity without signs/symptoms of physical distress.      Resistance Training   Training Prescription  Yes    Weight  3 lbs    Reps  10-15       Perform Capillary Blood Glucose checks as needed.  Exercise Prescription Changes: Exercise Prescription Changes    Row Name 07/22/19 1400 08/14/19 1100           Response to Exercise   Blood Pressure (Admit)  124/70  130/60      Blood Pressure (Exercise)  154/74  170/64      Blood Pressure (Exit)  130/64  112/60      Heart Rate (Admit)  68 bpm  86 bpm      Heart Rate (Exercise)  93 bpm  107 bpm      Heart Rate (Exit)  69 bpm  76 bpm      Oxygen Saturation (Admit)  97 %  -      Oxygen Saturation (Exercise)  98 %  -      Rating of Perceived Exertion (Exercise)  11  16      Perceived Dyspnea (Exercise)  1  -      Symptoms  wobbly at end, fatigued, SOB  -      Comments  walk test results  -      Duration  -  Progress to 30 minutes of  aerobic without signs/symptoms of physical distress      Intensity  -  THRR unchanged        Progression   Progression  -  Continue to progress workloads to maintain intensity without signs/symptoms of physical distress.      Average METs  -  2        Resistance Training   Training Prescription  -  Yes      Weight  -  2 lb      Reps  -  10-15        Interval Training   Interval Training  -  No        NuStep   Level  -  1      SPM  -  80      Minutes  -  15      METs  -  1.7        Arm Ergometer   Level  -  1      RPM  -  50      Minutes  -  15      METs  -  2        Biostep-RELP   Level  -  1      SPM  -  50      Minutes  -  15      METs  -  2          Exercise Comments:   Exercise Goals and Review: Exercise  Goals    Row Name 07/22/19 1437             Exercise Goals   Increase Physical Activity  Yes       Intervention  Provide advice, education, support and counseling about physical activity/exercise needs.;Develop an individualized exercise prescription for aerobic and resistive training based on initial evaluation findings, risk stratification, comorbidities and participant's personal goals.       Expected Outcomes  Short Term: Attend rehab on a regular basis to increase amount of physical activity.;Long Term: Add in home exercise to make exercise part of routine and to increase amount of physical activity.;Long Term: Exercising regularly at least 3-5 days a week.       Increase Strength and Stamina  Yes       Intervention  Provide advice, education, support and counseling about physical activity/exercise needs.;Develop an individualized exercise prescription for aerobic and resistive training based on initial evaluation findings, risk stratification, comorbidities and participant's personal goals.       Expected Outcomes  Short Term: Increase workloads from initial exercise prescription for resistance, speed, and METs.;Short Term: Perform resistance training exercises routinely during rehab and add in resistance training at home;Long Term: Improve cardiorespiratory fitness, muscular endurance and strength as measured by increased METs and functional capacity (6MWT)       Able to understand and use rate of perceived exertion (RPE) scale  Yes       Intervention  Provide education and explanation on how to use RPE scale       Expected Outcomes  Short Term: Able to use RPE daily in rehab to express subjective intensity level;Long Term:  Able to use RPE to guide intensity level when exercising independently       Able to understand and use Dyspnea scale  Yes       Intervention  Provide education and explanation on how to use Dyspnea  scale       Expected Outcomes  Short Term: Able to use Dyspnea scale daily in rehab to express subjective sense of shortness of breath during exertion;Long Term: Able to use Dyspnea scale to guide intensity level when exercising independently       Knowledge and understanding of Target Heart Rate Range (THRR)  Yes       Intervention  Provide education and explanation of THRR including how the numbers were predicted and where they are located for reference       Expected Outcomes  Short Term: Able to state/look up THRR;Short Term: Able to use daily as guideline for intensity in rehab;Long Term: Able to use THRR to govern intensity when exercising independently       Able to check pulse independently  Yes       Intervention  Provide education and demonstration on how to check pulse in carotid and radial arteries.;Review the importance of being able to check your own pulse for safety during independent exercise       Expected Outcomes  Short Term: Able to explain why pulse checking is important during independent exercise;Long Term: Able to check pulse independently and accurately       Understanding of Exercise Prescription  Yes       Intervention  Provide education, explanation, and written materials on patient's individual exercise prescription       Expected Outcomes  Short Term: Able to explain program exercise prescription;Long Term: Able to explain home exercise prescription to exercise independently  Exercise Goals Re-Evaluation : Exercise Goals Re-Evaluation    Row Name 07/27/19 1546 08/10/19 1527 08/14/19 1140         Exercise Goal Re-Evaluation   Exercise Goals Review  Increase Physical Activity;Able to understand and use rate of perceived exertion (RPE) scale;Knowledge and understanding of Target Heart Rate Range (THRR);Understanding of Exercise Prescription;Increase Strength and Stamina;Able to check pulse independently  Increase Physical Activity;Increase Strength and  Stamina;Able to understand and use rate of perceived exertion (RPE) scale;Knowledge and understanding of Target Heart Rate Range (THRR);Able to check pulse independently;Understanding of Exercise Prescription  Increase Physical Activity;Increase Strength and Stamina;Able to understand and use rate of perceived exertion (RPE) scale;Knowledge and understanding of Target Heart Rate Range (THRR);Able to check pulse independently;Understanding of Exercise Prescription     Comments  Reviewed RPE scale, THR and program prescription with pt today.  Pt voiced understanding and was given a copy of goals to take home.  Hailey Ellison is doing well in rehab.  She does give her laft arm rest breaks and has noticed some increased tenderness on that side, but will talk to her doctor about them.  She has noticed that she is starting to feel better overall.  Hailey Ellison has attended 4 session so far this month.  Regular attendance will help her get better results     Expected Outcomes  Short: Use RPE daily to regulate intensity. Long: Follow program prescription in THR.  -  Short - attend HT 2-3 times per week Long - increase overall MET level        Discharge Exercise Prescription (Final Exercise Prescription Changes): Exercise Prescription Changes - 08/14/19 1100      Response to Exercise   Blood Pressure (Admit)  130/60    Blood Pressure (Exercise)  170/64    Blood Pressure (Exit)  112/60    Heart Rate (Admit)  86 bpm    Heart Rate (Exercise)  107 bpm    Heart Rate (Exit)  76 bpm    Rating of Perceived Exertion (Exercise)  16    Duration  Progress to 30 minutes of  aerobic without signs/symptoms of physical distress    Intensity  THRR unchanged      Progression   Progression  Continue to progress workloads to maintain intensity without signs/symptoms of physical distress.    Average METs  2      Resistance Training   Training Prescription  Yes    Weight  2 lb    Reps  10-15      Interval Training   Interval  Training  No      NuStep   Level  1    SPM  80    Minutes  15    METs  1.7      Arm Ergometer   Level  1    RPM  50    Minutes  15    METs  2      Biostep-RELP   Level  1    SPM  50    Minutes  15    METs  2       Nutrition:  Target Goals: Understanding of nutrition guidelines, daily intake of sodium <1529m, cholesterol <2024m calories 30% from fat and 7% or less from saturated fats, daily to have 5 or more servings of fruits and vegetables.  Biometrics: Pre Biometrics - 07/22/19 1438      Pre Biometrics   Height  5' 4.4" (1.636 m)    Weight  179 lb 12.8 oz (81.6 kg)    BMI (Calculated)  30.47    Single Leg Stand  0.69 seconds        Nutrition Therapy Plan and Nutrition Goals: Nutrition Therapy & Goals - 07/22/19 1419      Nutrition Therapy   Diet  low na, HH, renal diet    Drug/Food Interactions  Statins/Certain Fruits   crestor   Protein (specify units)  66g    Fiber  25 grams    Whole Grain Foods  3 servings    Saturated Fats  12 max. grams    Fruits and Vegetables  5 servings/day    Sodium  1.5 grams      Personal Nutrition Goals   Nutrition Goal  ST: explore resources to determine what she would want to change LT: eat healthier, walk better    Comments  Pt on lasix, CKD stg 4, senokot, B12, Ca with vit D for osteopenia. Pt reports eating breakfast biscuit with sausage and cheese daily for B. Will have ice cream or chips and maybe a peanut butter and jelly sandwich later. Pt reports tryin to eat 1 whole egg everyday but is not doing that yet. Will sometimes have fried chciken sandwich or tenders. Pt reports not cooking.  Encouraged pt to increase protein. Discussed HH, renal, and low Na eating. Talked about healthy snack options.      Intervention Plan   Intervention  Prescribe, educate and counsel regarding individualized specific dietary modifications aiming towards targeted core components such as weight, hypertension, lipid management, diabetes, heart  failure and other comorbidities.;Nutrition handout(s) given to patient.   HH, low Na, renal   Expected Outcomes  Short Term Goal: Understand basic principles of dietary content, such as calories, fat, sodium, cholesterol and nutrients.;Short Term Goal: A plan has been developed with personal nutrition goals set during dietitian appointment.;Long Term Goal: Adherence to prescribed nutrition plan.       Nutrition Assessments: Nutrition Assessments - 07/22/19 1426      Rate Your Plate Scores   Pre Score  --      MEDFICTS Scores   Pre Score  52       Nutrition Goals Re-Evaluation: Nutrition Goals Re-Evaluation    Hailey Ellison Name 08/17/19 1547             Goals   Nutrition Goal  ST:eat scrambled eggs 3x/week LT: eat healthier, walk better       Comment  Discussed trying to eat moderate amounts of phosphorus and K+ and sodium to limit strain on kidneys. Pt eating lower than adequate amounts of protein--> eggs are something pt wants to increase. Pt reports trying a lot at once, discussed small chnages.       Expected Outcome  ST:eat scrambled eggs 3x/week LT: eat healthier, walk better          Nutrition Goals Discharge (Final Nutrition Goals Re-Evaluation): Nutrition Goals Re-Evaluation - 08/17/19 1547      Goals   Nutrition Goal  ST:eat scrambled eggs 3x/week LT: eat healthier, walk better    Comment  Discussed trying to eat moderate amounts of phosphorus and K+ and sodium to limit strain on kidneys. Pt eating lower than adequate amounts of protein--> eggs are something pt wants to increase. Pt reports trying a lot at once, discussed small chnages.    Expected Outcome  ST:eat scrambled eggs 3x/week LT: eat healthier, walk better       Psychosocial: Target Goals: Acknowledge presence or  absence of significant depression and/or stress, maximize coping skills, provide positive support system. Participant is able to verbalize types and ability to use techniques and skills needed for  reducing stress and depression.   Initial Review & Psychosocial Screening: Initial Psych Review & Screening - 07/20/19 1412      Initial Review   Current issues with  None Identified      Family Dynamics   Good Support System?  Yes   Daughters     Barriers   Psychosocial barriers to participate in program  There are no identifiable barriers or psychosocial needs.      Screening Interventions   Interventions  Encouraged to exercise;Provide feedback about the scores to participant;To provide support and resources with identified psychosocial needs    Expected Outcomes  Short Term goal: Utilizing psychosocial counselor, staff and physician to assist with identification of specific Stressors or current issues interfering with healing process. Setting desired goal for each stressor or current issue identified.;Long Term Goal: Stressors or current issues are controlled or eliminated.;Short Term goal: Identification and review with participant of any Quality of Life or Depression concerns found by scoring the questionnaire.;Long Term goal: The participant improves quality of Life and PHQ9 Scores as seen by post scores and/or verbalization of changes       Quality of Life Scores:  Quality of Life - 07/22/19 1447      Quality of Life   Select  Quality of Life      Quality of Life Scores   Health/Function Pre  20.46 %    Socioeconomic Pre  25 %    Psych/Spiritual Pre  25.71 %    Family Pre  25.5 %    GLOBAL Pre  23.18 %      Scores of 19 and below usually indicate a poorer quality of life in these areas.  A difference of  2-3 points is a clinically meaningful difference.  A difference of 2-3 points in the total score of the Quality of Life Index has been associated with significant improvement in overall quality of life, self-image, physical symptoms, and general health in studies assessing change in quality of life.  PHQ-9: Recent Review Flowsheet Data    Depression screen [facility] 2/9  07/22/2019 06/25/2016   Decreased Interest 2 1   Down, Depressed, Hopeless 1 0   PHQ - 2 Score 3 1   Altered sleeping 0 1   Tired, decreased energy 1 2   Change in appetite 1 3   Feeling bad or failure about yourself  0 0   Trouble concentrating 0 0   Moving slowly or fidgety/restless 0 0   Suicidal thoughts 0 -   PHQ-9 Score 5 7   Difficult doing work/chores Somewhat difficult Somewhat difficult     Interpretation of Total Score  Total Score Depression Severity:  1-4 = Minimal depression, 5-9 = Mild depression, 10-14 = Moderate depression, 15-19 = Moderately severe depression, 20-27 = Severe depression   Psychosocial Evaluation and Intervention:   Psychosocial Re-Evaluation: Psychosocial Re-Evaluation    Hailey Ellison Name 08/10/19 1546             Psychosocial Re-Evaluation   Current issues with  Current Stress Concerns       Comments  Hailey Ellison is doing well mentally.  She is glad to be back in rehab. Overall she is good. Her biggest issue currently is her knee giving her fits of pain.  Genrally she is a positve person.  She is  considering major eye surgery to repair a hole and damage and see the surgeon on 8/31 to determine the plan going forward.  She has vacation plans with her daughters in November in [place].       Expected Outcomes  Short: Continue to stay postive and make plans with surgeon.  Long: Continue to practice self care.       Interventions  Encouraged to attend Cardiac Rehabilitation for the exercise       Continue Psychosocial Services   Follow up required by staff          Psychosocial Discharge (Final Psychosocial Re-Evaluation): Psychosocial Re-Evaluation - 08/10/19 1546      Psychosocial Re-Evaluation   Current issues with  Current Stress Concerns    Comments  Hailey Ellison is doing well mentally.  She is glad to be back in rehab. Overall she is good. Her biggest issue currently is her knee giving her fits of pain.  Genrally she is a positve person.  She is  considering major eye surgery to repair a hole and damage and see the surgeon on 8/31 to determine the plan going forward.  She has vacation plans with her daughters in November in [place].    Expected Outcomes  Short: Continue to stay postive and make plans with surgeon.  Long: Continue to practice self care.    Interventions  Encouraged to attend Cardiac Rehabilitation for the exercise    Continue Psychosocial Services   Follow up required by staff       Vocational Rehabilitation: Provide vocational rehab assistance to qualifying candidates.   Vocational Rehab Evaluation & Intervention:   Education: Education Goals: Education classes will be provided on a variety of topics geared toward better understanding of heart health and risk factor modification. Participant will state understanding/return demonstration of topics presented as noted by education test scores.  Learning Barriers/Preferences: Learning Barriers/Preferences - 07/20/19 1414      Learning Barriers/Preferences   Learning Barriers  None   Eye surgeries  First eye due end of August. Glasses not working well presently for reading   Gaffer Material       Education Topics:  AED/CPR: - Group verbal and written instruction with the use of models to demonstrate the basic use of the AED with the basic ABC's of resuscitation.   General Nutrition Guidelines/Fats and Fiber: -Group instruction provided by verbal, written material, models and posters to present the general guidelines for heart healthy nutrition. Gives an explanation and review of dietary fats and fiber.   Controlling Sodium/Reading Food Labels: -Group verbal and written material supporting the discussion of sodium use in heart healthy nutrition. Review and explanation with models, verbal and written materials for utilization of the food label.   Exercise Physiology & General Exercise Guidelines: - Group verbal and written  instruction with models to review the exercise physiology of the cardiovascular system and associated critical values. Provides general exercise guidelines with specific guidelines to those with heart or lung disease.    Aerobic Exercise & Resistance Training: - Gives group verbal and written instruction on the various components of exercise. Focuses on aerobic and resistive training programs and the benefits of this training and how to safely progress through these programs..   Flexibility, Balance, Mind/Body Relaxation: Provides group verbal/written instruction on the benefits of flexibility and balance training, including mind/body exercise modes such as yoga, pilates and tai chi.  Demonstration and skill practice provided.   Stress and Anxiety: -  Provides group verbal and written instruction about the health risks of elevated stress and causes of high stress.  Discuss the correlation between heart/lung disease and anxiety and treatment options. Review healthy ways to manage with stress and anxiety.   Depression: - Provides group verbal and written instruction on the correlation between heart/lung disease and depressed mood, treatment options, and the stigmas associated with seeking treatment.   Anatomy & Physiology of the Heart: - Group verbal and written instruction and models provide basic cardiac anatomy and physiology, with the coronary electrical and arterial systems. Review of Valvular disease and Heart Failure   Cardiac Rehab from 08/13/2016 in [facility] Cardiac and Pulmonary Rehab  Date  08/13/16  Educator  CE  Instruction Review Code (retired)  2- meets goals/outcomes      Cardiac Procedures: - Group verbal and written instruction to review commonly prescribed medications for heart disease. Reviews the medication, class of the drug, and side effects. Includes the steps to properly store meds and maintain the prescription regimen. (beta blockers and nitrates)   Cardiac  Medications I: - Group verbal and written instruction to review commonly prescribed medications for heart disease. Reviews the medication, class of the drug, and side effects. Includes the steps to properly store meds and maintain the prescription regimen.   Cardiac Medications II: -Group verbal and written instruction to review commonly prescribed medications for heart disease. Reviews the medication, class of the drug, and side effects. (all other drug classes)    Go Sex-Intimacy & Heart Disease, Get SMART - Goal Setting: - Group verbal and written instruction through game format to discuss heart disease and the return to sexual intimacy. Provides group verbal and written material to discuss and apply goal setting through the application of the S.M.A.R.T. Method.   Other Matters of the Heart: - Provides group verbal, written materials and models to describe Stable Angina and Peripheral Artery. Includes description of the disease process and treatment options available to the cardiac patient.   Cardiac Rehab from 08/13/2016 in [facility] Cardiac and Pulmonary Rehab  Date  08/13/16  Educator  CE  Instruction Review Code (retired)  2- meets goals/outcomes      Exercise & Equipment Safety: - Individual verbal instruction and demonstration of equipment use and safety with use of the equipment.   Cardiac Rehab from 07/22/2019 in [facility] Cardiac and Pulmonary Rehab  Date  07/22/19  Educator  [facility]  Instruction Review Code  1- Verbalizes Understanding      Infection Prevention: - Provides verbal and written material to individual with discussion of infection control including proper hand washing and proper equipment cleaning during exercise session.   Cardiac Rehab from 07/22/2019 in [facility] Cardiac and Pulmonary Rehab  Date  07/22/19  Educator  [facility]  Instruction Review Code  1- Verbalizes Understanding      Falls Prevention: - Provides verbal and written material to individual with discussion of  falls prevention and safety.   Cardiac Rehab from 07/22/2019 in [facility] Cardiac and Pulmonary Rehab  Date  07/22/19  Educator  [facility]  Instruction Review Code  1- Verbalizes Understanding      Diabetes: - Individual verbal and written instruction to review signs/symptoms of diabetes, desired ranges of glucose level fasting, after meals and with exercise. Acknowledge that pre and post exercise glucose checks will be done for 3 sessions at entry of program.   Know Your Numbers and Risk Factors: -Group verbal and written instruction about important numbers in your health.  Discussion of  what are risk factors and how they play a role in the disease process.  Review of Cholesterol, Blood Pressure, Diabetes, and BMI and the role they play in your overall health.   Sleep Hygiene: -Provides group verbal and written instruction about how sleep can affect your health.  Define sleep hygiene, discuss sleep cycles and impact of sleep habits. Review good sleep hygiene tips.    Other: -Provides group and verbal instruction on various topics (see comments)   Knowledge Questionnaire Score: Knowledge Questionnaire Score - 07/22/19 1448      Knowledge Questionnaire Score   Pre Score  23/26   Education Focus: Angina, Nutrition      Core Components/Risk Factors/Patient Goals at Admission: Personal Goals and Risk Factors at Admission - 07/22/19 1438      Core Components/Risk Factors/Patient Goals on Admission    Weight Management  Yes;Weight Loss    Intervention  Weight Management: Develop a combined nutrition and exercise program designed to reach desired caloric intake, while maintaining appropriate intake of nutrient and fiber, sodium and fats, and appropriate energy expenditure required for the weight goal.;Weight Management: Provide education and appropriate resources to help participant work on and attain dietary goals.    Admit Weight  179 lb 12.8 oz (81.6 kg)    Goal Weight: Short Term  175 lb  (79.4 kg)    Goal Weight: Long Term  169 lb (76.7 kg)    Expected Outcomes  Short Term: Continue to assess and modify interventions until short term weight is achieved;Long Term: Adherence to nutrition and physical activity/exercise program aimed toward attainment of established weight goal;Weight Loss: Understanding of general recommendations for a balanced deficit meal plan, which promotes 1-2 lb weight loss per week and includes a negative energy balance of [phone] kcal/d;Understanding recommendations for meals to include 15-35% energy as protein, 25-35% energy from fat, 35-60% energy from carbohydrates, less than 225m of dietary cholesterol, 20-35 gm of total fiber daily;Understanding of distribution of calorie intake throughout the day with the consumption of 4-5 meals/snacks    Hypertension  Yes    Intervention  Provide education on lifestyle modifcations including regular physical activity/exercise, weight management, moderate sodium restriction and increased consumption of fresh fruit, vegetables, and low fat dairy, alcohol moderation, and smoking cessation.;Monitor prescription use compliance.    Expected Outcomes  Short Term: Continued assessment and intervention until BP is < 140/963mHG in hypertensive participants. < 130/8018mG in hypertensive participants with diabetes, heart failure or chronic kidney disease.;Long Term: Maintenance of blood pressure at goal levels.    Lipids  Yes    Intervention  Provide education and support for participant on nutrition & aerobic/resistive exercise along with prescribed medications to achieve LDL <84m72mDL >40mg60m Expected Outcomes  Short Term: Participant states understanding of desired cholesterol values and is compliant with medications prescribed. Participant is following exercise prescription and nutrition guidelines.;Long Term: Cholesterol controlled with medications as prescribed, with individualized exercise RX and with personalized nutrition  plan. Value goals: LDL < 84mg,68m > 40 mg.       Core Components/Risk Factors/Patient Goals Review:  Goals and Risk Factor Review    Row Name 08/10/19 1549             Core Components/Risk Factors/Patient Goals Review   Personal Goals Review  Weight Management/Obesity;Lipids;Hypertension       Review  SylviaDestyneing well in rehab. Her weight has been steady overall.  She will occasionally bouce a couple of  pounds, but levels back out.  Her pressures have been good and she checks them daily at home.  The past week they have been in the 130s/70s.       Expected Outcomes  Short: Continue to work on weight loss.  Long: Continue to monitor risk factors.          Core Components/Risk Factors/Patient Goals at Discharge (Final Review):  Goals and Risk Factor Review - 08/10/19 1549      Core Components/Risk Factors/Patient Goals Review   Personal Goals Review  Weight Management/Obesity;Lipids;Hypertension    Review  Hailey Ellison is doing well in rehab. Her weight has been steady overall.  She will occasionally bouce a couple of pounds, but levels back out.  Her pressures have been good and she checks them daily at home.  The past week they have been in the 130s/70s.    Expected Outcomes  Short: Continue to work on weight loss.  Long: Continue to monitor risk factors.       ITP Comments: ITP Comments    Row Name 07/20/19 1405 07/22/19 1418 07/27/19 1545 08/05/19 0606 08/26/19 1048   ITP Comments  Orientation Virtual Call completed today. EP/RD appt on 7/29.     Documentation of diagnosis can be found in [facility] 6/7.  Completed 6MWT, gym orientation, and RD eval today.  Initial ITP created and sent to Dr. Emily Filbert, Medical director for review.  First full day of exercise!  Patient was oriented to gym and equipment including functions, settings, policies, and procedures.  Patient's individual exercise prescription and treatment plan were reviewed.  All starting workloads were established based on the  results of the 6 minute walk test done at initial orientation visit.  The plan for exercise progression was also introduced and progression will be customized based on patient's performance and goals.  30 Day Review Completed today. Continue with ITP unless changed by Medical Director review.  New to program  Hailey Ellison is having issues with gout in her foot, doesnt feel like she can come back for a while, and scheduled eye surgery the end of October. She will be discharged until ready and then a new referral can be sent.      Comments: Discharge ITP

## 2019-08-26 NOTE — Progress Notes (Signed)
Discharge Progress Report  Patient Details  Name: Hailey Ellison MRN: [id number] Date of Birth: 02-27-1935 Referring Provider:     Cardiac Rehab from 07/22/2019 in [facility] Cardiac and Pulmonary Rehab  Referring Provider  Hailey Royals MD       Number of Visits: 7  Reason for Discharge:  Early Exit:  Personal; Hailey Ellison is having issues with gout in her foot, doesnt feel like she can come back for a while, and scheduled eye surgery the end of October. She will be discharged until ready and then a new referral can be sent.  Smoking History:  Social History   Tobacco Use  Smoking Status Former Smoker  . Types: Cigarettes  Smokeless Tobacco Never Used  Tobacco Comment   only smoked a few cigs throughout her lifetime    Diagnosis:  S/P TAVR (transcatheter aortic valve replacement)  ADL UCSD:   Initial Exercise Prescription: Initial Exercise Prescription - 07/22/19 1400      Date of Initial Exercise RX and Referring Provider   Date  07/22/19    Referring Provider  Hailey Royals MD      NuStep   Level  1    SPM  80    Minutes  15    METs  1.5      Arm Ergometer   Level  1    RPM  50    Minutes  15    METs  1.5      Recumbant Elliptical   Level  1    RPM  50    Minutes  15    METs  1.5      Biostep-RELP   Level  1    SPM  50    Minutes  15    METs  1      Prescription Details   Frequency (times per week)  2    Duration  Progress to 30 minutes of continuous aerobic without signs/symptoms of physical distress      Intensity   THRR 40-80% of Max Heartrate  96-125    Ratings of Perceived Exertion  11-13    Perceived Dyspnea  0-4      Progression   Progression  Continue to progress workloads to maintain intensity without signs/symptoms of physical distress.      Resistance Training   Training Prescription  Yes    Weight  3 lbs    Reps  10-15       Discharge Exercise Prescription (Final Exercise Prescription Changes): Exercise Prescription Changes -  08/14/19 1100      Response to Exercise   Blood Pressure (Admit)  130/60    Blood Pressure (Exercise)  170/64    Blood Pressure (Exit)  112/60    Heart Rate (Admit)  86 bpm    Heart Rate (Exercise)  107 bpm    Heart Rate (Exit)  76 bpm    Rating of Perceived Exertion (Exercise)  16    Duration  Progress to 30 minutes of  aerobic without signs/symptoms of physical distress    Intensity  THRR unchanged      Progression   Progression  Continue to progress workloads to maintain intensity without signs/symptoms of physical distress.    Average METs  2      Resistance Training   Training Prescription  Yes    Weight  2 lb    Reps  10-15      Interval Training   Interval Training  No  NuStep   Level  1    SPM  80    Minutes  15    METs  1.7      Arm Ergometer   Level  1    RPM  50    Minutes  15    METs  2      Biostep-RELP   Level  1    SPM  50    Minutes  15    METs  2       Functional Capacity: 6 Minute Walk    Row Name 07/22/19 1418         6 Minute Walk   Phase  Initial     Distance  830 feet     Walk Time  6 minutes     # of Rest Breaks  0     MPH  1.57     METS  1.36     RPE  11     Perceived Dyspnea   1     VO2 Peak  4.77     Symptoms  No     Resting HR  68 bpm     Resting BP  124/70     Resting Oxygen Saturation   97 %     Exercise Oxygen Saturation  during 6 min walk  98 %     Max Ex. HR  93 bpm     Max Ex. BP  154/74     2 Minute Post BP  130/64        Psychological, QOL, Others - Outcomes: PHQ 2/9: Depression screen [facility] 2/9 07/22/2019 06/25/2016  Decreased Interest 2 1  Down, Depressed, Hopeless 1 0  PHQ - 2 Score 3 1  Altered sleeping 0 1  Tired, decreased energy 1 2  Change in appetite 1 3  Feeling bad or failure about yourself  0 0  Trouble concentrating 0 0  Moving slowly or fidgety/restless 0 0  Suicidal thoughts 0 -  PHQ-9 Score 5 7  Difficult doing work/chores Somewhat difficult Somewhat difficult  Some recent data  might be hidden    Quality of Life: Quality of Life - 07/22/19 1447      Quality of Life   Select  Quality of Life      Quality of Life Scores   Health/Function Pre  20.46 %    Socioeconomic Pre  25 %    Psych/Spiritual Pre  25.71 %    Family Pre  25.5 %    GLOBAL Pre  23.18 %       Personal Goals: Goals established at orientation with interventions provided to work toward goal. Personal Goals and Risk Factors at Admission - 07/22/19 1438      Core Components/Risk Factors/Patient Goals on Admission    Weight Management  Yes;Weight Loss    Intervention  Weight Management: Develop a combined nutrition and exercise program designed to reach desired caloric intake, while maintaining appropriate intake of nutrient and fiber, sodium and fats, and appropriate energy expenditure required for the weight goal.;Weight Management: Provide education and appropriate resources to help participant work on and attain dietary goals.    Admit Weight  179 lb 12.8 oz (81.6 kg)    Goal Weight: Short Term  175 lb (79.4 kg)    Goal Weight: Long Term  169 lb (76.7 kg)    Expected Outcomes  Short Term: Continue to assess and modify interventions until short term weight is achieved;Long Term: Adherence  to nutrition and physical activity/exercise program aimed toward attainment of established weight goal;Weight Loss: Understanding of general recommendations for a balanced deficit meal plan, which promotes 1-2 lb weight loss per week and includes a negative energy balance of [phone] kcal/d;Understanding recommendations for meals to include 15-35% energy as protein, 25-35% energy from fat, 35-60% energy from carbohydrates, less than '200mg'$  of dietary cholesterol, 20-35 gm of total fiber daily;Understanding of distribution of calorie intake throughout the day with the consumption of 4-5 meals/snacks    Hypertension  Yes    Intervention  Provide education on lifestyle modifcations including regular physical  activity/exercise, weight management, moderate sodium restriction and increased consumption of fresh fruit, vegetables, and low fat dairy, alcohol moderation, and smoking cessation.;Monitor prescription use compliance.    Expected Outcomes  Short Term: Continued assessment and intervention until BP is < 140/1m HG in hypertensive participants. < 130/88mHG in hypertensive participants with diabetes, heart failure or chronic kidney disease.;Long Term: Maintenance of blood pressure at goal levels.    Lipids  Yes    Intervention  Provide education and support for participant on nutrition & aerobic/resistive exercise along with prescribed medications to achieve LDL '70mg'$ , HDL >'40mg'$ .    Expected Outcomes  Short Term: Participant states understanding of desired cholesterol values and is compliant with medications prescribed. Participant is following exercise prescription and nutrition guidelines.;Long Term: Cholesterol controlled with medications as prescribed, with individualized exercise RX and with personalized nutrition plan. Value goals: LDL < '70mg'$ , HDL > 40 mg.        Personal Goals Discharge: Goals and Risk Factor Review    Row Name 08/10/19 1549             Core Components/Risk Factors/Patient Goals Review   Personal Goals Review  Weight Management/Obesity;Lipids;Hypertension       Review  SyPinas doing well in rehab. Her weight has been steady overall.  She will occasionally bouce a couple of pounds, but levels back out.  Her pressures have been good and she checks them daily at home.  The past week they have been in the 130s/70s.       Expected Outcomes  Short: Continue to work on weight loss.  Long: Continue to monitor risk factors.          Exercise Goals and Review: Exercise Goals    Row Name 07/22/19 1437             Exercise Goals   Increase Physical Activity  Yes       Intervention  Provide advice, education, support and counseling about physical activity/exercise  needs.;Develop an individualized exercise prescription for aerobic and resistive training based on initial evaluation findings, risk stratification, comorbidities and participant's personal goals.       Expected Outcomes  Short Term: Attend rehab on a regular basis to increase amount of physical activity.;Long Term: Add in home exercise to make exercise part of routine and to increase amount of physical activity.;Long Term: Exercising regularly at least 3-5 days a week.       Increase Strength and Stamina  Yes       Intervention  Provide advice, education, support and counseling about physical activity/exercise needs.;Develop an individualized exercise prescription for aerobic and resistive training based on initial evaluation findings, risk stratification, comorbidities and participant's personal goals.       Expected Outcomes  Short Term: Increase workloads from initial exercise prescription for resistance, speed, and METs.;Short Term: Perform resistance training exercises routinely during rehab and add  in resistance training at home;Long Term: Improve cardiorespiratory fitness, muscular endurance and strength as measured by increased METs and functional capacity (6MWT)       Able to understand and use rate of perceived exertion (RPE) scale  Yes       Intervention  Provide education and explanation on how to use RPE scale       Expected Outcomes  Short Term: Able to use RPE daily in rehab to express subjective intensity level;Long Term:  Able to use RPE to guide intensity level when exercising independently       Able to understand and use Dyspnea scale  Yes       Intervention  Provide education and explanation on how to use Dyspnea scale       Expected Outcomes  Short Term: Able to use Dyspnea scale daily in rehab to express subjective sense of shortness of breath during exertion;Long Term: Able to use Dyspnea scale to guide intensity level when exercising independently       Knowledge and  understanding of Target Heart Rate Range (THRR)  Yes       Intervention  Provide education and explanation of THRR including how the numbers were predicted and where they are located for reference       Expected Outcomes  Short Term: Able to state/look up THRR;Short Term: Able to use daily as guideline for intensity in rehab;Long Term: Able to use THRR to govern intensity when exercising independently       Able to check pulse independently  Yes       Intervention  Provide education and demonstration on how to check pulse in carotid and radial arteries.;Review the importance of being able to check your own pulse for safety during independent exercise       Expected Outcomes  Short Term: Able to explain why pulse checking is important during independent exercise;Long Term: Able to check pulse independently and accurately       Understanding of Exercise Prescription  Yes       Intervention  Provide education, explanation, and written materials on patient's individual exercise prescription       Expected Outcomes  Short Term: Able to explain program exercise prescription;Long Term: Able to explain home exercise prescription to exercise independently          Exercise Goals Re-Evaluation: Exercise Goals Re-Evaluation    Row Name 07/27/19 1546 08/10/19 1527 08/14/19 1140         Exercise Goal Re-Evaluation   Exercise Goals Review  Increase Physical Activity;Able to understand and use rate of perceived exertion (RPE) scale;Knowledge and understanding of Target Heart Rate Range (THRR);Understanding of Exercise Prescription;Increase Strength and Stamina;Able to check pulse independently  Increase Physical Activity;Increase Strength and Stamina;Able to understand and use rate of perceived exertion (RPE) scale;Knowledge and understanding of Target Heart Rate Range (THRR);Able to check pulse independently;Understanding of Exercise Prescription  Increase Physical Activity;Increase Strength and Stamina;Able to  understand and use rate of perceived exertion (RPE) scale;Knowledge and understanding of Target Heart Rate Range (THRR);Able to check pulse independently;Understanding of Exercise Prescription     Comments  Reviewed RPE scale, THR and program prescription with pt today.  Pt voiced understanding and was given a copy of goals to take home.  Retal is doing well in rehab.  She does give her laft arm rest breaks and has noticed some increased tenderness on that side, but will talk to her doctor about them.  She has noticed that she is  starting to feel better overall.  Harper has attended 4 session so far this month.  Regular attendance will help her get better results     Expected Outcomes  Short: Use RPE daily to regulate intensity. Long: Follow program prescription in THR.  -  Short - attend HT 2-3 times per week Long - increase overall MET level        Nutrition & Weight - Outcomes: Pre Biometrics - 07/22/19 1438      Pre Biometrics   Height  5' 4.4" (1.636 m)    Weight  179 lb 12.8 oz (81.6 kg)    BMI (Calculated)  30.47    Single Leg Stand  0.69 seconds        Nutrition: Nutrition Therapy & Goals - 07/22/19 1419      Nutrition Therapy   Diet  low na, HH, renal diet    Drug/Food Interactions  Statins/Certain Fruits   crestor   Protein (specify units)  66g    Fiber  25 grams    Whole Grain Foods  3 servings    Saturated Fats  12 max. grams    Fruits and Vegetables  5 servings/day    Sodium  1.5 grams      Personal Nutrition Goals   Nutrition Goal  ST: explore resources to determine what she would want to change LT: eat healthier, walk better    Comments  Pt on lasix, CKD stg 4, senokot, B12, Ca with vit D for osteopenia. Pt reports eating breakfast biscuit with sausage and cheese daily for B. Will have ice cream or chips and maybe a peanut butter and jelly sandwich later. Pt reports tryin to eat 1 whole egg everyday but is not doing that yet. Will sometimes have fried chciken  sandwich or tenders. Pt reports not cooking.  Encouraged pt to increase protein. Discussed HH, renal, and low Na eating. Talked about healthy snack options.      Intervention Plan   Intervention  Prescribe, educate and counsel regarding individualized specific dietary modifications aiming towards targeted core components such as weight, hypertension, lipid management, diabetes, heart failure and other comorbidities.;Nutrition handout(s) given to patient.   HH, low Na, renal   Expected Outcomes  Short Term Goal: Understand basic principles of dietary content, such as calories, fat, sodium, cholesterol and nutrients.;Short Term Goal: A plan has been developed with personal nutrition goals set during dietitian appointment.;Long Term Goal: Adherence to prescribed nutrition plan.       Nutrition Discharge: Nutrition Assessments - 07/22/19 1426      Rate Your Plate Scores   Pre Score  --      MEDFICTS Scores   Pre Score  52       Education Questionnaire Score: Knowledge Questionnaire Score - 07/22/19 1448      Knowledge Questionnaire Score   Pre Score  23/26   Education Focus: Angina, Nutrition      Goals reviewed with patient; copy given to patient.

## 2019-09-07 ENCOUNTER — Other Ambulatory Visit: Payer: Medicare Other

## 2019-09-07 ENCOUNTER — Ambulatory Visit: Payer: Medicare Other | Admitting: Oncology

## 2019-09-07 ENCOUNTER — Ambulatory Visit: Payer: Medicare Other | Admitting: Hematology and Oncology

## 2019-09-22 NOTE — Progress Notes (Signed)
[facility]  [address], [address] [place], [place] [address] Phone: [phone]  Fax: [phone]   Clinic Day:  09/24/2019  Referring physician: [name], MD  Chief Complaint: Hailey Ellison is a 84 y.o. female with stage II left breast cancer who is seen for 1 year assessment.  HPI: The patient was last seen in the medical oncology clinic on 09/01/2018. At that time, she denied any breast concerns. Exam was stable. Platelet count was 148,000. BUN was 31 and creatinine 1.70. CA 27.29 was 21.2.   Bone density on 09/25/2018 revealed osteopenia with a T-score on -1.9 at the left neck femur.   Patient had a lymphedema follow up with Dr. Bary Castilla on 02/202/2020. She noted some hand swelling. She was referred back to occupational therapy with Luna Fuse and to obtain a new hand and sleeve compressive garments.   She underwent cardiac catheterization on 04/23/2019 by Dr. Nehemiah Massed. She had aortic valve replacement at [facility] on 06/01/2019.   Bilateral mammogram on 07/07/2019 showed no evidence of malignancy.   She had a follow up with Dr. Hampton Abbot on 07/31/2019. He reviewed mammogram results. She was doing well without any issues.  She was to continue to work with PT/OT for her lymphedema.  Yearly follow-up with her PCP was recommended.   During the interim, she has done well. She notes right sided neck pain. Pain increases with movement. She is taking pain medication for her right hip. She notes some swelling and tenderness in her ankles.  Her creatinine clearence is 24.5 ml/min; her nephrologist is Dr. Juleen China.   Past Medical History:  Diagnosis Date  . Anxiety   . Arthritis   . Breast cancer ([place]) 2010   Adjuvant chemotherapy with Taxotere/Carboplatin/Herceptin. Arimidex initiated November 2011  . Cancer ([place]) 2010   T1c,N2a, M0; ER 90%, PR 60%,  HER-2/neu 2+ IHC, equivocal on fish. T1N2M0 (clinical stage IIIA) grade 3 invasive ductal carcinoma of  the left breast status post lumpectomy, sentinel node and axillary node dissection March 09, 2009.  Margins clear.  . Chronic kidney disease 2014  . Depression   . GERD (gastroesophageal reflux disease)   . Heart attack ([place]) 06/09/2016  . Hypertension   . Lymphedema    left arm  . Murmur   . Osteoporosis   . Personal history of chemotherapy   . Radiation 2010   with chemo  . Squamous cell carcinoma June 2016   Left upper inner chest wall, area of previous radiation.  . Stroke ([place])    per pt, TIA a long time ago.  . Tinnitus     Past Surgical History:  Procedure Laterality Date  . ABDOMINAL HYSTERECTOMY    . BREAST BIOPSY Left 2010   DCIS  . BREAST BIOPSY Right    neg  . BREAST CYST EXCISION    . BREAST EXCISIONAL BIOPSY Left 07/26/2017   neg/fat necrosis  . BREAST LUMPECTOMY Left 2010  . CARDIAC CATHETERIZATION N/A 06/08/2016   Procedure: Left Heart Cath and Coronary Angiography;  Surgeon: Wellington Hampshire, MD;  Location: [facility];  Service: Cardiovascular;  Laterality: N/A;  . CARDIAC CATHETERIZATION N/A 06/08/2016   Procedure: Coronary Stent Intervention;  Surgeon: Wellington Hampshire, MD;  Location: [facility];  Service: Cardiovascular;  Laterality: N/A;  . CARPAL TUNNEL RELEASE Bilateral   . CHOLECYSTECTOMY    . JOINT REPLACEMENT Left 2008   Total Knee Replacement  . MASTECTOMY, PARTIAL Left 07/26/2017  Procedure: MASTECTOMY PARTIAL;  Surgeon: Robert Bellow, MD;  Location: [facility];  Service: General;  Laterality: Left;  . PARTIAL HYSTERECTOMY  1980's  . PORT-A-CATH REMOVAL    . RIGHT/LEFT HEART CATH AND CORONARY ANGIOGRAPHY N/A 04/23/2019   Procedure: RIGHT/LEFT HEART CATH AND CORONARY ANGIOGRAPHY;  Surgeon: Corey Skains, MD;  Location: [facility];  Service: Cardiovascular;  Laterality: N/A;    Family History  Problem Relation Age of Onset  . Cancer Sister        breast  . Cancer Sister        kidney  . Hypertension Mother    . Hypertension Father   . Breast cancer Neg Hx     Social History:  reports that she has quit smoking. Her smoking use included cigarettes. She has never used smokeless tobacco. She reports that she does not drink alcohol or use drugs. She lives in [place]. Her daughter is on dialysis. Her other daughter has early onset dementia. The patient is alone today.  Allergies:  Allergies  Allergen Reactions  . Alendronate Shortness Of Breath  . Benadryl [Diphenhydramine Hcl] Hives  . Tegaderm Ag Mesh [Silver]     tegaderm causes rash  . Neosporin [Neomycin-Bacitracin Zn-Polymyx] Rash    Tolerates Polysporin(bacitracin zinc - polymyxin B)  . Sulfa Antibiotics Rash  . Tape Rash    Tape and bandaids    Current Medications: Current Outpatient Medications  Medication Sig Dispense Refill  . acetaminophen (TYLENOL) 325 MG tablet Take by mouth.    Marland Kitchen amLODipine (NORVASC) 5 MG tablet Take by mouth.    Marland Kitchen anastrozole (ARIMIDEX) 1 MG tablet TAKE 1 TABLET DAILY (Patient taking differently: Take 1 mg by mouth daily. ) 90 tablet 3  . aspirin 81 MG chewable tablet Chew 1 tablet (81 mg total) by mouth daily. 30 tablet 2  . calcium citrate-vitamin D (CITRACAL+D) 315-200 MG-UNIT per tablet Take 1 tablet by mouth 2 (two) times daily.    . clopidogrel (PLAVIX) 75 MG tablet Take by mouth.    . ergocalciferol (VITAMIN D2) 50000 units capsule Take 50,000 Units by mouth every 30 (thirty) days.     . furosemide (LASIX) 20 MG tablet Take 20 mg by mouth daily as needed for fluid.     Marland Kitchen gabapentin (NEURONTIN) 300 MG capsule 2 Capsules in the am and 3 capsules at night    . Glucosamine-Chondroit-Vit C-Mn (GLUCOSAMINE 1500 COMPLEX PO) Take 2 tablets by mouth daily.    . lansoprazole (PREVACID) 30 MG capsule Take 30 mg by mouth daily.    Marland Kitchen losartan (COZAAR) 50 MG tablet Take 50 mg by mouth daily.    . Multiple Vitamin (MULTI-VITAMIN) tablet Take by mouth.    . Multiple Vitamin (MULTIVITAMIN WITH MINERALS) TABS Take 1  tablet by mouth daily.    Marland Kitchen PARoxetine (PAXIL) 40 MG tablet Take 40 mg by mouth at bedtime.     . rosuvastatin (CRESTOR) 20 MG tablet Take 20 mg by mouth daily.    Marland Kitchen senna-docusate (SENOKOT-S) 8.6-50 MG tablet Take by mouth.    . triamcinolone cream (KENALOG) 0.5 % Apply topically.    . vitamin B-12 (CYANOCOBALAMIN) 500 MCG tablet Take 500 mcg by mouth daily.    Marland Kitchen nystatin cream (MYCOSTATIN) Apply topically.     No current facility-administered medications for this visit.     Review of Systems  Constitutional: Positive for weight loss (2 pounds in past year). Negative for chills, diaphoresis, fever and malaise/fatigue.  Doing well.  HENT: Negative.  Negative for congestion, ear discharge, ear pain, hearing loss, nosebleeds, sinus pain and sore throat.   Eyes: Negative.  Negative for blurred vision and double vision.  Respiratory: Negative for cough, hemoptysis, sputum production, shortness of breath and wheezing.   Cardiovascular: Positive for leg swelling (ankles). Negative for chest pain and palpitations.       Aortic valve replacement 06/01/2019.  Gastrointestinal: Negative.  Negative for abdominal pain, blood in stool, constipation, diarrhea, melena, nausea and vomiting.  Genitourinary: Negative.  Negative for dysuria, flank pain, frequency, hematuria and urgency.  Musculoskeletal: Positive for joint pain (right hip) and neck pain (right side, decreased ROM). Negative for back pain and myalgias.  Skin: Negative.  Negative for itching and rash.  Neurological: Negative.  Negative for dizziness, tingling, sensory change, speech change, focal weakness, weakness and headaches.  Endo/Heme/Allergies: Negative.  Does not bruise/bleed easily.  Psychiatric/Behavioral: Negative.  Negative for depression and memory loss. The patient is not nervous/anxious and does not have insomnia.   All other systems reviewed and are negative.  Performance status (ECOG): 1  Vitals Blood pressure 104/65,  pulse 76, temperature 99.4 F (37.4 C), temperature source Tympanic, resp. rate 18, height '5\' 4"'$  (1.626 m), weight 179 lb 9 oz (81.4 kg), SpO2 97 %.  Physical Exam  Constitutional: She is oriented to person, place, and time. She appears well-developed and well-nourished. No distress.  She needs assistance onto the exam table.  HENT:  Head: Normocephalic and atraumatic.  Mouth/Throat: Oropharynx is clear and moist. No oropharyngeal exudate.  Short gray hair.  Mask.  Eyes: Pupils are equal, round, and reactive to light. Conjunctivae and EOM are normal. No scleral icterus.  Glasses. Blue eyes.  Neck: Normal range of motion. Neck supple. No JVD present.  Cardiovascular: Normal rate and normal heart sounds.  No murmur heard. Pulmonary/Chest: Effort normal and breath sounds normal. No respiratory distress. She has no wheezes. She has no rales. She exhibits no tenderness.  LEFT breast from 10 o'clock - 1 o'clock with significant post-operative scarring and edema. RIGHT breast with scattered fibrocystic changes.  Abdominal: Soft. Bowel sounds are normal. She exhibits no distension. There is no abdominal tenderness.  Musculoskeletal: Normal range of motion.        General: Tenderness (right ankle) present. No edema.     Comments: LUE lymphedema sleeve.  Lymphadenopathy:    She has no cervical adenopathy.    She has no axillary adenopathy.       Right: No supraclavicular adenopathy present.       Left: No supraclavicular adenopathy present.  Neurological: She is alert and oriented to person, place, and time. She has normal reflexes.  Skin: Skin is warm and dry. No rash noted. She is not diaphoretic. No erythema. No pallor.  Psychiatric: She has a normal mood and affect. Her behavior is normal. Judgment and thought content normal.  Nursing note and vitals reviewed.   Appointment on 09/24/2019  Component Date Value Ref Range Status  . Sodium 09/24/2019 138  135 - 145 mmol/L Final  . Potassium  09/24/2019 4.4  3.5 - 5.1 mmol/L Final  . Chloride 09/24/2019 106  98 - 111 mmol/L Final  . CO2 09/24/2019 28  22 - 32 mmol/L Final  . Glucose, Bld 09/24/2019 93  70 - 99 mg/dL Final  . BUN 09/24/2019 31* 8 - 23 mg/dL Final  . Creatinine, Ser 09/24/2019 1.86* 0.44 - 1.00 mg/dL Final  . Calcium 09/24/2019 9.6  8.9 - 10.3 mg/dL Final  . Total Protein 09/24/2019 6.6  6.5 - 8.1 g/dL Final  . Albumin 09/24/2019 3.9  3.5 - 5.0 g/dL Final  . AST 09/24/2019 41  15 - 41 U/L Final  . ALT 09/24/2019 32  0 - 44 U/L Final  . Alkaline Phosphatase 09/24/2019 68  38 - 126 U/L Final  . Total Bilirubin 09/24/2019 0.9  0.3 - 1.2 mg/dL Final  . GFR calc non Af Amer 09/24/2019 25* >60 mL/min Final  . GFR calc Af Amer 09/24/2019 29* >60 mL/min Final  . Anion gap 09/24/2019 4* 5 - 15 Final   Performed at [facility], [address], [place], [place] [address]  . WBC 09/24/2019 5.3  4.0 - 10.5 K/uL Final  . RBC 09/24/2019 3.77* 3.87 - 5.11 MIL/uL Final  . Hemoglobin 09/24/2019 12.4  12.0 - 15.0 g/dL Final  . HCT 09/24/2019 35.7* 36.0 - 46.0 % Final  . MCV 09/24/2019 94.7  80.0 - 100.0 fL Final  . MCH 09/24/2019 32.9  26.0 - 34.0 pg Final  . MCHC 09/24/2019 34.7  30.0 - 36.0 g/dL Final  . RDW 09/24/2019 12.4  11.5 - 15.5 % Final  . Platelets 09/24/2019 146* 150 - 400 K/uL Final  . nRBC 09/24/2019 0.0  0.0 - 0.2 % Final  . Neutrophils Relative % 09/24/2019 59  % Final  . Neutro Abs 09/24/2019 3.2  1.7 - 7.7 K/uL Final  . Lymphocytes Relative 09/24/2019 31  % Final  . Lymphs Abs 09/24/2019 1.7  0.7 - 4.0 K/uL Final  . Monocytes Relative 09/24/2019 7  % Final  . Monocytes Absolute 09/24/2019 0.4  0.1 - 1.0 K/uL Final  . Eosinophils Relative 09/24/2019 2  % Final  . Eosinophils Absolute 09/24/2019 0.1  0.0 - 0.5 K/uL Final  . Basophils Relative 09/24/2019 1  % Final  . Basophils Absolute 09/24/2019 0.0  0.0 - 0.1 K/uL Final  . Immature Granulocytes 09/24/2019 0  % Final  . Abs Immature  Granulocytes 09/24/2019 0.01  0.00 - 0.07 K/uL Final   Performed at [facility], [address], [place], [place] [address]    Assessment:  Hailey Ellison is a 84 y.o. female with stage II left breast cancer s/p wide excision on 03/09/2009.  Pathology revealed a 1.2 cm grade III invasive ductal carcinoma with micropapillary features.  One of 2 sentinel lymph nodes and 5 of 13 axillary lymph nodes were positive.  The size of the largest metastatic deposit was 1.2 cm There was lymph-vascular invasion. Estrogen receptor was positive (greater than 90%), progesterone receptor positive (60%) and HER-2/neu 2+ by IHC and equivocal by FISH.  She was treated with 6 cycles of [place] followed by a year of adjuvant Herceptin (last 05/01/2010).  She received radiation following completion of [place].  She began Arimidex in 07/2010.  Bilateral mammogram on 05/08/2017 revealed no evidence of malignancy in either breast.  Left breast ultrasound on 05/08/2017 revealed lumpectomy changes in the superior aspect of the left breast with fat necrosis.  There was no focal fluid collection. There was minimal drainage from the lumpectomy scar.  She underwent left partial mastectomy on 07/26/2017.  Pathology revealed organized fat necrosis with calcification.  Bilateral mammogram on 05/09/2018 revealed no mammographic evidence of malignancy in either breast s/p left lumpectomy.  Bilateral mammogram on 07/07/2019 showed no evidence of malignancy.   CA 27-29 has been followed: 18.1 on 12/04/2013, 27.7 on 08/16/2014, 24.4 on  09/02/2015, 27.7 on 08/31/2016, 18.5 on 09/02/2017, 21.2 on 09/01/2018, and 28.7 on 09/24/2019.   Bone density on 09/14/2015 revealed osteopenia with a T score -1.8 in the left femoral neck.  Bone density on 09/25/2018 revealed osteopenia with a T-score on -1.9 at the left neck femur.  She began Prolia twice a year (last received 10/14/2018).   The patient has kidney disease. She is followed by  Dr.Stoioff for a right kidney mass. He notes that since last being seen she had a myocardial infarction in 05/2016. She had cellulitis in 08/2016.   She has a history of renal cell carcinoma of the right kidney s/p cryoablation on 11/25/2014.  Symptomatically, she notes right neck pain on movement.  Exam reveals significant post-operative changes without discrete mass.  Plan: 1.   Labs today: CBC with diff, CMP, CA 27.29. 2.   Stage II left breast cancer             Clinically, she is doing well.  Exam reveals no evidence of recurrent disease.  Review bilateral mammogram on 07/07/2019 - no evidence of malignancy.             Review inability to perform BCI testing with plan to continue endocrine therapy x 10 years.             Continue Arimidex. 3.   Osteopenia             Bone density on 09/25/2018 revealed osteopenia with a T-score on -1.9 at the left neck femur.              Patient has received Prolia (last 10/14/2018).             Creatinine 1.63 on 10/14/2018 and 1.86 on 09/24/2019.  Discuss with Dr Juleen China. 4.   Right kidney mass             s/p cryoablation by Dr. Jule Ser at [facility].  Encourage follow-up at [facility] with Dr Fenton Malling re: kidney lesion. 5.   RTC in 1 year for MD assessment and labs (CBC with diff, CMP, CA27.29).   I discussed the assessment and treatment plan with the patient.  The patient was provided an opportunity to ask questions and all were answered.  The patient agreed with the plan and demonstrated an understanding of the instructions.  The patient was advised to call back if the symptoms worsen or if the condition fails to improve as anticipated.  I provided 31 minutes of face-to-face time during this this encounter and > 50% was spent counseling as documented under my assessment and plan.    Lequita Asal, MD, PhD    09/24/2019, 2:32 PM  I, Selena Batten, am acting as scribe for [employer]. Mike Gip, MD, PhD.  I, Melissa C. Mike Gip, MD, have  reviewed the above documentation for accuracy and completeness, and I agree with the above.

## 2019-09-24 ENCOUNTER — Inpatient Hospital Stay (HOSPITAL_BASED_OUTPATIENT_CLINIC_OR_DEPARTMENT_OTHER): Payer: Medicare Other | Admitting: Hematology and Oncology

## 2019-09-24 ENCOUNTER — Other Ambulatory Visit: Payer: Self-pay

## 2019-09-24 ENCOUNTER — Inpatient Hospital Stay: Payer: Medicare Other | Attending: Hematology and Oncology

## 2019-09-24 ENCOUNTER — Encounter: Payer: Self-pay | Admitting: Hematology and Oncology

## 2019-09-24 VITALS — BP 104/65 | HR 76 | Temp 99.4°F | Resp 18 | Ht 64.0 in | Wt 179.6 lb

## 2019-09-24 DIAGNOSIS — Z170 Estrogen receptor positive status [ER+]: Secondary | ICD-10-CM | POA: Insufficient documentation

## 2019-09-24 DIAGNOSIS — C50912 Malignant neoplasm of unspecified site of left female breast: Secondary | ICD-10-CM | POA: Diagnosis present

## 2019-09-24 DIAGNOSIS — M8580 Other specified disorders of bone density and structure, unspecified site: Secondary | ICD-10-CM | POA: Insufficient documentation

## 2019-09-24 DIAGNOSIS — Z79811 Long term (current) use of aromatase inhibitors: Secondary | ICD-10-CM | POA: Insufficient documentation

## 2019-09-24 LAB — CBC WITH DIFFERENTIAL/PLATELET
Abs Immature Granulocytes: 0.01 10*3/uL (ref 0.00–0.07)
Basophils Absolute: 0 10*3/uL (ref 0.0–0.1)
Basophils Relative: 1 %
Eosinophils Absolute: 0.1 10*3/uL (ref 0.0–0.5)
Eosinophils Relative: 2 %
HCT: 35.7 % — ABNORMAL LOW (ref 36.0–46.0)
Hemoglobin: 12.4 g/dL (ref 12.0–15.0)
Immature Granulocytes: 0 %
Lymphocytes Relative: 31 %
Lymphs Abs: 1.7 10*3/uL (ref 0.7–4.0)
MCH: 32.9 pg (ref 26.0–34.0)
MCHC: 34.7 g/dL (ref 30.0–36.0)
MCV: 94.7 fL (ref 80.0–100.0)
Monocytes Absolute: 0.4 10*3/uL (ref 0.1–1.0)
Monocytes Relative: 7 %
Neutro Abs: 3.2 10*3/uL (ref 1.7–7.7)
Neutrophils Relative %: 59 %
Platelets: 146 10*3/uL — ABNORMAL LOW (ref 150–400)
RBC: 3.77 MIL/uL — ABNORMAL LOW (ref 3.87–5.11)
RDW: 12.4 % (ref 11.5–15.5)
WBC: 5.3 10*3/uL (ref 4.0–10.5)
nRBC: 0 % (ref 0.0–0.2)

## 2019-09-24 LAB — COMPREHENSIVE METABOLIC PANEL
ALT: 32 U/L (ref 0–44)
AST: 41 U/L (ref 15–41)
Albumin: 3.9 g/dL (ref 3.5–5.0)
Alkaline Phosphatase: 68 U/L (ref 38–126)
Anion gap: 4 — ABNORMAL LOW (ref 5–15)
BUN: 31 mg/dL — ABNORMAL HIGH (ref 8–23)
CO2: 28 mmol/L (ref 22–32)
Calcium: 9.6 mg/dL (ref 8.9–10.3)
Chloride: 106 mmol/L (ref 98–111)
Creatinine, Ser: 1.86 mg/dL — ABNORMAL HIGH (ref 0.44–1.00)
GFR calc Af Amer: 29 mL/min — ABNORMAL LOW (ref >60)
GFR calc non Af Amer: 25 mL/min — ABNORMAL LOW (ref >60)
Glucose, Bld: 93 mg/dL (ref 70–99)
Potassium: 4.4 mmol/L (ref 3.5–5.1)
Sodium: 138 mmol/L (ref 135–145)
Total Bilirubin: 0.9 mg/dL (ref 0.3–1.2)
Total Protein: 6.6 g/dL (ref 6.5–8.1)

## 2019-09-24 NOTE — Progress Notes (Signed)
Patient c/o right neck pain/ which increase with movement

## 2019-09-25 LAB — CANCER ANTIGEN 27.29: CA 27.29: 28.7 U/mL (ref 0.0–38.6)

## 2019-10-08 ENCOUNTER — Ambulatory Visit: Payer: Medicare Other | Admitting: Podiatry

## 2020-02-17 IMAGING — CR LEFT FOOT - COMPLETE 3+ VIEW
3 series · 4 of 4 positions shown · non-contrast
Comparison: None.

CLINICAL DATA: 83-year-old female with 4th and 5th metatarsal pain
for 4 days with no known injury. Osteoporosis.

EXAM:
LEFT FOOT - COMPLETE 3+ VIEW

[foot ap]
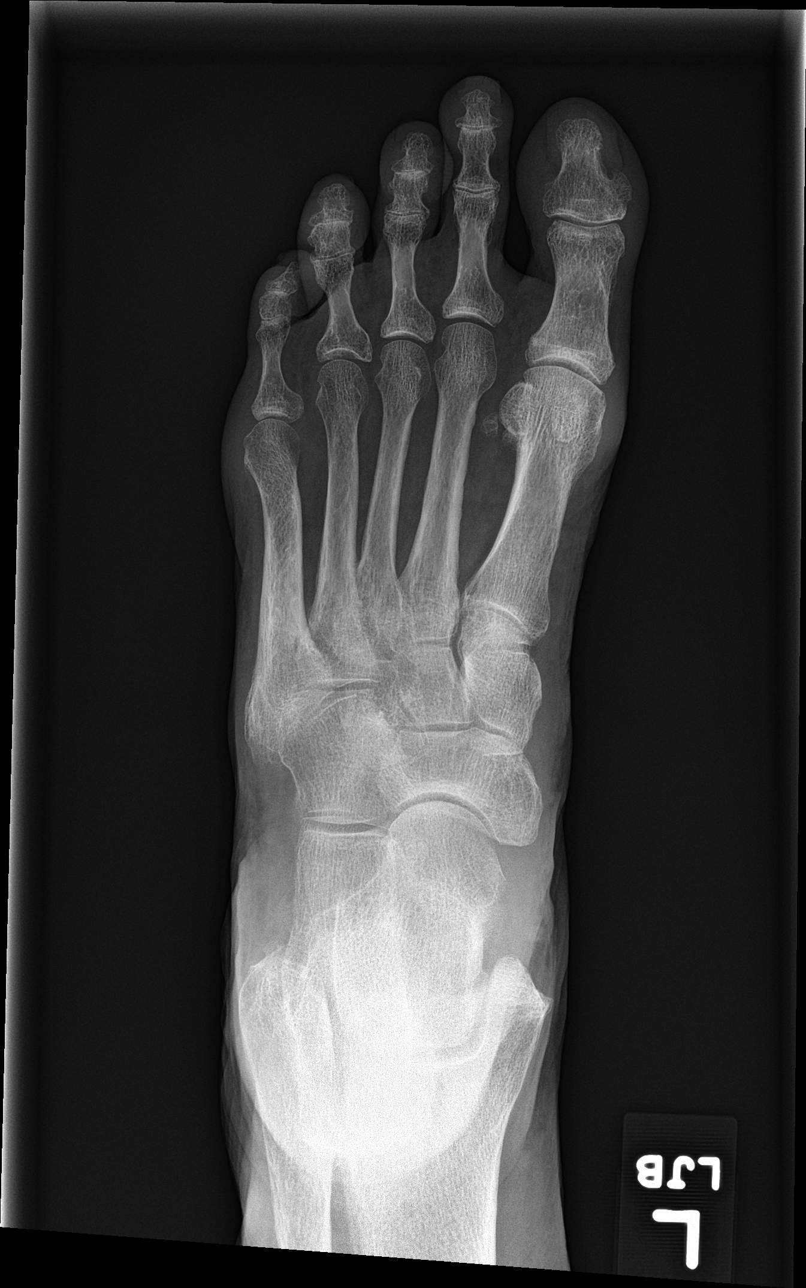

[foot obl]
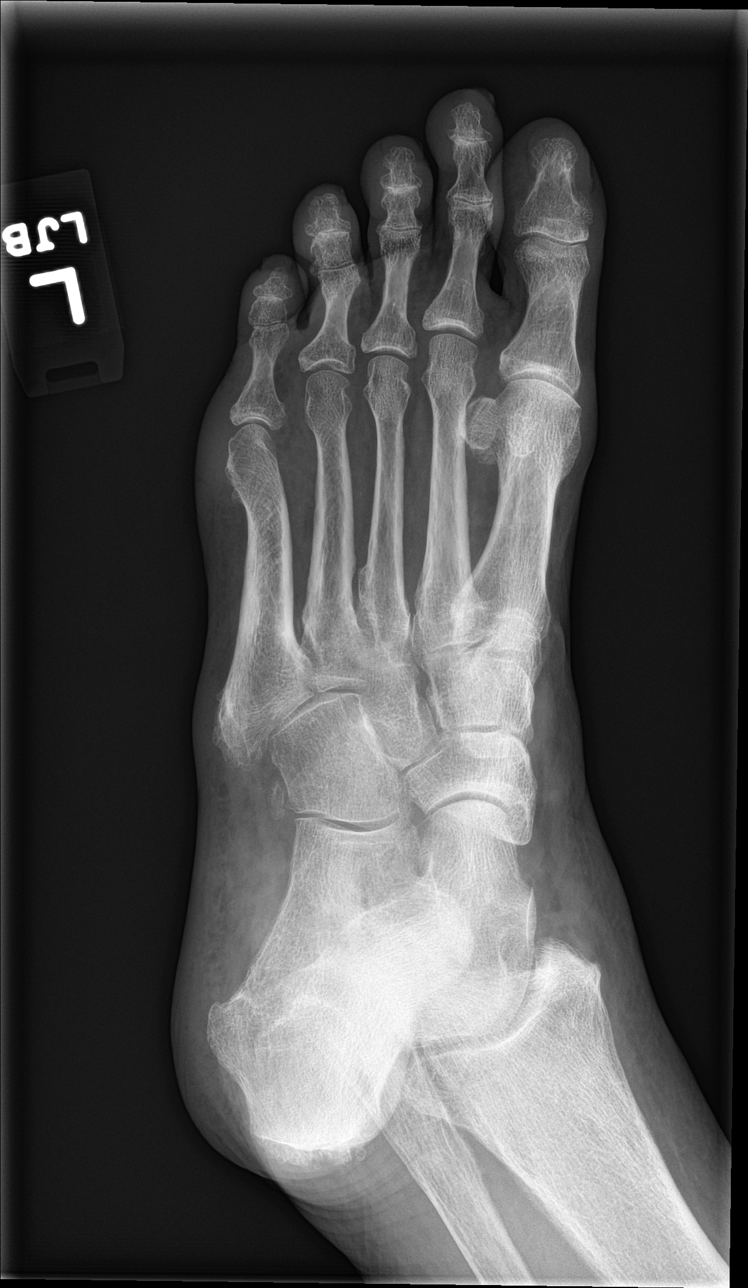

[Series 3: foot lat · 0.14mm/px · 2 of 2 slices shown]
[im 1/2]
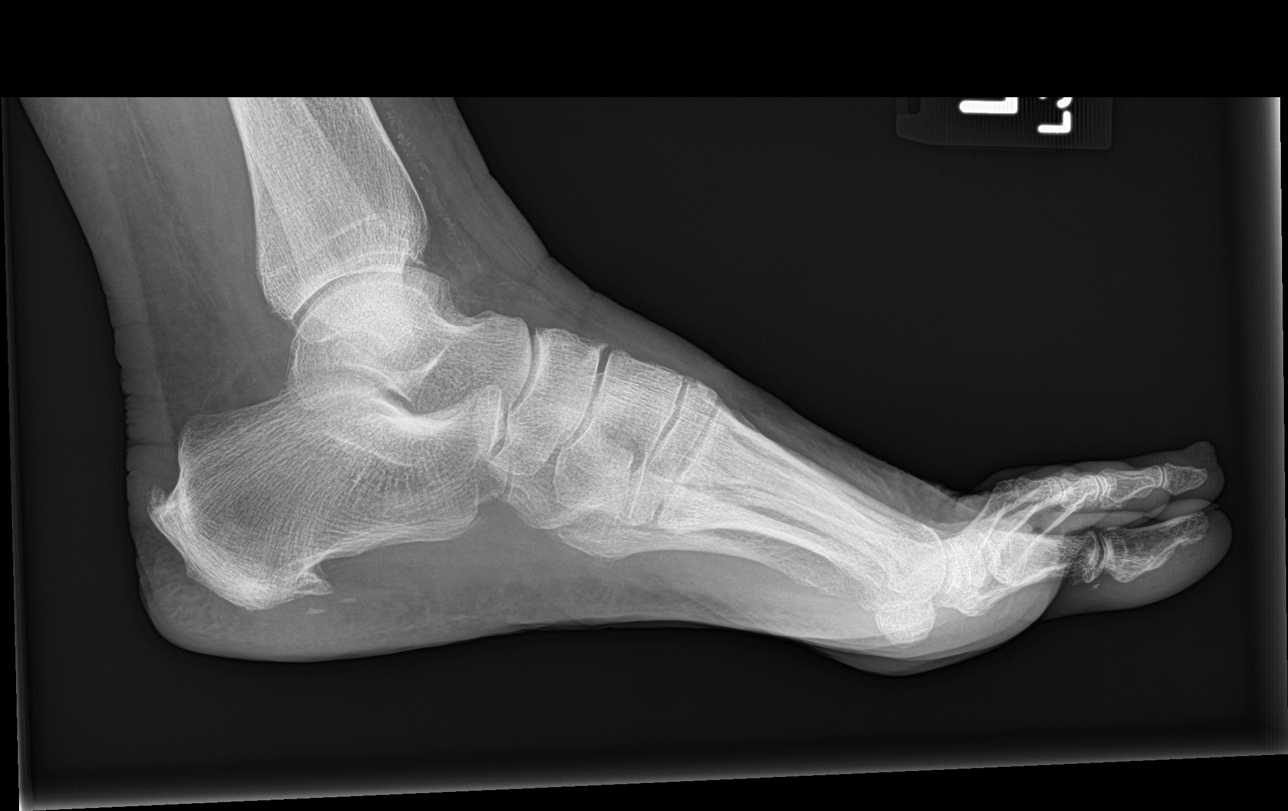
[im 2/2]
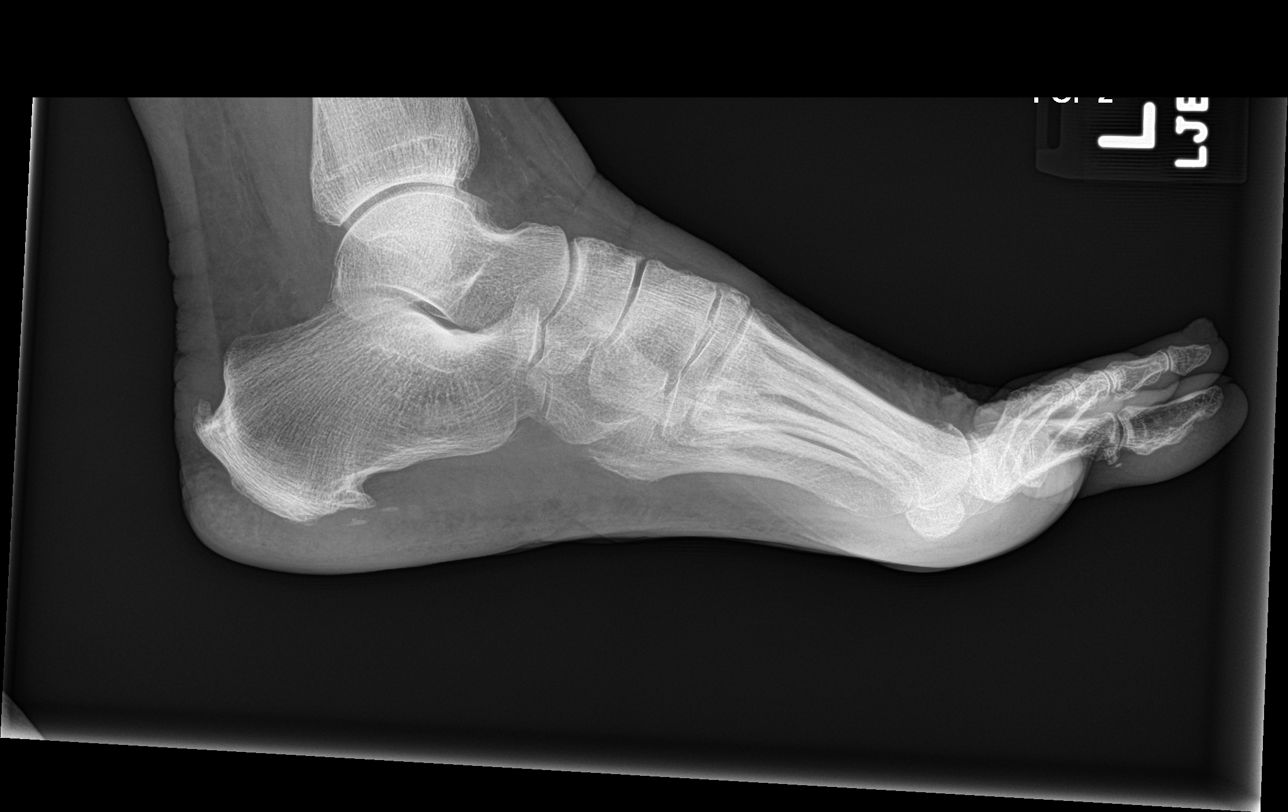

[4 of 4 positions shown; findings below may reference images not displayed]

FINDINGS: Calcified peripheral vascular disease. Bone mineralization is within
normal limits for age. Mild degenerative spurring in the tarsal
bones and at the 1st MTP, but only mild associated joint space loss.
No fracture or periosteal reaction identified. Degenerative spurring
at the calcaneus.
IMPRESSION: No acute osseous abnormality identified. Follow-up radiographs are
recommended if symptoms persist.

Calcified peripheral vascular disease.

## 2020-03-17 ENCOUNTER — Encounter: Payer: Self-pay | Admitting: Podiatry

## 2020-03-17 ENCOUNTER — Ambulatory Visit (INDEPENDENT_AMBULATORY_CARE_PROVIDER_SITE_OTHER): Payer: Medicare Other | Admitting: Podiatry

## 2020-03-17 ENCOUNTER — Other Ambulatory Visit: Payer: Self-pay

## 2020-03-17 DIAGNOSIS — M79676 Pain in unspecified toe(s): Secondary | ICD-10-CM | POA: Diagnosis not present

## 2020-03-17 DIAGNOSIS — D689 Coagulation defect, unspecified: Secondary | ICD-10-CM | POA: Diagnosis not present

## 2020-03-17 DIAGNOSIS — B351 Tinea unguium: Secondary | ICD-10-CM

## 2020-03-17 DIAGNOSIS — I890 Lymphedema, not elsewhere classified: Secondary | ICD-10-CM | POA: Diagnosis not present

## 2020-03-17 NOTE — Progress Notes (Signed)
This patient returns to my office for at risk foot care.  This patient requires this care by a professional since this patient will be at risk due to having lymphedma and coagulation defect.  This patient is unable to cut nails herself since the patient cannot reach her nails.These nails are painful walking and wearing shoes.  This patient presents for at risk foot care today.  General Appearance  Alert, conversant and in no acute stress.  Vascular  Dorsalis pedis and posterior tibial  pulses are palpable  bilaterally.  Capillary return is within normal limits  bilaterally. Temperature is within normal limits  bilaterally.  Neurologic  Senn-Weinstein monofilament wire test within normal limits  bilaterally. Muscle power within normal limits bilaterally.  Nails Thick disfigured discolored nails with subungual debris  from hallux to fifth toes bilaterally. No evidence of bacterial infection or drainage bilaterally.  Orthopedic  No limitations of motion  feet .  No crepitus or effusions noted.  No bony pathology or digital deformities noted.  Skin  normotropic skin with no porokeratosis noted bilaterally.  No signs of infections or ulcers noted.     Onychomycosis  Pain in right toes  Pain in left toes  Consent was obtained for treatment procedures.   Mechanical debridement of nails 1-5  bilaterally performed with a nail nipper.  Filed with dremel without incident.    Return office visit   3 months                   Told patient to return for periodic foot care and evaluation due to potential at risk complications.   Jayesh Marbach DPM  

## 2020-06-07 ENCOUNTER — Other Ambulatory Visit: Payer: Self-pay | Admitting: General Surgery

## 2020-06-07 DIAGNOSIS — Z1211 Encounter for screening for malignant neoplasm of colon: Secondary | ICD-10-CM

## 2020-06-07 DIAGNOSIS — C50912 Malignant neoplasm of unspecified site of left female breast: Secondary | ICD-10-CM

## 2020-06-07 DIAGNOSIS — Z853 Personal history of malignant neoplasm of breast: Secondary | ICD-10-CM

## 2020-06-09 ENCOUNTER — Other Ambulatory Visit: Payer: Self-pay | Admitting: General Surgery

## 2020-06-09 DIAGNOSIS — Z1231 Encounter for screening mammogram for malignant neoplasm of breast: Secondary | ICD-10-CM

## 2020-06-20 ENCOUNTER — Ambulatory Visit (INDEPENDENT_AMBULATORY_CARE_PROVIDER_SITE_OTHER): Payer: Medicare Other | Admitting: Podiatry

## 2020-06-20 ENCOUNTER — Other Ambulatory Visit: Payer: Self-pay

## 2020-06-20 ENCOUNTER — Encounter: Payer: Self-pay | Admitting: Podiatry

## 2020-06-20 DIAGNOSIS — I890 Lymphedema, not elsewhere classified: Secondary | ICD-10-CM | POA: Diagnosis not present

## 2020-06-20 DIAGNOSIS — B351 Tinea unguium: Secondary | ICD-10-CM

## 2020-06-20 DIAGNOSIS — M79676 Pain in unspecified toe(s): Secondary | ICD-10-CM

## 2020-06-20 DIAGNOSIS — D689 Coagulation defect, unspecified: Secondary | ICD-10-CM

## 2020-06-20 NOTE — Progress Notes (Signed)
This patient returns to my office for at risk foot care.  This patient requires this care by a professional since this patient will be at risk due to having lymphedma and coagulation defect.  This patient is unable to cut nails herself since the patient cannot reach her nails.These nails are painful walking and wearing shoes.  This patient presents for at risk foot care today.  General Appearance  Alert, conversant and in no acute stress.  Vascular  Dorsalis pedis and posterior tibial  pulses are palpable  bilaterally.  Capillary return is within normal limits  bilaterally. Temperature is within normal limits  bilaterally.  Neurologic  Senn-Weinstein monofilament wire test within normal limits  bilaterally. Muscle power within normal limits bilaterally.  Nails Thick disfigured discolored nails with subungual debris  from hallux to fifth toes bilaterally. No evidence of bacterial infection or drainage bilaterally.  Orthopedic  No limitations of motion  feet .  No crepitus or effusions noted.  No bony pathology or digital deformities noted.  Skin  normotropic skin with no porokeratosis noted bilaterally.  No signs of infections or ulcers noted.     Onychomycosis  Pain in right toes  Pain in left toes  Consent was obtained for treatment procedures.   Mechanical debridement of nails 1-5  bilaterally performed with a nail nipper.  Filed with dremel without incident.    Return office visit   3 months                   Told patient to return for periodic foot care and evaluation due to potential at risk complications.   Gardiner Barefoot DPM

## 2020-08-05 ENCOUNTER — Ambulatory Visit
Admission: RE | Admit: 2020-08-05 | Discharge: 2020-08-05 | Disposition: A | Discharge status: Home / Self Care | Payer: Medicare Other | Source: Ambulatory Visit | Attending: General Surgery | Admitting: General Surgery

## 2020-08-05 ENCOUNTER — Other Ambulatory Visit: Payer: Self-pay

## 2020-08-05 DIAGNOSIS — Z1231 Encounter for screening mammogram for malignant neoplasm of breast: Secondary | ICD-10-CM | POA: Insufficient documentation

## 2020-08-05 HISTORY — DX: Personal history of irradiation: Z92.3

## 2020-08-09 ENCOUNTER — Other Ambulatory Visit: Payer: Self-pay | Admitting: General Surgery

## 2020-08-09 DIAGNOSIS — Z853 Personal history of malignant neoplasm of breast: Secondary | ICD-10-CM

## 2020-08-09 DIAGNOSIS — N63 Unspecified lump in unspecified breast: Secondary | ICD-10-CM

## 2020-08-09 DIAGNOSIS — N6320 Unspecified lump in the left breast, unspecified quadrant: Secondary | ICD-10-CM

## 2020-08-11 ENCOUNTER — Other Ambulatory Visit: Payer: Self-pay | Admitting: General Surgery

## 2020-08-11 DIAGNOSIS — I890 Lymphedema, not elsewhere classified: Secondary | ICD-10-CM

## 2020-08-11 DIAGNOSIS — C50412 Malignant neoplasm of upper-outer quadrant of left female breast: Secondary | ICD-10-CM

## 2020-08-11 DIAGNOSIS — Z1231 Encounter for screening mammogram for malignant neoplasm of breast: Secondary | ICD-10-CM

## 2020-08-11 NOTE — Progress Notes (Signed)
img 

## 2020-08-21 ENCOUNTER — Encounter: Payer: Self-pay | Admitting: Emergency Medicine

## 2020-08-21 ENCOUNTER — Ambulatory Visit
Admission: EM | Admit: 2020-08-21 | Discharge: 2020-08-21 | Disposition: A | Discharge status: Home / Self Care | Payer: Medicare Other | Attending: Family Medicine | Admitting: Family Medicine

## 2020-08-21 ENCOUNTER — Other Ambulatory Visit: Payer: Self-pay

## 2020-08-21 DIAGNOSIS — L03116 Cellulitis of left lower limb: Secondary | ICD-10-CM

## 2020-08-21 MED ORDER — MUPIROCIN 2 % EX OINT: 1.0000 "application " | TOPICAL_OINTMENT | Freq: Two times a day (BID) | CUTANEOUS | 0 refills | Status: AC

## 2020-08-21 MED ORDER — DOXYCYCLINE HYCLATE 100 MG PO CAPS: 100.0000 mg | ORAL_CAPSULE | Freq: Two times a day (BID) | ORAL | 0 refills | Status: AC

## 2020-08-21 NOTE — ED Triage Notes (Signed)
Patient c/o redness, swelling and itching in her right foot and toes that started today.  Patient denies injury or fall.

## 2020-08-21 NOTE — Discharge Instructions (Signed)
If you worsen, go to the ER. ° °Take care ° °Dr. Kaelah Hayashi  °

## 2020-08-21 NOTE — ED Provider Notes (Signed)
[facility]    CSN: [id number] Arrival date & time: 08/21/20  1523      History   Chief Complaint Chief Complaint  Patient presents with  . Foot Pain   HPI  85 year old female presents with the above complaint.  Patient reports that she has had redness and swelling as well as some itching of her left foot which just started today.  She just noticed it.  She has some pustules noted on the dorsum of her foot near the toes.  She also has a blister.  She is unsure of any inciting event.  No relieving factors.  Mild itching.  Pain 9/10 in severity.  No relieving factors.  No other complaints.  Past Medical History:  Diagnosis Date  . Anxiety   . Arthritis   . Breast cancer ([place]) 2010   Adjuvant chemotherapy with Taxotere/Carboplatin/Herceptin. Arimidex initiated November 2011  . Cancer ([place]) 2010   T1c,N2a, M0; ER 90%, PR 60%,  HER-2/neu 2+ IHC, equivocal on fish. 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  Margins clear.  . Chronic kidney disease 2014  . Depression   . GERD (gastroesophageal reflux disease)   . Heart attack ([place]) 06/09/2016  . Hypertension   . Lymphedema    left arm  . Murmur   . Osteoporosis   . Personal history of chemotherapy   . Personal history of radiation therapy   . Radiation 2010   with chemo  . Squamous cell carcinoma June 2016   Left upper inner chest wall, area of previous radiation.  . Stroke ([place])    per pt, TIA a long time ago.  . Tinnitus     Patient Active Problem List   Diagnosis Date Noted  . Aortic stenosis 04/21/2019  . Osteopenia 09/07/2017  . ST elevation myocardial infarction (STEMI) of inferior wall ([place]) 06/08/2016  . ST elevation myocardial infarction involving right coronary artery ([place])   . Fat necrosis (segmental) of breast 07/26/2015  . Squamous cell carcinoma in situ 05/25/2015  .  Lymphedema 08/05/2014  . Personal history of malignant neoplasm of breast 05/25/2014  . Breast cancer, stage 2 ([place]) 04/08/2013    Past Surgical History:  Procedure Laterality Date  . ABDOMINAL HYSTERECTOMY    . BREAST BIOPSY Left 2010   DCIS  . BREAST BIOPSY Right    neg  . BREAST CYST EXCISION    . BREAST EXCISIONAL BIOPSY Left 07/26/2017   neg/fat necrosis  . BREAST LUMPECTOMY Left 2010  . CARDIAC CATHETERIZATION N/A 06/08/2016   Procedure: Left Heart Cath and Coronary Angiography;  Surgeon: Wellington Hampshire, MD;  Location: [facility];  Service: Cardiovascular;  Laterality: N/A;  . CARDIAC CATHETERIZATION N/A 06/08/2016   Procedure: Coronary Stent Intervention;  Surgeon: Wellington Hampshire, MD;  Location: [facility];  Service: Cardiovascular;  Laterality: N/A;  . CARPAL TUNNEL RELEASE Bilateral   . CHOLECYSTECTOMY    . JOINT REPLACEMENT Left 2008   Total Knee Replacement  . MASTECTOMY, PARTIAL Left 07/26/2017   Procedure: MASTECTOMY PARTIAL;  Surgeon: Robert Bellow, MD;  Location: [facility];  Service: General;  Laterality: Left;  . PARTIAL HYSTERECTOMY  1980's  . PORT-A-CATH REMOVAL    . RIGHT/LEFT HEART CATH AND CORONARY ANGIOGRAPHY N/A 04/23/2019   Procedure: RIGHT/LEFT HEART CATH AND CORONARY ANGIOGRAPHY;  Surgeon: Corey Skains, MD;  Location: [facility];  Service: Cardiovascular;  Laterality: N/A;    OB History    Gravida  2   Para      Term      Preterm      AB      Living  2     SAB      TAB      Ectopic      Multiple      Live Births           Obstetric Comments  Age first menstrual period 25 Age first pregnancy 31         Home Medications    Prior to Admission medications   Medication Sig Start Date End Date Taking? Authorizing Provider  amLODipine (NORVASC) 5 MG tablet Take by mouth. 06/03/19 08/21/20 Yes [provider]  aspirin 81 MG chewable tablet Chew 1 tablet (81 mg total) by mouth daily.  06/10/16  Yes Demetrios Loll, MD  ergocalciferol (VITAMIN D2) 50000 units capsule Take 50,000 Units by mouth every 30 (thirty) days.    Yes [provider]  gabapentin (NEURONTIN) 300 MG capsule 2 Capsules in the am and 3 capsules at night 06/23/19  Yes [provider]  lansoprazole (PREVACID) 30 MG capsule Take 30 mg by mouth daily.   Yes [provider]  losartan (COZAAR) 50 MG tablet Take 50 mg by mouth daily.   Yes [provider]  Multiple Vitamin (MULTI-VITAMIN) tablet Take by mouth.   Yes [provider]  PARoxetine (PAXIL) 40 MG tablet Take 40 mg by mouth at bedtime.    Yes [provider]  rosuvastatin (CRESTOR) 20 MG tablet Take 20 mg by mouth daily.   Yes [provider]  vitamin B-12 (CYANOCOBALAMIN) 500 MCG tablet Take 500 mcg by mouth daily.   Yes [provider]  acetaminophen (TYLENOL) 325 MG tablet Take by mouth. 05/28/18   [provider]  anastrozole (ARIMIDEX) 1 MG tablet TAKE 1 TABLET DAILY Patient taking differently: Take 1 mg by mouth daily.  04/13/19   Robert Bellow, MD  doxycycline (VIBRAMYCIN) 100 MG capsule Take 1 capsule (100 mg total) by mouth 2 (two) times daily. 08/21/20   Coral Spikes, DO  furosemide (LASIX) 20 MG tablet Take 20 mg by mouth daily as needed for fluid.     [provider]  Glucosamine-Chondroit-Vit C-Mn (GLUCOSAMINE 1500 COMPLEX PO) Take 2 tablets by mouth daily.    [provider]  Multiple Vitamin (MULTIVITAMIN WITH MINERALS) TABS Take 1 tablet by mouth daily.    [provider]  mupirocin ointment (BACTROBAN) 2 % Apply 1 application topically 2 (two) times daily for 7 days. 08/21/20 08/28/20  Coral Spikes, DO  senna-docusate (SENOKOT-S) 8.6-50 MG tablet Take by mouth. 06/03/19   [provider]  calcium citrate-vitamin D (CITRACAL+D) 315-200 MG-UNIT per tablet Take 1 tablet by mouth 2 (two) times daily.  08/21/20  [provider]     Family History Family History  Problem Relation Age of Onset  . Cancer Sister        breast  . Cancer Sister        kidney  . Hypertension Mother   . Hypertension Father   . Breast cancer Neg Hx     Social History Social History   Tobacco Use  . Smoking status: Former Smoker    Types: Cigarettes  . Smokeless tobacco: Never Used  . Tobacco comment: only smoked a few cigs  throughout her lifetime  Vaping Use  . Vaping Use: Never used  Substance Use Topics  . Alcohol use: No    Alcohol/week: 0.0 standard drinks  . Drug use: No     Allergies   Alendronate, Benadryl [diphenhydramine hcl], Tegaderm ag mesh [silver], Bacitracin-polymyxin b, Bacitracin-neomycin-polymyxin, Neosporin [neomycin-bacitracin zn-polymyx], Sulfa antibiotics, and Tape   Review of Systems Review of Systems  Constitutional: Negative.   Skin:       Left foot redness, pain.   Physical Exam Triage Vital Signs ED Triage Vitals  Enc Vitals Group     BP 08/21/20 1553 (S) (!) 200/92     Pulse Rate 08/21/20 1553 60     Resp 08/21/20 1553 14     Temp 08/21/20 1553 98.1 F (36.7 C)     Temp Source 08/21/20 1553 Oral     SpO2 08/21/20 1553 99 %     Weight 08/21/20 1549 172 lb (78 kg)     Height 08/21/20 1549 _0  (1.651 m)     Head Circumference --      Peak Flow --      Pain Score 08/21/20 1549 9     Pain Loc --      Pain Edu? --      Excl. in [place]? --    Updated Vital Signs BP (S) (!) 200/92 (BP Location: Right Arm)   Pulse 60   Temp 98.1 F (36.7 C) (Oral)   Resp 14   Ht _1  (1.651 m)   Wt 78 kg   SpO2 99%   BMI 28.62 kg/m   Visual Acuity Right Eye Distance:   Left Eye Distance:   Bilateral Distance:    Right Eye Near:   Left Eye Near:    Bilateral Near:     Physical Exam Vitals and nursing note reviewed.  Constitutional:      General: She is not in acute distress.    Appearance: Normal appearance. She is not ill-appearing.  HENT:     Head: Normocephalic and  atraumatic.  Eyes:     General:        Right eye: No discharge.        Left eye: No discharge.     Conjunctiva/sclera: Conjunctivae normal.  Pulmonary:     Effort: Pulmonary effort is normal. No respiratory distress.  Musculoskeletal:       Feet:  Feet:     Comments: Erythema, warmth. There are a few pustules. Neurological:     Mental Status: She is alert.  Psychiatric:        Mood and Affect: Mood normal.        Behavior: Behavior normal.    UC Treatments / Results  Labs (all labs ordered are listed, but only abnormal results are displayed) Labs Reviewed - No data to display  EKG   Radiology No results found.  Procedures Procedures (including critical care time)  Medications Ordered in UC Medications - No data to display  Initial Impression / Assessment and Plan / UC Course  I have reviewed the triage vital signs and the nursing notes.  Pertinent labs & imaging results that were available during my care of the patient were reviewed by me and considered in my medical decision making (see chart for details).    85 year old female presents with cellulitis to left lower extremity.  On exam, there are some pustules as well as erythema and warmth.  Treating with doxycycline and Bactroban ointment.  Final Clinical Impressions(s) /  UC Diagnoses   Final diagnoses:  Cellulitis of left lower extremity     Discharge Instructions     If you worsen, go to the ER.  Take care  Dr. Lacinda Axon    ED Prescriptions    Medication Sig Dispense Auth. Provider   doxycycline (VIBRAMYCIN) 100 MG capsule Take 1 capsule (100 mg total) by mouth 2 (two) times daily. 14 capsule Camellia Popescu G, DO   mupirocin ointment (BACTROBAN) 2 % Apply 1 application topically 2 (two) times daily for 7 days. 22 g Coral Spikes, DO     PDMP not reviewed this encounter.   Coral Spikes, [place] 08/21/20 1636

## 2020-08-23 ENCOUNTER — Ambulatory Visit
Admission: RE | Admit: 2020-08-23 | Discharge: 2020-08-23 | Disposition: A | Discharge status: Home / Self Care | Payer: Medicare Other | Source: Ambulatory Visit | Attending: General Surgery | Admitting: General Surgery

## 2020-08-23 ENCOUNTER — Other Ambulatory Visit: Payer: Self-pay

## 2020-08-23 DIAGNOSIS — C50412 Malignant neoplasm of upper-outer quadrant of left female breast: Secondary | ICD-10-CM | POA: Diagnosis present

## 2020-08-23 DIAGNOSIS — Z1231 Encounter for screening mammogram for malignant neoplasm of breast: Secondary | ICD-10-CM

## 2020-09-22 ENCOUNTER — Ambulatory Visit: Payer: Medicare Other | Admitting: Podiatry

## 2020-09-26 ENCOUNTER — Ambulatory Visit: Payer: Medicare Other | Admitting: Hematology and Oncology

## 2020-09-26 ENCOUNTER — Other Ambulatory Visit: Payer: Medicare Other

## 2020-10-06 ENCOUNTER — Other Ambulatory Visit: Payer: Self-pay

## 2020-10-06 ENCOUNTER — Encounter: Payer: Self-pay | Admitting: Podiatry

## 2020-10-06 ENCOUNTER — Ambulatory Visit (INDEPENDENT_AMBULATORY_CARE_PROVIDER_SITE_OTHER): Payer: Medicare Other | Admitting: Podiatry

## 2020-10-06 DIAGNOSIS — M79676 Pain in unspecified toe(s): Secondary | ICD-10-CM | POA: Diagnosis not present

## 2020-10-06 DIAGNOSIS — B351 Tinea unguium: Secondary | ICD-10-CM

## 2020-10-06 DIAGNOSIS — D689 Coagulation defect, unspecified: Secondary | ICD-10-CM | POA: Diagnosis not present

## 2020-10-06 NOTE — Progress Notes (Signed)
  Subjective:  Patient ID: Hailey Ellison, female    DOB: 1935/02/11,  MRN: [id number]  Chief Complaint  Patient presents with  . Nail Problem    nail trim and RFC   She c/o numbness in both feet mainly at night   85 y.o. female returns for the above complaint.  Patient presents with complaint of thickened elongated dystrophic toenails x10.  Patient is mildly painful to touch.  She states that they are getting so thick she is not able to cut them down herself.  She would like for me to cut them down.  She denies any other acute complaints.   Objective:  There were no vitals filed for this visit. Podiatric Exam: Vascular: dorsalis pedis and posterior tibial pulses are palpable bilateral. Capillary return is immediate. Temperature gradient is WNL. Skin turgor WNL  Sensorium: Normal Semmes Weinstein monofilament test. Normal tactile sensation bilaterally. Nail Exam: Pt has thick disfigured discolored nails with subungual debris noted bilateral entire nail hallux through fifth toenails.  Pain on palpation to the nails. Ulcer Exam: There is no evidence of ulcer or pre-ulcerative changes or infection. Orthopedic Exam: Muscle tone and strength are WNL. No limitations in general ROM. No crepitus or effusions noted. HAV  B/L.  Hammer toes 2-5  B/L. Skin: No Porokeratosis. No infection or ulcers    Assessment & Plan:   1. Pain due to onychomycosis of toenail   2. Coagulation disorder [facility])     Patient was evaluated and treated and all questions answered.  Onychomycosis with pain  -Nails palliatively debrided as below. -Educated on self-care  Procedure: Nail Debridement Rationale: pain  Type of Debridement: manual, sharp debridement. Instrumentation: Nail nipper, rotary burr. Number of Nails: 10  Procedures and Treatment: Consent by patient was obtained for treatment procedures. The patient understood the discussion of treatment and procedures well. All questions were answered thoroughly  reviewed. Debridement of mycotic and hypertrophic toenails, 1 through 5 bilateral and clearing of subungual debris. No ulceration, no infection noted.  Return Visit-Office Procedure: Patient instructed to return to the office for a follow up visit 3 months for continued evaluation and treatment.  Boneta Lucks, DPM    No follow-ups on file.

## 2021-01-10 ENCOUNTER — Ambulatory Visit: Payer: Medicare Other | Admitting: Podiatry

## 2021-01-24 DEATH — deceased
